# Patient Record
Sex: Female | Born: 1955 | Race: White | Hispanic: No | Marital: Single | Smoking: Light tobacco smoker
Health system: Southern US, Community
[De-identification: ages and names within clinical notes are randomized; demographics above are authoritative.]

## PROBLEM LIST (undated history)

## (undated) DIAGNOSIS — F32A Depression, unspecified: Secondary | ICD-10-CM

## (undated) DIAGNOSIS — M542 Cervicalgia: Secondary | ICD-10-CM

## (undated) DIAGNOSIS — R7301 Impaired fasting glucose: Secondary | ICD-10-CM

## (undated) DIAGNOSIS — R110 Nausea: Secondary | ICD-10-CM

## (undated) DIAGNOSIS — K219 Gastro-esophageal reflux disease without esophagitis: Secondary | ICD-10-CM

## (undated) DIAGNOSIS — J40 Bronchitis, not specified as acute or chronic: Secondary | ICD-10-CM

## (undated) DIAGNOSIS — E785 Hyperlipidemia, unspecified: Secondary | ICD-10-CM

## (undated) DIAGNOSIS — M8580 Other specified disorders of bone density and structure, unspecified site: Secondary | ICD-10-CM

## (undated) DIAGNOSIS — E669 Obesity, unspecified: Secondary | ICD-10-CM

## (undated) DIAGNOSIS — R079 Chest pain, unspecified: Secondary | ICD-10-CM

## (undated) DIAGNOSIS — Z720 Tobacco use: Secondary | ICD-10-CM

## (undated) DIAGNOSIS — I10 Essential (primary) hypertension: Secondary | ICD-10-CM

## (undated) DIAGNOSIS — F419 Anxiety disorder, unspecified: Secondary | ICD-10-CM

## (undated) DIAGNOSIS — J449 Chronic obstructive pulmonary disease, unspecified: Secondary | ICD-10-CM

## (undated) DIAGNOSIS — E039 Hypothyroidism, unspecified: Secondary | ICD-10-CM

## (undated) HISTORY — DX: Nausea: R11.0

## (undated) HISTORY — PX: ABDOMINAL HYSTERECTOMY: SHX81

## (undated) HISTORY — DX: Anxiety disorder, unspecified: F41.9

## (undated) HISTORY — DX: Chest pain, unspecified: R07.9

## (undated) HISTORY — DX: Cervicalgia: M54.2

## (undated) HISTORY — DX: Obesity, unspecified: E66.9

## (undated) HISTORY — DX: Gastro-esophageal reflux disease without esophagitis: K21.9

## (undated) HISTORY — DX: Impaired fasting glucose: R73.01

## (undated) HISTORY — DX: Other specified disorders of bone density and structure, unspecified site: M85.80

## (undated) HISTORY — PX: TUBAL LIGATION: SHX77

## (undated) HISTORY — DX: Tobacco use: Z72.0

## (undated) HISTORY — DX: Depression, unspecified: F32.A

## (undated) HISTORY — PX: OTHER SURGICAL HISTORY: SHX169

## (undated) HISTORY — DX: Hyperlipidemia, unspecified: E78.5

## (undated) HISTORY — DX: Hypothyroidism, unspecified: E03.9

---

## 1976-12-28 HISTORY — PX: BACK SURGERY: SHX140

## 1985-12-28 HISTORY — PX: OTHER SURGICAL HISTORY: SHX169

## 2002-06-28 ENCOUNTER — Emergency Department (HOSPITAL_COMMUNITY): Admission: EM | Admit: 2002-06-28 | Discharge: 2002-06-28 | Payer: Self-pay | Admitting: Emergency Medicine

## 2002-08-29 ENCOUNTER — Emergency Department (HOSPITAL_COMMUNITY): Admission: EM | Admit: 2002-08-29 | Discharge: 2002-08-29 | Payer: Self-pay | Admitting: Emergency Medicine

## 2003-06-20 ENCOUNTER — Emergency Department (HOSPITAL_COMMUNITY): Admission: EM | Admit: 2003-06-20 | Discharge: 2003-06-20 | Payer: Self-pay | Admitting: Emergency Medicine

## 2003-10-23 ENCOUNTER — Emergency Department (HOSPITAL_COMMUNITY): Admission: EM | Admit: 2003-10-23 | Discharge: 2003-10-23 | Payer: Self-pay | Admitting: Emergency Medicine

## 2004-03-02 ENCOUNTER — Emergency Department (HOSPITAL_COMMUNITY): Admission: EM | Admit: 2004-03-02 | Discharge: 2004-03-02 | Payer: Self-pay | Admitting: Emergency Medicine

## 2006-07-24 ENCOUNTER — Emergency Department (HOSPITAL_COMMUNITY): Admission: EM | Admit: 2006-07-24 | Discharge: 2006-07-24 | Payer: Self-pay | Admitting: Emergency Medicine

## 2006-12-07 ENCOUNTER — Emergency Department (HOSPITAL_COMMUNITY): Admission: EM | Admit: 2006-12-07 | Discharge: 2006-12-07 | Payer: Self-pay | Admitting: Emergency Medicine

## 2006-12-09 ENCOUNTER — Emergency Department (HOSPITAL_COMMUNITY): Admission: EM | Admit: 2006-12-09 | Discharge: 2006-12-09 | Payer: Self-pay | Admitting: Emergency Medicine

## 2007-07-28 ENCOUNTER — Emergency Department (HOSPITAL_COMMUNITY): Admission: EM | Admit: 2007-07-28 | Discharge: 2007-07-28 | Payer: Self-pay | Admitting: Emergency Medicine

## 2007-08-03 ENCOUNTER — Emergency Department (HOSPITAL_COMMUNITY): Admission: EM | Admit: 2007-08-03 | Discharge: 2007-08-03 | Payer: Self-pay | Admitting: Emergency Medicine

## 2008-03-30 ENCOUNTER — Emergency Department (HOSPITAL_COMMUNITY): Admission: EM | Admit: 2008-03-30 | Discharge: 2008-03-30 | Payer: Self-pay | Admitting: Emergency Medicine

## 2008-11-30 ENCOUNTER — Emergency Department (HOSPITAL_COMMUNITY): Admission: EM | Admit: 2008-11-30 | Discharge: 2008-11-30 | Payer: Self-pay | Admitting: Emergency Medicine

## 2010-01-25 ENCOUNTER — Emergency Department (HOSPITAL_COMMUNITY): Admission: EM | Admit: 2010-01-25 | Discharge: 2010-01-25 | Payer: Self-pay | Admitting: Emergency Medicine

## 2010-03-01 ENCOUNTER — Emergency Department (HOSPITAL_COMMUNITY): Admission: EM | Admit: 2010-03-01 | Discharge: 2010-03-01 | Payer: Self-pay | Admitting: Emergency Medicine

## 2011-07-02 ENCOUNTER — Emergency Department (HOSPITAL_COMMUNITY): Payer: Self-pay

## 2011-07-02 ENCOUNTER — Emergency Department (HOSPITAL_COMMUNITY)
Admission: EM | Admit: 2011-07-02 | Discharge: 2011-07-02 | Disposition: A | Discharge status: Home / Self Care | Payer: Self-pay | Attending: Emergency Medicine | Admitting: Emergency Medicine

## 2011-07-02 DIAGNOSIS — R05 Cough: Secondary | ICD-10-CM | POA: Insufficient documentation

## 2011-07-02 DIAGNOSIS — J40 Bronchitis, not specified as acute or chronic: Secondary | ICD-10-CM | POA: Insufficient documentation

## 2011-07-02 DIAGNOSIS — F17200 Nicotine dependence, unspecified, uncomplicated: Secondary | ICD-10-CM | POA: Insufficient documentation

## 2011-07-02 DIAGNOSIS — R059 Cough, unspecified: Secondary | ICD-10-CM | POA: Insufficient documentation

## 2011-07-02 DIAGNOSIS — M549 Dorsalgia, unspecified: Secondary | ICD-10-CM | POA: Insufficient documentation

## 2011-07-02 DIAGNOSIS — R0602 Shortness of breath: Secondary | ICD-10-CM | POA: Insufficient documentation

## 2011-09-22 LAB — URINALYSIS, ROUTINE W REFLEX MICROSCOPIC
Hgb urine dipstick: NEGATIVE
Ketones, ur: NEGATIVE
Nitrite: NEGATIVE
Protein, ur: NEGATIVE
Urobilinogen, UA: 0.2

## 2011-09-22 LAB — DIFFERENTIAL
Eosinophils Absolute: 0.2
Eosinophils Relative: 2
Lymphocytes Relative: 45
Lymphs Abs: 4.8 — ABNORMAL HIGH
Monocytes Absolute: 1.1 — ABNORMAL HIGH
Monocytes Relative: 10
Neutro Abs: 4.1

## 2011-09-22 LAB — CBC
MCHC: 35
MCV: 93
Platelets: 201
RDW: 14.6

## 2011-09-22 LAB — RAPID URINE DRUG SCREEN, HOSP PERFORMED
Amphetamines: NOT DETECTED
Barbiturates: NOT DETECTED
Benzodiazepines: NOT DETECTED

## 2011-09-22 LAB — COMPREHENSIVE METABOLIC PANEL
CO2: 22
Chloride: 106
Glucose, Bld: 84
Potassium: 3.8

## 2012-01-31 ENCOUNTER — Emergency Department (HOSPITAL_COMMUNITY)
Admission: EM | Admit: 2012-01-31 | Discharge: 2012-01-31 | Disposition: A | Discharge status: Home / Self Care | Payer: Self-pay | Attending: Emergency Medicine | Admitting: Emergency Medicine

## 2012-01-31 ENCOUNTER — Encounter (HOSPITAL_COMMUNITY): Payer: Self-pay | Admitting: *Deleted

## 2012-01-31 ENCOUNTER — Emergency Department (HOSPITAL_COMMUNITY): Payer: Self-pay

## 2012-01-31 ENCOUNTER — Other Ambulatory Visit: Payer: Self-pay

## 2012-01-31 DIAGNOSIS — R5381 Other malaise: Secondary | ICD-10-CM | POA: Insufficient documentation

## 2012-01-31 DIAGNOSIS — I10 Essential (primary) hypertension: Secondary | ICD-10-CM | POA: Insufficient documentation

## 2012-01-31 DIAGNOSIS — R079 Chest pain, unspecified: Secondary | ICD-10-CM | POA: Insufficient documentation

## 2012-01-31 HISTORY — DX: Bronchitis, not specified as acute or chronic: J40

## 2012-01-31 HISTORY — DX: Essential (primary) hypertension: I10

## 2012-01-31 LAB — BASIC METABOLIC PANEL
Chloride: 104 mEq/L (ref 96–112)
Creatinine, Ser: 0.72 mg/dL (ref 0.50–1.10)
GFR calc non Af Amer: >90 mL/min (ref >90)
Potassium: 4.6 mEq/L (ref 3.5–5.1)
Sodium: 137 mEq/L (ref 135–145)

## 2012-01-31 LAB — URINALYSIS, ROUTINE W REFLEX MICROSCOPIC
Bilirubin Urine: NEGATIVE
Leukocytes, UA: NEGATIVE
Protein, ur: NEGATIVE mg/dL
Urobilinogen, UA: 0.2 mg/dL (ref 0.0–1.0)
pH: 5.5 (ref 5.0–8.0)

## 2012-01-31 MED ORDER — LORAZEPAM 2 MG/ML IJ SOLN
1.0000 mg | Freq: Once | INTRAMUSCULAR | Status: AC
  Administered 2012-01-31: 1 mg via INTRAVENOUS
  Filled 2012-01-31: qty 1

## 2012-01-31 MED ORDER — HYDROCHLOROTHIAZIDE 25 MG PO TABS: 25.0000 mg | ORAL_TABLET | Freq: Every day | ORAL | Status: DC

## 2012-01-31 NOTE — ED Provider Notes (Signed)
History     CSN: [id number]  Arrival date & time 01/31/12  1303   First MD Initiated Contact with Patient 01/31/12 1321      Chief Complaint  Patient presents with  . Chest Pain  . Hypertension  . Nausea    HPI The patient presents with intermittent chest pain/heaviness and nausea for one week.  Her symptoms have been intermittent, occurring spontaneously, typically resolving spontaneously.  Symptoms are worse when she thinks about her symptoms, or checking her blood pressure.  He notes that yesterday she took her blood pressure during the middle of one episode, and found to be elevated.  Rather than come to the emergency department yesterday, she decided to avoid a crowd and comes today for evaluation.  On arrival the patient denies any significant complaints, voices concern over her episodes during the isthmic.  Heaviness was sternal, left chest, nonradiating.   Past Medical History  Diagnosis Date  . Hypertension   . Bronchitis     Past Surgical History  Procedure Date  . Back surgery   . Abdominal hysterectomy   . Tubal ligation   . Lumpectomy left breast   . Reconstructive surgeries on forehead     History reviewed. No pertinent family history.  History  Substance Use Topics  . Smoking status: Current Everyday Smoker -- 1.0 packs/day    Types: Cigarettes  . Smokeless tobacco: Never Used  . Alcohol Use: No    OB History    Grav Para Term Preterm Abortions TAB SAB Ect Mult Living                  Review of Systems  Constitutional:       HPI  HENT:       HPI otherwise negative  Eyes: Negative.   Respiratory:       HPI, otherwise negative  Cardiovascular:       HPI, otherwise nmegative  Gastrointestinal: Negative for vomiting.  Genitourinary:       HPI, otherwise negative  Musculoskeletal:       HPI, otherwise negative  Skin: Negative.   Neurological: Negative for syncope.  Psychiatric/Behavioral: Positive for sleep disturbance.    Allergies    Codeine and Aspirin  Home Medications   Current Outpatient Rx  Name Route Sig Dispense Refill  . ASPIRIN 81 MG PO CHEW Oral Chew 81 mg by mouth daily.    Marland Kitchen DIPHENHYDRAMINE HCL 25 MG PO TABS Oral Take 50 mg by mouth at bedtime as needed. For allergies.    . IBUPROFEN 200 MG PO TABS Oral Take 600 mg by mouth every 6 (six) hours as needed. For headache or back pain.    Marland Kitchen HYDROCHLOROTHIAZIDE 25 MG PO TABS Oral Take 1 tablet (25 mg total) by mouth daily. 30 tablet 0    BP 123/57  Pulse 85  Temp(Src) 98.1 F (36.7 C) (Oral)  Resp 20  Wt 205 lb (92.987 kg)  SpO2 97%  Physical Exam  Nursing note and vitals reviewed. Constitutional: She is oriented to person, place, and time. She appears well-developed and well-nourished. No distress.  HENT:  Head: Normocephalic and atraumatic.  Eyes: Conjunctivae and EOM are normal.  Cardiovascular: Normal rate and regular rhythm.   Pulmonary/Chest: Effort normal and breath sounds normal. No stridor. No respiratory distress.  Abdominal: She exhibits no distension.  Musculoskeletal: She exhibits no edema.  Neurological: She is alert and oriented to person, place, and time. No cranial nerve deficit.  Skin: Skin is  warm and dry.  Psychiatric: She has a normal mood and affect.    ED Course  Procedures (including critical care time)  Labs Reviewed  URINALYSIS, ROUTINE W REFLEX MICROSCOPIC - Abnormal; Notable for the following:    APPearance CLOUDY (*)    Hgb urine dipstick SMALL (*)    All other components within normal limits  URINE MICROSCOPIC-ADD ON - Abnormal; Notable for the following:    Squamous Epithelial / LPF FEW (*)    All other components within normal limits  BASIC METABOLIC PANEL  TROPONIN I   Dg Chest 2 View  01/31/2012  *RADIOLOGY REPORT*  Clinical Data: Chest pain, weakness  CHEST - 2 VIEW  Comparison: 07/02/2011  Findings: Mild biapical pleural thickening noted. Cardiomediastinal silhouette is within normal limits. The lungs  are clear. No pleural effusion.  No pneumothorax.  No acute osseous abnormality.  IMPRESSION: Normal chest.  Stable minimal biapical pleural thickening.  Original Report Authenticated By: Harrel Lemon, M.D.     1. Hypertension     X-ray reviewed by me Pulse oximetry 100% room air normal Cardiac monitor 90 sinus rhythm normal  Date: 01/31/2012  Rate: 91  Rhythm: normal sinus rhythm  QRS Axis: normal  Intervals: normal  ST/T Wave abnormalities: nonspecific ST changes  Conduction Disutrbances:none  Narrative Interpretation:   Old EKG Reviewed: none available  ABNORMAL ECG  MDM  This generally well-appearing female presents with concerns over elevated blood pressure and chest pain.  On exam she is in no distress, though her heart rate elevates when she talks about her blood pressure.  The patient's evaluation is reassuring for the low probability of this being an event of cardiac disease, given her risk factors consisting of solely cigarette smoking.  With 1 week of intermittent chest pain, true cardiac etiology would likely be reflected in a positive troponin.  Given the patient's blood pressure, she was started on a hydrochlorothiazide daily regimen.  Prolonged discussion was conducted with her regarding the necessity for primary care physician followup.  She was also provided resources to assist with her finding additional assistance with her prior medical issues.        Gerhard Munch, MD 01/31/12 [phone]

## 2012-01-31 NOTE — ED Notes (Signed)
[id number]<AV> Expected date:<BR> Expected time:<BR> Means of arrival:<BR> Comments:<BR> Hold for Triage 2.

## 2012-01-31 NOTE — ED Notes (Signed)
Pt from home c/o chest pain/heaviness and nausea off and on for about a week, [facility] and was elevated "which scared me too death" but pt reports not wanting to come to ED on a Saturday night so decided to wait until today. O2 at 4L/ applied, pt noted to be anxious with elevated blood pressure, EKG performed.

## 2012-01-31 NOTE — ED Notes (Signed)
Pt given discharge instructions and verb understanding, amb with steady gait to discharge window 

## 2012-02-28 ENCOUNTER — Encounter (HOSPITAL_COMMUNITY): Payer: Self-pay | Admitting: *Deleted

## 2012-02-28 ENCOUNTER — Emergency Department (INDEPENDENT_AMBULATORY_CARE_PROVIDER_SITE_OTHER)
Admission: EM | Admit: 2012-02-28 | Discharge: 2012-02-28 | Disposition: A | Discharge status: Home / Self Care | Payer: Self-pay | Source: Home / Self Care | Attending: Emergency Medicine | Admitting: Emergency Medicine

## 2012-02-28 DIAGNOSIS — Z760 Encounter for issue of repeat prescription: Secondary | ICD-10-CM

## 2012-02-28 MED ORDER — HYDROCHLOROTHIAZIDE 25 MG PO TABS: 25.0000 mg | ORAL_TABLET | Freq: Every day | ORAL | Status: DC

## 2012-02-28 NOTE — ED Notes (Signed)
States she is about to run out of her HCTZ; has appt w/ new PCP on 3/12 - requesting refill to get her through until then.

## 2012-02-28 NOTE — ED Provider Notes (Signed)
History     CSN: [id number]  Arrival date & time 02/28/12  1420   First MD Initiated Contact with Patient 02/28/12 1450      Chief Complaint  Patient presents with  . Medication Refill    (Consider location/radiation/quality/duration/timing/severity/associated sxs/prior treatment) HPI Comments: Patient presents for refill of her hydrochlorothiazide. Patient was seen in the ER if is not, redness of hypertension, and sent home on hydrochlorothiazide 25 mg once a day. [facility], and needs a refill until she can be seen. Patient states she's been doing well on this medication, and "feels better" overall since starting it. Patient also states she is trying to cut down her smoking. Patient currently without complaints.   The history is provided by the patient. No language interpreter was used.    Past Medical History  Diagnosis Date  . Hypertension   . Bronchitis     Past Surgical History  Procedure Date  . Back surgery   . Abdominal hysterectomy   . Tubal ligation   . Lumpectomy left breast   . Reconstructive surgeries on forehead     History reviewed. No pertinent family history.  History  Substance Use Topics  . Smoking status: Current Everyday Smoker -- 1.0 packs/day    Types: Cigarettes  . Smokeless tobacco: Never Used  . Alcohol Use: No    OB History    Grav Para Term Preterm Abortions TAB SAB Ect Mult Living                  Review of Systems  Constitutional: Negative for fever.  Respiratory: Negative for shortness of breath.   Cardiovascular: Negative for chest pain and leg swelling.  Gastrointestinal: Negative for nausea, vomiting and abdominal pain.  Genitourinary: Negative.   Skin: Negative for rash.  Neurological: Negative for weakness and headaches.    Allergies  Aspirin and Codeine  Home Medications   Current Outpatient Rx  Name Route Sig Dispense Refill  . ASPIRIN 81 MG PO CHEW  Oral Chew 81 mg by mouth daily.    Marland Kitchen HYDROCHLOROTHIAZIDE 25 MG PO TABS Oral Take 1 tablet (25 mg total) by mouth daily. 30 tablet 0    BP 123/73  Pulse 79  Temp(Src) 98.4 F (36.9 C) (Oral)  Resp 19  SpO2 98%  Physical Exam  Nursing note and vitals reviewed. Constitutional: She is oriented to person, place, and time. She appears well-developed and well-nourished. No distress.  HENT:  Head: Normocephalic and atraumatic.  Eyes: Conjunctivae and EOM are normal.  Neck: Normal range of motion.  Cardiovascular: Normal rate, regular rhythm, normal heart sounds and intact distal pulses.   Pulmonary/Chest: Effort normal and breath sounds normal.  Abdominal: She exhibits no distension.  Musculoskeletal: Normal range of motion.  Neurological: She is alert and oriented to person, place, and time.  Skin: Skin is warm and dry.  Psychiatric: She has a normal mood and affect. Her behavior is normal. Judgment and thought content normal.    ED Course  Procedures (including critical care time)  Labs Reviewed - No data to display No results found.   1. Medication refill       MDM  Patient, normotensive, asymptomatic. Has followup appointment already scheduled. Refilling HCTZ. Discussed when to return to the department. Patient agrees with plan     Luiz Blare, MD 02/28/12 [phone]

## 2012-03-08 ENCOUNTER — Encounter: Payer: Self-pay | Admitting: Family Medicine

## 2012-03-08 ENCOUNTER — Ambulatory Visit (INDEPENDENT_AMBULATORY_CARE_PROVIDER_SITE_OTHER): Payer: Self-pay | Admitting: Family Medicine

## 2012-03-08 VITALS — BP 142/78 | HR 80 | Temp 98.2°F | Ht 65.6 in | Wt 205.3 lb

## 2012-03-08 DIAGNOSIS — F411 Generalized anxiety disorder: Secondary | ICD-10-CM

## 2012-03-08 DIAGNOSIS — R079 Chest pain, unspecified: Secondary | ICD-10-CM | POA: Insufficient documentation

## 2012-03-08 DIAGNOSIS — F419 Anxiety disorder, unspecified: Secondary | ICD-10-CM | POA: Insufficient documentation

## 2012-03-08 DIAGNOSIS — I10 Essential (primary) hypertension: Secondary | ICD-10-CM

## 2012-03-08 DIAGNOSIS — Z1231 Encounter for screening mammogram for malignant neoplasm of breast: Secondary | ICD-10-CM

## 2012-03-08 DIAGNOSIS — Z1211 Encounter for screening for malignant neoplasm of colon: Secondary | ICD-10-CM

## 2012-03-08 DIAGNOSIS — Z23 Encounter for immunization: Secondary | ICD-10-CM

## 2012-03-08 DIAGNOSIS — R195 Other fecal abnormalities: Secondary | ICD-10-CM

## 2012-03-08 DIAGNOSIS — Z1239 Encounter for other screening for malignant neoplasm of breast: Secondary | ICD-10-CM

## 2012-03-08 LAB — LIPID PANEL
Cholesterol: 218 mg/dL — ABNORMAL HIGH (ref 0–200)
LDL Cholesterol: 136 mg/dL — ABNORMAL HIGH (ref 0–99)
Total CHOL/HDL Ratio: 3.9 Ratio
Triglycerides: 130 mg/dL (ref <150)
VLDL: 26 mg/dL (ref 0–40)

## 2012-03-08 LAB — COMPREHENSIVE METABOLIC PANEL
AST: 26 U/L (ref 0–37)
Alkaline Phosphatase: 85 U/L (ref 39–117)
Calcium: 9.2 mg/dL (ref 8.4–10.5)
Chloride: 104 mEq/L (ref 96–112)
Glucose, Bld: 113 mg/dL — ABNORMAL HIGH (ref 70–99)
Potassium: 4.3 mEq/L (ref 3.5–5.3)
Sodium: 138 mEq/L (ref 135–145)
Total Bilirubin: 0.4 mg/dL (ref 0.3–1.2)
Total Protein: 6.7 g/dL (ref 6.0–8.3)

## 2012-03-08 LAB — CBC
HCT: 43.8 % (ref 36.0–46.0)
RBC: 4.91 MIL/uL (ref 3.87–5.11)
WBC: 7.5 10*3/uL (ref 4.0–10.5)

## 2012-03-08 MED ORDER — PAROXETINE HCL 20 MG PO TABS: ORAL_TABLET | ORAL | Status: DC

## 2012-03-08 MED ORDER — HYDROCHLOROTHIAZIDE 25 MG PO TABS: 25.0000 mg | ORAL_TABLET | Freq: Every day | ORAL | Status: DC

## 2012-03-08 NOTE — Patient Instructions (Signed)
Medicine for anxiety prevention: Paroxetine 20 mg- take one tablet each morning  Do aerobic exercise daily-can help decrease stress and lower your risk for heart disease.  See info for scheduling mammogram  Follow-up in 2-3 weeks

## 2012-03-08 NOTE — Assessment & Plan Note (Signed)
Reviewed EKG from ER.  History suggestive of panic as etiology.  Given strong family history of CAD will work toward risk stratification and management. Will check fasting labwork today and will follow-up in 2-3 weeks.  Not currently symptomatic.  If continues to have chest pain would like to obtain stress testing (currently uninsured-working toward [employer] financial assistance)

## 2012-03-08 NOTE — Progress Notes (Signed)
  Subjective:    Patient ID: Shelly Frazier, female    DOB: [birth year], 56 y.o.   MRN: [id number]  HPIHere for new patient appointment.  Comes to establish care after noting BP is elevated and has become more increasingly worried about her strong family history of CAD.  HYPERTENSION  BP Readings from Last 3 Encounters:  03/08/12 158/83  02/28/12 123/73  01/31/12 123/57    Hypertension ROS: taking medications as instructed, no medication side effects noted, no chest pain on exertion, no dyspnea on exertion, no swelling of ankles, no intermittent claudication symptoms and no TIA&#39;s.   Chest pain:  Felt heaviness in chest 2 months ago, sometimes feels like "indigestion" and would go away with baking soda.  Is very anxious about heart disease as it runs in her family and when this occurs sometimes heart beats quickly and feels very panicky.  Does not occur on exertion.    subsequently she checked her blood pressure at the drugstore and it was high so went to ER.  Pain has resolved when she started on HCTZ.    Anxiety:  Reports has strong family history of depression.  She herself was treated for anxiety when her mother was ill with 6 months of xanax and lexapro.  Did help.  Would like something for acute treatment of panicked feeling. Review of Systems Patient Information Form: Screening and ROS  AUDIT-C Score: 0 Do you feel safe in relationships? yes PHQ-2:negative  Review of Symptoms  General:  Negative for nexplained weight loss, fever Skin: Negative for new or changing mole, sore that won't heal HEENT: Negative for trouble hearing, trouble seeing, ringing in ears, mouth sores, hoarseness, change in voice, dysphagia. CV:  Negative for dyspnea, edema Resp: Negative for cough, dyspnea, hemoptysis GI: Negative for nausea, vomiting, diarrhea, constipation, abdominal pain, melena, hematochezia. GU: Negative for dysuria, incontinence, urinary hesitance, hematuria, vaginal or penile  discharge, polyuria, sexual difficulty, lumps in testicle or breasts MSK: Negative for muscle cramps or aches, joint pain or swelling Neuro: Negative for headaches, weakness, numbness, dizziness, passing out/fainting Psych: Negative for depression, memory problems     Objective:   Physical Exam  GEN: Alert & Oriented, No acute distress Neck: supple, no LAD or thyromgealy CV:  Regular Rate & Rhythm, no murmur Respiratory:  Normal work of breathing, CTAB Abd:  + BS, soft, no tenderness to palpation Ext: no pre-tibial edema  EKG from ER reviewed:  NSR  PHQ9:1 GAD7: 10, somewhat difficult     Assessment & Plan:  Prevention:  Gave tdap today, patient declines flu vaccine. Gave stool cards to fill out Gave info and placed order for screening mammogram

## 2012-03-08 NOTE — Assessment & Plan Note (Signed)
Moderate to severe anxiety with strong family history of depression (both sisters).  Will start Paxil.  Advised that I did not recommend acute treatment of anxiety with xanax and will work to titrate SSRI to obtain long term control vs "quick fix"  She is agreeable and also plans to slowly increase walking to help as well and she is looking forward to a month long beach vacation with her sister.  Will follow-up in 2-3 weeks.

## 2012-03-08 NOTE — Assessment & Plan Note (Signed)
BP ok on recheck with appropriate sized cuff.  Given previous two blood pressure readings well within norms, will continue to monitor closely.  If elevated on follow-up or if has additional risk factors for CAD on labwork, will consider adding additional agent.

## 2012-03-09 ENCOUNTER — Encounter: Payer: Self-pay | Admitting: Family Medicine

## 2012-03-09 DIAGNOSIS — E785 Hyperlipidemia, unspecified: Secondary | ICD-10-CM | POA: Insufficient documentation

## 2012-03-09 DIAGNOSIS — R7301 Impaired fasting glucose: Secondary | ICD-10-CM | POA: Insufficient documentation

## 2012-03-29 ENCOUNTER — Telehealth: Payer: Self-pay | Admitting: *Deleted

## 2012-03-29 ENCOUNTER — Ambulatory Visit (INDEPENDENT_AMBULATORY_CARE_PROVIDER_SITE_OTHER): Payer: Self-pay | Admitting: Family Medicine

## 2012-03-29 ENCOUNTER — Encounter: Payer: Self-pay | Admitting: Family Medicine

## 2012-03-29 VITALS — BP 122/82 | HR 76 | Temp 98.9°F | Ht 65.0 in | Wt 206.0 lb

## 2012-03-29 DIAGNOSIS — F411 Generalized anxiety disorder: Secondary | ICD-10-CM

## 2012-03-29 DIAGNOSIS — E785 Hyperlipidemia, unspecified: Secondary | ICD-10-CM

## 2012-03-29 DIAGNOSIS — K219 Gastro-esophageal reflux disease without esophagitis: Secondary | ICD-10-CM

## 2012-03-29 DIAGNOSIS — F419 Anxiety disorder, unspecified: Secondary | ICD-10-CM

## 2012-03-29 DIAGNOSIS — R7301 Impaired fasting glucose: Secondary | ICD-10-CM | POA: Insufficient documentation

## 2012-03-29 LAB — POCT GLYCOSYLATED HEMOGLOBIN (HGB A1C): Hemoglobin A1C: 5.7

## 2012-03-29 MED ORDER — OMEPRAZOLE 20 MG PO CPDR: 20.0000 mg | DELAYED_RELEASE_CAPSULE | Freq: Every day | ORAL | Status: DC

## 2012-03-29 NOTE — Patient Instructions (Signed)
Will check your a1c to look for diabetes  Omeprazole as needed for heartburn  Follow-up in 6 months  Enjoy the beach!

## 2012-03-29 NOTE — Assessment & Plan Note (Signed)
Discussed goal of less than LDL<130, and risk modification to include exercise, weight management, continued smoking cessation.  Risk may change if she is diagnosed with DM.  For now, lifestyle management- offered referral for nutrition or smoking counseling- she declines.  Will follow-up in 6 months.

## 2012-03-29 NOTE — Telephone Encounter (Signed)
Pt called and informed of results. Pt also wanted to inform us that her Jaynee Eagles was approved and was told by Ms. Hill that all of this information is already in the system.Loralee [place]

## 2012-03-29 NOTE — Assessment & Plan Note (Signed)
rxed omeprazole prn for GERD.

## 2012-03-29 NOTE — Progress Notes (Signed)
  Subjective:    Patient ID: Shelly Frazier, female    DOB: [birth year], 56 y.o.   MRN: [id number]  HPI Here for 2-3 weeks follow-up for depression and to review labwork  Anxiety:  Was rxed Paxil.  Read on the Internet about side effects.  Most concerned about jitteriness and not feeling well so never started.  She further revealed that in the past she had been "tried on just about everything" by a psychiatrist and all medications made her feel bad.  States she is doing well managing on her own, only has episodic anxiety, and if not able to treat with episodic meds, does not want to take a daily preventive med.  HYPERTENSION  BP Readings from Last 3 Encounters:  03/29/12 122/82  03/08/12 142/78  02/28/12 123/73    Hypertension ROS: taking medications as instructed, no medication side effects noted, no chest pain on exertion, no dyspnea on exertion, no swelling of ankles and no intermittent claudication symptoms.  Home wrist monitor showing elevated blood pressure  Spitting up- since last seen several weeks ago, had several episodes of where she ate something and immediately regurgitated it.  No nausea.  Has burning sensation relieved by baking soda at times.  No pain.      Review of Systemssee HPI     Objective:   Physical Exam GEN: Alert & Oriented, No acute distress, talkative CV:  Regular Rate & Rhythm, no murmur Respiratory:  Normal work of breathing, CTAB Ext: no pre-tibial edema       Assessment & Plan:

## 2012-03-29 NOTE — Telephone Encounter (Signed)
Message copied by Deno Etienne on Tue Mar 29, 2012  7:17 PM ------      Message from: Macy Mis      Created: Tue Mar 29, 2012  9:40 AM       Please let carol know her a1c does not show diabetes

## 2012-03-29 NOTE — Assessment & Plan Note (Signed)
She has decided against preventive therapy for anxiety.  We discussed cons of episodic treatment including habit forming which is not a side effects of other options.  She declines therapy- stating she is "not much of a talker"  Discussed quality of life with symptoms vs risk of medical treatment.  Agreed to continue to monitor and if becomes worse, will return to discuss further.  Will continue to use exercise as relief.

## 2012-03-30 ENCOUNTER — Telehealth: Payer: Self-pay | Admitting: Family Medicine

## 2012-03-30 MED ORDER — HYDROCHLOROTHIAZIDE 25 MG PO TABS: 25.0000 mg | ORAL_TABLET | Freq: Every day | ORAL | Status: DC

## 2012-03-30 MED ORDER — RANITIDINE HCL 150 MG PO CAPS: 150.0000 mg | ORAL_CAPSULE | Freq: Two times a day (BID) | ORAL | Status: DC

## 2012-03-30 NOTE — Telephone Encounter (Signed)
i sent both her hCTZ and a new rx for acid medicine [facility]  She can get a 3 month supply for $12 on their plan if she chooses.

## 2012-03-30 NOTE — Telephone Encounter (Signed)
Ms. Buckles first need [facility]  [address].  But she cannot afford the omeprazole med that you prescribed.  Called to ask about price and even the over the counter version of Prilosec was more than she could pay.  Please give another rx for this med that would be within the $4 range.

## 2012-03-30 NOTE — Telephone Encounter (Signed)
Msg given to patient.  Please close encounter

## 2012-03-31 ENCOUNTER — Telehealth: Payer: Self-pay | Admitting: Family Medicine

## 2012-03-31 ENCOUNTER — Ambulatory Visit
Admission: RE | Admit: 2012-03-31 | Discharge: 2012-03-31 | Disposition: A | Discharge status: Home / Self Care | Payer: Self-pay | Source: Ambulatory Visit | Attending: Family Medicine | Admitting: Family Medicine

## 2012-03-31 DIAGNOSIS — Z1231 Encounter for screening mammogram for malignant neoplasm of breast: Secondary | ICD-10-CM

## 2012-03-31 LAB — HEMOCCULT GUIAC POC 1CARD (OFFICE)
Card #2 Fecal Occult Blod, POC: NEGATIVE
Card #3 Fecal Occult Blood, POC: NEGATIVE
Fecal Occult Blood, POC: POSITIVE

## 2012-03-31 NOTE — Telephone Encounter (Signed)
Will forward message to Dr. Briscoe. 

## 2012-03-31 NOTE — Telephone Encounter (Signed)
[facility] she can afford - wants to have Omeprazole called into [facility] - it is cheaper there.

## 2012-03-31 NOTE — Progress Notes (Signed)
Addended by: [place], Shernell Saldierna on: 03/31/2012 11:34 AM   Modules accepted: Orders

## 2012-04-01 MED ORDER — OMEPRAZOLE 20 MG PO CPDR: 20.0000 mg | DELAYED_RELEASE_CAPSULE | Freq: Every day | ORAL | Status: DC

## 2012-04-01 NOTE — Telephone Encounter (Signed)
Patient notified

## 2012-04-01 NOTE — Telephone Encounter (Signed)
Placed in fax pile. 

## 2012-04-05 DIAGNOSIS — R195 Other fecal abnormalities: Secondary | ICD-10-CM | POA: Insufficient documentation

## 2012-04-05 NOTE — Assessment & Plan Note (Signed)
Positive hemoccult- 1/3 on stools cards.  Discussed with patient- Uninsured-[facility] would be [facility] find an reduced fee facility.  Will ask staff to help initiate referral.  Patient wanted [place] to be aware that she will be out of town for month of May- may have limited cell phone reception at times.

## 2012-04-05 NOTE — Progress Notes (Signed)
Addended by: Macy Mis on: 04/05/2012 05:26 PM   Modules accepted: Orders

## 2012-09-28 ENCOUNTER — Ambulatory Visit (HOSPITAL_COMMUNITY)
Admission: RE | Admit: 2012-09-28 | Discharge: 2012-09-28 | Disposition: A | Discharge status: Home / Self Care | Payer: Self-pay | Source: Ambulatory Visit | Attending: Family Medicine | Admitting: Family Medicine

## 2012-09-28 ENCOUNTER — Encounter: Payer: Self-pay | Admitting: Family Medicine

## 2012-09-28 ENCOUNTER — Ambulatory Visit (INDEPENDENT_AMBULATORY_CARE_PROVIDER_SITE_OTHER): Payer: Self-pay | Admitting: Family Medicine

## 2012-09-28 VITALS — BP 155/72 | HR 76 | Ht 65.0 in | Wt 201.0 lb

## 2012-09-28 DIAGNOSIS — R079 Chest pain, unspecified: Secondary | ICD-10-CM

## 2012-09-28 DIAGNOSIS — K219 Gastro-esophageal reflux disease without esophagitis: Secondary | ICD-10-CM

## 2012-09-28 DIAGNOSIS — J441 Chronic obstructive pulmonary disease with (acute) exacerbation: Secondary | ICD-10-CM | POA: Insufficient documentation

## 2012-09-28 DIAGNOSIS — I10 Essential (primary) hypertension: Secondary | ICD-10-CM

## 2012-09-28 DIAGNOSIS — J449 Chronic obstructive pulmonary disease, unspecified: Secondary | ICD-10-CM | POA: Insufficient documentation

## 2012-09-28 DIAGNOSIS — E785 Hyperlipidemia, unspecified: Secondary | ICD-10-CM

## 2012-09-28 MED ORDER — CARVEDILOL 6.25 MG PO TABS: 6.2500 mg | ORAL_TABLET | Freq: Two times a day (BID) | ORAL | Status: DC

## 2012-09-28 MED ORDER — NITROGLYCERIN 0.4 MG SL SUBL: 0.4000 mg | SUBLINGUAL_TABLET | SUBLINGUAL | Status: DC | PRN

## 2012-09-28 MED ORDER — PRAVASTATIN SODIUM 40 MG PO TABS: 40.0000 mg | ORAL_TABLET | Freq: Every day | ORAL | Status: DC

## 2012-09-28 MED ORDER — OMEPRAZOLE 20 MG PO CPDR: 20.0000 mg | DELAYED_RELEASE_CAPSULE | Freq: Every day | ORAL | Status: AC

## 2012-09-28 NOTE — Patient Instructions (Addendum)
1-800-QUIT-NOW  Will set your up with cardiologist- let me know if you don't hear of an appt time  Take aspirin daily  Start cholesterol medicine- pravastatin  Start blood pressure medicine- carvedilol  If you have worsening chest pain- use nitroglycerin- if not resolved- call 911

## 2012-09-28 NOTE — Assessment & Plan Note (Addendum)
Chest pain, possibly cardiac in nature with strong family history.  EKG today wnl.  Will start carvedilol for elevated blood pressure.  Also will continue taking aspirin, and rxed nitroglycerin prn.  WIll refer to cardiology for stress testing.

## 2012-09-28 NOTE — Assessment & Plan Note (Signed)
Poorly co ntrolled, will rx coreg today

## 2012-09-28 NOTE — Assessment & Plan Note (Signed)
Refilled omeprazole.  discussed lifestyle modification to help as well

## 2012-09-28 NOTE — Assessment & Plan Note (Signed)
Above goal. She is agreeable to starting statin today.  Will follow-up in 3 months

## 2012-09-28 NOTE — Progress Notes (Signed)
  Subjective:    Patient ID: Shelly Frazier, female    DOB: May 21, 1956, 56 y.o.   MRN: [id number]  HPI Here for follow-up and at the end of visit mentions chest pain  GERD;  Had been well controlled for 3 months while she was on omperazole.  Then ran out, reflux ahas resumed.  Cannot identify certain food triggers.  continues to smoke.  Tobacco abuse:  Still smoking, has struggled to quit.  Is open to assistance.  Hypertension:  Elevated today, is taking hCTZ  Chest pain:  Notes has been occuring over the past few months.  Also notes she has had decreased exercise tolerance during this time frame- had been working on increasing daily walking but did not improve as she expected it to.  Chest pain is not always with exertion.  Described as central chest "cramping" that radiated to left arm.  Reports  Unsure if she has nausea with this or "if it is my nerves"  She is very concerned due to her mom's history of triple bypass in her early 27's.  Pain resolves on its own within a few minutes.  See HPi Review of Systems     Objective:   Physical Exam GEN: Alert & Oriented, No acute distress CV:  Regular Rate & Rhythm, no murmur Respiratory:  Normal work of breathing, CTAB Abd:  + BS, soft, no tenderness to palpation Ext: no pre-tibial edema  EKG: NSR rate 70.       Assessment & Plan:

## 2012-09-29 ENCOUNTER — Ambulatory Visit (INDEPENDENT_AMBULATORY_CARE_PROVIDER_SITE_OTHER): Payer: Self-pay | Admitting: Cardiovascular Disease

## 2012-09-29 ENCOUNTER — Encounter: Payer: Self-pay | Admitting: Cardiovascular Disease

## 2012-09-29 VITALS — BP 140/101 | HR 78 | Ht 65.0 in | Wt 201.0 lb

## 2012-09-29 DIAGNOSIS — R0989 Other specified symptoms and signs involving the circulatory and respiratory systems: Secondary | ICD-10-CM

## 2012-09-29 DIAGNOSIS — Z720 Tobacco use: Secondary | ICD-10-CM | POA: Insufficient documentation

## 2012-09-29 DIAGNOSIS — F17200 Nicotine dependence, unspecified, uncomplicated: Secondary | ICD-10-CM

## 2012-09-29 DIAGNOSIS — K219 Gastro-esophageal reflux disease without esophagitis: Secondary | ICD-10-CM

## 2012-09-29 DIAGNOSIS — R0609 Other forms of dyspnea: Secondary | ICD-10-CM

## 2012-09-29 DIAGNOSIS — R0600 Dyspnea, unspecified: Secondary | ICD-10-CM

## 2012-09-29 DIAGNOSIS — E785 Hyperlipidemia, unspecified: Secondary | ICD-10-CM

## 2012-09-29 DIAGNOSIS — I10 Essential (primary) hypertension: Secondary | ICD-10-CM

## 2012-09-29 DIAGNOSIS — R079 Chest pain, unspecified: Secondary | ICD-10-CM

## 2012-09-29 NOTE — Assessment & Plan Note (Signed)
Cholesterol is at goal.  Continue current dose of statin and diet Rx.  No myalgias or side effects.  F/U  LFT's in 6 months. Lab Results  Component Value Date   LDLCALC 136* 03/08/2012

## 2012-09-29 NOTE — Assessment & Plan Note (Signed)
Counseled for less than 10 minutes  She has been quit for last 2 weeks F/U Briscoe if Chantix needed

## 2012-09-29 NOTE — Assessment & Plan Note (Signed)
Well controlled.  Continue current medications and low sodium Dash type diet.    

## 2012-09-29 NOTE — Assessment & Plan Note (Signed)
H2 blocker per primary  Suspect this may be a source of pain along with anxiety

## 2012-09-29 NOTE — Patient Instructions (Addendum)
Your physician recommends that you schedule a follow-up appointment in: AS NEEDED Your physician recommends that you continue on your current medications as directed. Please refer to the Current Medication list given to you today. Your physician has requested that you have an echocardiogram. Echocardiography is a painless test that uses sound waves to create images of your heart. It provides your doctor with information about the size and shape of your heart and how well your heart's chambers and valves are working. This procedure takes approximately one hour. There are no restrictions for this procedure.  DX DYSPNEA Your physician has recommended that you have a pulmonary function test. Pulmonary Function Tests are a group of tests that measure how well air moves in and out of your lungs. PRE POST BRONCHIO DX DYSPBEA Your physician has requested that you have an exercise tolerance test. For further information please visit [email or web address] Please also follow instruction sheet, as given. DX DYSPNEA

## 2012-09-29 NOTE — Assessment & Plan Note (Signed)
Atypical with normal ECG F/U ETT  

## 2012-09-29 NOTE — Progress Notes (Signed)
Patient ID: Shelly Frazier, female   DOB: 1956-04-30, 56 y.o.   MRN: [id number] 55 yo with atypical chest pain and family history of CAD.  She also smokes.  Notes has been occuring over the past few months. Also notes she has had decreased exercise tolerance during this time frame- had been working on increasing daily walking but did not improve as she expected it to. Chest pain is not always with exertion. Described as central chest "cramping" that radiated to left arm. Reports Unsure if she has nausea with this or "if it is my nerves" She is very concerned due to her mom's history of triple bypass in her early 85's. Pain resolves on its own within a few minutes.  Some GI overtones with GERD and indigestion.    ROS: Denies fever, malais, weight loss, blurry vision, decreased visual acuity, cough, sputum, SOB, hemoptysis, pleuritic pain, palpitaitons, heartburn, abdominal pain, melena, lower extremity edema, claudication, or rash.  All other systems reviewed and negative   General: Affect appropriate Healthy:  appears stated age HEENT: normal Neck supple with no adenopathy JVP normal no bruits no thyromegaly Lungs clear with no wheezing and good diaphragmatic motion Heart:  S1/S2 no murmur,rub, gallop or click PMI normal Abdomen: benighn, BS positve, no tenderness, no AAA no bruit.  No HSM or HJR Distal pulses intact with no bruits No edema Neuro non-focal Skin warm and dry No muscular weakness  Medications Current Outpatient Prescriptions  Medication Sig Dispense Refill  . aspirin 81 MG tablet Take 81 mg by mouth daily.      . carvedilol (COREG) 6.25 MG tablet Take 1 tablet (6.25 mg total) by mouth 2 (two) times daily with a meal.  60 tablet  11  . hydrochlorothiazide (HYDRODIURIL) 25 MG tablet Take 1 tablet (25 mg total) by mouth daily.  90 tablet  3  . nitroGLYCERIN (NITROSTAT) 0.4 MG SL tablet Place 1 tablet (0.4 mg total) under the tongue every 5 (five) minutes as needed for chest  pain.  12 tablet  1  . omeprazole (PRILOSEC) 20 MG capsule Take 1 capsule (20 mg total) by mouth daily.  30 capsule  11  . pravastatin (PRAVACHOL) 40 MG tablet Take 1 tablet (40 mg total) by mouth daily.  30 tablet  11    Allergies Aspirin and Codeine  Family History: Family History  Problem Relation Age of Onset  . Cancer Mother 14    peritoneal cancer  . Heart disease Mother     bypass  . Hypertension Mother   . Cancer Father 14    lung ca  . Depression Sister   . Stroke Maternal Grandmother   . Heart disease Maternal Grandmother   . Heart disease Maternal Grandfather   . Heart disease Paternal Grandmother   . Heart disease Paternal Grandfather   . Diabetes Neg Hx   . Alcohol abuse Sister   . Liver disease Sister   . Depression Sister     Social History: History   Social History  . Marital Status: Divorced    Spouse Name: N/A    Number of Children: N/A  . Years of Education: N/A   Occupational History  . Not on file.   Social History Main Topics  . Smoking status: Current Every Day Smoker -- 1.0 packs/day    Types: Cigarettes  . Smokeless tobacco: Never Used  . Alcohol Use: No  . Drug Use: No  . Sexually Active: Not on file   Other Topics  Concern  . Not on file   Social History Narrative   Lives alone.  Divorced.  3 grown sons lives in the area.  Unemployed.  Worked previously in Regulatory affairs officer.  Graduated high school.    Electrocardiogram:  NSR rate 70 normal  09/28/12  Assessment and Plan

## 2012-10-05 ENCOUNTER — Ambulatory Visit (INDEPENDENT_AMBULATORY_CARE_PROVIDER_SITE_OTHER): Payer: Self-pay | Admitting: Internal Medicine

## 2012-10-05 ENCOUNTER — Ambulatory Visit (HOSPITAL_COMMUNITY): Payer: Self-pay | Attending: Cardiovascular Disease | Admitting: Radiology

## 2012-10-05 DIAGNOSIS — R0609 Other forms of dyspnea: Secondary | ICD-10-CM | POA: Insufficient documentation

## 2012-10-05 DIAGNOSIS — R0602 Shortness of breath: Secondary | ICD-10-CM

## 2012-10-05 DIAGNOSIS — R0989 Other specified symptoms and signs involving the circulatory and respiratory systems: Secondary | ICD-10-CM | POA: Insufficient documentation

## 2012-10-05 DIAGNOSIS — F17200 Nicotine dependence, unspecified, uncomplicated: Secondary | ICD-10-CM | POA: Insufficient documentation

## 2012-10-05 DIAGNOSIS — I10 Essential (primary) hypertension: Secondary | ICD-10-CM | POA: Insufficient documentation

## 2012-10-05 DIAGNOSIS — R0600 Dyspnea, unspecified: Secondary | ICD-10-CM

## 2012-10-05 DIAGNOSIS — Z8249 Family history of ischemic heart disease and other diseases of the circulatory system: Secondary | ICD-10-CM | POA: Insufficient documentation

## 2012-10-05 DIAGNOSIS — R072 Precordial pain: Secondary | ICD-10-CM | POA: Insufficient documentation

## 2012-10-05 LAB — PULMONARY FUNCTION TEST

## 2012-10-05 NOTE — Progress Notes (Signed)
PFT done today. 

## 2012-10-05 NOTE — Progress Notes (Signed)
Echocardiogram performed.  

## 2012-10-11 ENCOUNTER — Encounter: Payer: Self-pay | Admitting: Cardiovascular Disease

## 2012-10-12 ENCOUNTER — Telehealth: Payer: Self-pay | Admitting: *Deleted

## 2012-10-12 ENCOUNTER — Encounter: Payer: Self-pay | Admitting: Nurse Practitioner

## 2012-10-12 DIAGNOSIS — J449 Chronic obstructive pulmonary disease, unspecified: Secondary | ICD-10-CM

## 2012-10-12 DIAGNOSIS — R0602 Shortness of breath: Secondary | ICD-10-CM

## 2012-10-12 NOTE — Telephone Encounter (Signed)
GAVE PT PFT TEST PER DR NISHAN PT  NEEDS REFERRAL TO PULMONARY FOR  MOD COPD .Shelly Frazier

## 2012-10-31 ENCOUNTER — Ambulatory Visit (INDEPENDENT_AMBULATORY_CARE_PROVIDER_SITE_OTHER): Payer: Self-pay | Admitting: Nurse Practitioner

## 2012-10-31 ENCOUNTER — Encounter: Payer: Self-pay | Admitting: Nurse Practitioner

## 2012-10-31 VITALS — BP 136/82 | HR 83 | Wt 198.0 lb

## 2012-10-31 DIAGNOSIS — R079 Chest pain, unspecified: Secondary | ICD-10-CM

## 2012-10-31 DIAGNOSIS — R0600 Dyspnea, unspecified: Secondary | ICD-10-CM

## 2012-10-31 DIAGNOSIS — R0609 Other forms of dyspnea: Secondary | ICD-10-CM

## 2012-10-31 NOTE — Progress Notes (Signed)
Exercise Treadmill Test  Pre-Exercise Testing Evaluation Rhythm: normal sinus  Rate: 76   PR:  .16 QRS:  .09  QT:  .41 QTc: .46           Test  Exercise Tolerance Test Ordering MD: Charlton Haws, MD  Interpreting MD: Norma Fredrickson NP  Unique Test No: 1  Treadmill:  1  Indication for ETT: chest pain - rule out ischemia  Contraindication to ETT: No   Stress Modality: exercise - treadmill  Cardiac Imaging Performed: non   Protocol: standard Bruce - maximal  Max BP:  192/91  Max MPHR (bpm): 130 85% MPR (bpm):  N/A  MPHR obtained (bpm): 130   Reached 85% MPHR (min:sec):  N/A Total Exercise Time (min-sec):  1:50  Workload in METS:  4.2 Borg Scale: 17  Reason ETT Terminated:  patient's desire to stop    ST Segment Analysis At Rest: normal ST segments - no evidence of significant ST depression With Exercise: non-specific ST changes  Other Information Arrhythmia:  No Angina during ETT:  absent (0) Quality of ETT:  non-diagnostic  ETT Interpretation:  Non Diagnostic GXT  Comments: Patient presents today for routine GXT. Has had some atypical chest pain. Has multiple CV risk factors which include ongoing tobacco abuse, HTN, HLD on statin therapy and positive family history. Has had COPD recently diagnosed and has a visit with pulmonary on Friday with Dr. Craige Cotta.   She exercised today on the standard Bruce protocol. She was only able to exercise for 1:50. She has quite poor exercise tolerance. She did not meet her target of 138. Mildly hypertensive BP response. Clinically with fatigue and shortness of breath. No chest pain.  No ST depression to suggest ischemia but did have some mild ST flattening. No arrhythmia noted.   Recommendations: In light of the non diagnostic GXT and given her multitude of risk factors, Lexiscan is recommended. Smoking cessation is once again strongly encouraged.  Patient is agreeable to this plan and will call if any problems develop in the interim.

## 2012-11-04 ENCOUNTER — Institutional Professional Consult (permissible substitution): Payer: Self-pay | Admitting: Pulmonary Disease

## 2012-11-08 ENCOUNTER — Institutional Professional Consult (permissible substitution): Payer: Self-pay | Admitting: Pulmonary Disease

## 2012-11-08 ENCOUNTER — Encounter (HOSPITAL_COMMUNITY): Payer: Self-pay

## 2012-11-10 ENCOUNTER — Ambulatory Visit (HOSPITAL_COMMUNITY): Payer: Self-pay | Attending: Cardiovascular Disease | Admitting: Radiology

## 2012-11-10 VITALS — BP 113/68 | Ht 65.0 in | Wt 196.0 lb

## 2012-11-10 DIAGNOSIS — R112 Nausea with vomiting, unspecified: Secondary | ICD-10-CM

## 2012-11-10 DIAGNOSIS — I10 Essential (primary) hypertension: Secondary | ICD-10-CM | POA: Insufficient documentation

## 2012-11-10 DIAGNOSIS — J449 Chronic obstructive pulmonary disease, unspecified: Secondary | ICD-10-CM | POA: Insufficient documentation

## 2012-11-10 DIAGNOSIS — Z8249 Family history of ischemic heart disease and other diseases of the circulatory system: Secondary | ICD-10-CM | POA: Insufficient documentation

## 2012-11-10 DIAGNOSIS — R079 Chest pain, unspecified: Secondary | ICD-10-CM | POA: Insufficient documentation

## 2012-11-10 DIAGNOSIS — R0600 Dyspnea, unspecified: Secondary | ICD-10-CM

## 2012-11-10 DIAGNOSIS — R0602 Shortness of breath: Secondary | ICD-10-CM

## 2012-11-10 DIAGNOSIS — R000 Tachycardia, unspecified: Secondary | ICD-10-CM | POA: Insufficient documentation

## 2012-11-10 DIAGNOSIS — J4489 Other specified chronic obstructive pulmonary disease: Secondary | ICD-10-CM | POA: Insufficient documentation

## 2012-11-10 MED ORDER — AMINOPHYLLINE 25 MG/ML IV SOLN
75.0000 mg | Freq: Once | INTRAVENOUS | Status: AC
  Administered 2012-11-10: 75 mg via INTRAVENOUS

## 2012-11-10 MED ORDER — TECHNETIUM TC 99M SESTAMIBI GENERIC - CARDIOLITE
11.0000 | Freq: Once | INTRAVENOUS | Status: AC | PRN
  Administered 2012-11-10: 11 via INTRAVENOUS

## 2012-11-10 MED ORDER — TECHNETIUM TC 99M SESTAMIBI GENERIC - CARDIOLITE
33.0000 | Freq: Once | INTRAVENOUS | Status: AC | PRN
  Administered 2012-11-10: 33 via INTRAVENOUS

## 2012-11-10 MED ORDER — REGADENOSON 0.4 MG/5ML IV SOLN
0.4000 mg | Freq: Once | INTRAVENOUS | Status: AC
  Administered 2012-11-10: 0.4 mg via INTRAVENOUS

## 2012-11-10 NOTE — Progress Notes (Signed)
[facility] SITE 3 NUCLEAR MED [address] [id number] [place] [address] [phone]  Cardiology Nuclear Med Study  Shelly Frazier is a 56 y.o. female     MRN : [id number]     DOB: January 08, 1956  Procedure Date: 11/10/2012  Nuclear Med Background Indication for Stress Test:  Evaluation for Ischemia and 10/31/2012 Abnormal GXT Non Dx Non Specific changes History:  COPD and 10/05/12 ECHO: EF: 50-55%, 10/31/12 GXT poor exercise tolerance Cardiac Risk Factors: Family History - CAD, History of Smoking, Hypertension and Lipids  Symptoms:  Chest Pain and Rapid HR   Nuclear Pre-Procedure Caffeine/Decaff Intake:  None NPO After: 11:00pm   Lungs:  clear O2 Sat: 94% on room air. IV 0.9% NS with Angio Cath:  22g  IV Site: R Hand  IV Started by:  Milana Na, EMT-P  Chest Size (in):  42 Cup Size: D  Height: 5\' 5"  (1.651 m)  Weight:  196 lb (88.905 kg)  BMI:  Body mass index is 32.62 kg/(m^2). Tech Comments:  No Coreg in 12 last hours. This patient became very symptomatic with Lexiscan, nausea and vomiting. Aminophylline given 75 mg IV with total relief of symptoms.    Nuclear Med Study 1 or 2 day study: 1 day  Stress Test Type:  Eugenie Birks  Reading MD: Kristeen Miss, MD  Order Authorizing Provider:  P.Nishan  Resting Radionuclide: Technetium 70m Sestamibi  Resting Radionuclide Dose: 11.0 mCi   Stress Radionuclide:  Technetium 38m Sestamibi  Stress Radionuclide Dose: 32.9 mCi           Stress Protocol Rest HR: 69 Stress HR: 97  Rest BP: 113/68 Stress BP: 140/78  Exercise Time (min): n/a METS: n/a   Predicted Max HR: 162 bpm % Max HR: 59.88 bpm Rate Pressure Product: 03474   Dose of Adenosine (mg):  n/a Dose of Lexiscan: 0.4 mg  Dose of Atropine (mg): n/a Dose of Dobutamine: n/a mcg/kg/min (at max HR)  Stress Test Technologist: Frederick Peers, EMT-P  Nuclear Technologist:  Harlow Asa, CNMT     Rest Procedure:  Myocardial perfusion imaging was  performed at rest 45 minutes following the intravenous administration of Technetium 85m Sestamibi. Rest ECG: NSR - Normal EKG  Stress Procedure:  The patient received IV Lexiscan 0.4 mg over 15-seconds.  Technetium 35m Sestamibi injected at 30-seconds.  There were no significant changes with Lexiscan.  Quantitative spect images were obtained after a 45 minute delay. Stress ECG: No significant change from baseline ECG  QPS Raw Data Images:  Normal; no motion artifact; normal heart/lung ratio. Stress Images:  Normal homogeneous uptake in all areas of the myocardium. Rest Images:  Normal homogeneous uptake in all areas of the myocardium. Subtraction (SDS):  No evidence of ischemia. Transient Ischemic Dilatation (Normal <1.22):  1.12 Lung/Heart Ratio (Normal <0.45):  0.32  Quantitative Gated Spect Images QGS EDV:  71 ml QGS ESV:  18 ml  Impression Exercise Capacity:  Lexiscan with no exercise. BP Response:  Normal blood pressure response. Clinical Symptoms:  No significant symptoms noted. ECG Impression:  No significant ST segment change suggestive of ischemia. Comparison with Prior Nuclear Study: No images to compare  Overall Impression:  Normal stress nuclear study.  There is no evidence of ischemia.  LV Ejection Fraction: 74%.  LV Wall Motion:  NL LV Function; NL Wall Motion.    Shelly Frazier, Shelly Hageman., MD, Shelly Frazier 11/10/2012, 6:05 PM Office - [phone] Pager [phone]

## 2012-11-18 ENCOUNTER — Ambulatory Visit (INDEPENDENT_AMBULATORY_CARE_PROVIDER_SITE_OTHER)
Admission: RE | Admit: 2012-11-18 | Discharge: 2012-11-18 | Disposition: A | Discharge status: Home / Self Care | Payer: Self-pay | Source: Ambulatory Visit | Attending: Critical Care Medicine | Admitting: Critical Care Medicine

## 2012-11-18 ENCOUNTER — Ambulatory Visit (INDEPENDENT_AMBULATORY_CARE_PROVIDER_SITE_OTHER): Payer: Self-pay | Admitting: Critical Care Medicine

## 2012-11-18 ENCOUNTER — Encounter: Payer: Self-pay | Admitting: Critical Care Medicine

## 2012-11-18 VITALS — BP 122/68 | HR 70 | Temp 98.7°F | Ht 65.0 in | Wt 202.2 lb

## 2012-11-18 DIAGNOSIS — IMO0001 Reserved for inherently not codable concepts without codable children: Secondary | ICD-10-CM

## 2012-11-18 DIAGNOSIS — J449 Chronic obstructive pulmonary disease, unspecified: Secondary | ICD-10-CM

## 2012-11-18 MED ORDER — TIOTROPIUM BROMIDE MONOHYDRATE 18 MCG IN CAPS: 18.0000 ug | ORAL_CAPSULE | Freq: Every day | RESPIRATORY_TRACT | Status: DC

## 2012-11-18 NOTE — Progress Notes (Signed)
Quick Note:  Notify the patient that the Xray is stable and no cancer or other abnormalities No change in medications are recommended. Continue current meds as prescribed at last office visit ______

## 2012-11-18 NOTE — Assessment & Plan Note (Signed)
Chronic obstructive lung disease with associated bronchitis with frequent exacerbations gold stage B. Note chest x-ray shows no active process Patient does not desaturate with exertion on room air Notes spirometry shows mild upper airway obstruction and severe peripheral airflow obstruction with 25% improvement after bronchodilator lung volumes show moderate air trapping and diffusion capacity is mildly reduced Plan A referral to pulmonary rehab will be made Start Spiriva daily Consider attending [facility] Smoking cessation classes Return 3 months

## 2012-11-18 NOTE — Progress Notes (Signed)
Quick Note:  [id number] ______ 

## 2012-11-18 NOTE — Progress Notes (Signed)
Subjective:    Patient ID: Shelly Frazier, female    DOB: Sep 29, 1956, 56 y.o.   MRN: [id number]  HPI Comments: Hx of dyspnea worse with weight gain of 50# 4 years ago.  Pt out of work during this time, no exercise. Pt noted dyspnea across room. No smoking in 15 days (1-1.5 PPD 2yrs) Pt aves bronchitis 3-4 x per year 15 yrs ago, now only 1-2 x per year  Shortness of Breath This is a chronic problem. The current episode started more than 1 year ago. Episode frequency: exertional only: across room, up incline steps. The problem has been gradually worsening. Duration: Will recover in a few minutes. Associated symptoms include chest pain, rhinorrhea, sputum production and wheezing. Pertinent negatives include no abdominal pain, claudication, coryza, ear pain, fever, headaches, hemoptysis, leg pain, leg swelling, neck pain, orthopnea, PND, rash, sore throat, swollen glands, syncope or vomiting. The symptoms are aggravated by any activity, exercise and lying flat. Associated symptoms comments: Heart races with exertion with chest tightness. Risk factors include smoking. She has tried beta agonist inhalers for the symptoms. The treatment provided mild relief. Her past medical history is significant for COPD. There is no history of allergies, aspirin allergies, asthma, bronchiolitis, CAD, DVT, a heart failure, PE, pneumonia or a recent surgery.    CAT Score 11/18/2012  Total CAT Score 27    Past Medical History  Diagnosis Date  . Hypertension   . Bronchitis   . Anxiety   . Elevated fasting glucose   . GERD (gastroesophageal reflux disease)   . Chest pain   . Obesity   . Tobacco abuse   . Hyperlipemia      Family History  Problem Relation Age of Onset  . Cancer Mother 82    peritoneal cancer  . Heart disease Mother     bypass  . Hypertension Mother   . Cancer Father 18    lung ca  . Depression Sister   . Stroke Maternal Grandmother   . Heart disease Maternal Grandmother   . Heart  disease Maternal Grandfather   . Heart disease Paternal Grandmother   . Heart disease Paternal Grandfather   . Diabetes Neg Hx   . Alcohol abuse Sister   . Liver disease Sister   . Depression Sister      History   Social History  . Marital Status: Divorced    Spouse Name: N/A    Number of Children: 3  . Years of Education: N/A   Occupational History  .     Social History Main Topics  . Smoking status: Former Smoker -- 1.0 packs/day for 43 years    Types: Cigarettes    Quit date: 11/03/2012  . Smokeless tobacco: Never Used  . Alcohol Use: No  . Drug Use: No  . Sexually Active: Not Currently   Other Topics Concern  . Not on file   Social History Narrative   Lives alone.  Divorced.  3 grown sons lives in the area.  Unemployed.  Worked previously in Regulatory affairs officer.  Graduated high school.     Allergies  Allergen Reactions  . Naproxen     "horrible stomach ache"  . Aspirin Nausea Only  . Codeine Nausea Only     Outpatient Prescriptions Prior to Visit  Medication Sig Dispense Refill  . aspirin 81 MG tablet Take 81 mg by mouth daily.      . carvedilol (COREG) 6.25 MG tablet Take 1 tablet (6.25 mg total)  by mouth 2 (two) times daily with a meal.  60 tablet  11  . hydrochlorothiazide (HYDRODIURIL) 25 MG tablet Take 1 tablet (25 mg total) by mouth daily.  90 tablet  3  . nitroGLYCERIN (NITROSTAT) 0.4 MG SL tablet Place 1 tablet (0.4 mg total) under the tongue every 5 (five) minutes as needed for chest pain.  12 tablet  1  . omeprazole (PRILOSEC) 20 MG capsule Take 1 capsule (20 mg total) by mouth daily.  30 capsule  11  . pravastatin (PRAVACHOL) 40 MG tablet Take 1 tablet (40 mg total) by mouth daily.  30 tablet  11   Last reviewed on 11/18/2012  3:39 PM by Storm Frisk, MD     Review of Systems  Constitutional: Positive for fatigue. Negative for fever, chills, diaphoresis, activity change, appetite change and unexpected weight change.  HENT: Positive for  congestion, rhinorrhea and sinus pressure. Negative for hearing loss, ear pain, nosebleeds, sore throat, facial swelling, sneezing, mouth sores, trouble swallowing, neck pain, neck stiffness, dental problem, voice change, postnasal drip, tinnitus and ear discharge.   Eyes: Negative for photophobia, discharge, itching and visual disturbance.  Respiratory: Positive for cough, sputum production, shortness of breath and wheezing. Negative for apnea, hemoptysis, choking, chest tightness and stridor.   Cardiovascular: Positive for chest pain and palpitations. Negative for orthopnea, claudication, leg swelling, syncope and PND.  Gastrointestinal: Negative for nausea, vomiting, abdominal pain, constipation, blood in stool and abdominal distention.       Heartburn severe but better on PPI  Genitourinary: Negative for dysuria, urgency, frequency, hematuria, flank pain, decreased urine volume and difficulty urinating.  Musculoskeletal: Positive for back pain. Negative for myalgias, joint swelling, arthralgias and gait problem.  Skin: Negative for color change, pallor and rash.  Neurological: Negative for dizziness, tremors, seizures, syncope, speech difficulty, weakness, light-headedness, numbness and headaches.  Hematological: Negative for adenopathy. Does not bruise/bleed easily.  Psychiatric/Behavioral: Negative for confusion, sleep disturbance and agitation. The patient is nervous/anxious.        Objective:   Physical Exam Filed Vitals:   11/18/12 1523  BP: 122/68  Pulse: 70  Temp: 98.7 F (37.1 C)  TempSrc: Oral  Height: 5\' 5"  (1.651 m)  Weight: 202 lb 3.2 oz (91.717 kg)  SpO2: 96%    Gen: Pleasant, well-nourished, in no distress,  normal affect  ENT: No lesions,  mouth clear,  oropharynx clear, no postnasal drip  Neck: No JVD, no TMG, no carotid bruits  Lungs: No use of accessory muscles, no dullness to percussion, distant breath sounds  Cardiovascular: RRR, heart sounds normal, no  murmur or gallops, no peripheral edema  Abdomen: soft and NT, no HSM,  BS normal  Musculoskeletal: No deformities, no cyanosis or clubbing  Neuro: alert, non focal  Skin: Warm, no lesions or rashes  Dg Chest 2 View  11/18/2012  *RADIOLOGY REPORT*  Clinical Data: Hypertension.  COPD.  CHEST - 2 VIEW  Comparison: Plain films of the chest 01/31/2012 and 07/02/2011.  Findings: The lungs are clear.  Heart size is normal.  No pneumothorax or pleural fluid.  No focal bony abnormality. Mild apical scar is noted.  IMPRESSION: No acute disease.   Original Report Authenticated By: Holley Dexter, M.D.           Assessment & Plan:   COPD with bronchitis Gold B Chronic obstructive lung disease with associated bronchitis with frequent exacerbations gold stage B. Note chest x-ray shows no active process Patient does not desaturate with  exertion on room air Notes spirometry shows mild upper airway obstruction and severe peripheral airflow obstruction with 25% improvement after bronchodilator lung volumes show moderate air trapping and diffusion capacity is mildly reduced Plan A referral to pulmonary rehab will be made Start Spiriva daily Consider attending [facility] cessation classes Return 3 months    Updated Medication List Outpatient Encounter Prescriptions as of 11/18/2012  Medication Sig Dispense Refill  . aspirin 81 MG tablet Take 81 mg by mouth daily.      . carvedilol (COREG) 6.25 MG tablet Take 1 tablet (6.25 mg total) by mouth 2 (two) times daily with a meal.  60 tablet  11  . diphenhydrAMINE (BENADRYL) 25 MG tablet Take 25 mg by mouth every 6 (six) hours as needed.      . hydrochlorothiazide (HYDRODIURIL) 25 MG tablet Take 1 tablet (25 mg total) by mouth daily.  90 tablet  3  . ibuprofen (ADVIL,MOTRIN) 200 MG tablet Take 200 mg by mouth every 6 (six) hours as needed.      . nitroGLYCERIN (NITROSTAT) 0.4 MG SL tablet Place 1 tablet (0.4 mg total) under the tongue every  5 (five) minutes as needed for chest pain.  12 tablet  1  . omeprazole (PRILOSEC) 20 MG capsule Take 1 capsule (20 mg total) by mouth daily.  30 capsule  11  . pravastatin (PRAVACHOL) 40 MG tablet Take 1 tablet (40 mg total) by mouth daily.  30 tablet  11  . tiotropium (SPIRIVA HANDIHALER) 18 MCG inhalation capsule Place 1 capsule (18 mcg total) into inhaler and inhale daily.  30 capsule  11  . tiotropium (SPIRIVA) 18 MCG inhalation capsule Place 1 capsule (18 mcg total) into inhaler and inhale daily.  20 capsule  0

## 2012-11-18 NOTE — Patient Instructions (Addendum)
A referral to pulmonary rehab will be made Start Spiriva daily Consider attending [facility] Smoking cessation classes Chest xray today Return 3 months

## 2012-11-21 ENCOUNTER — Telehealth: Payer: Self-pay | Admitting: Critical Care Medicine

## 2012-11-21 MED ORDER — TIOTROPIUM BROMIDE MONOHYDRATE 18 MCG IN CAPS: 18.0000 ug | ORAL_CAPSULE | Freq: Every day | RESPIRATORY_TRACT | Status: DC

## 2012-11-21 NOTE — Telephone Encounter (Signed)
Notes Recorded by Storm Frisk, MD on 11/18/2012 at 4:49 PM Notify the patient that the Xray is stable and no cancer or other abnormalities No change in medications are recommended. Continue current meds as prescribed at last office visit  Pt advised of results. Carron Curie, CMA

## 2013-01-04 ENCOUNTER — Telehealth (HOSPITAL_COMMUNITY): Payer: Self-pay | Admitting: *Deleted

## 2013-01-04 NOTE — Telephone Encounter (Signed)
Telephone call today regarding referral to Pulmonary Rehab.  She will need financial assistance, forms will be mailed today.  Cathie Olden RN

## 2013-01-30 ENCOUNTER — Telehealth: Payer: Self-pay | Admitting: Critical Care Medicine

## 2013-01-30 NOTE — Telephone Encounter (Signed)
Attempted to call patient 3x to schedule a follow up appointment. No return call back. Sent letter 01/30/13 °

## 2013-03-28 ENCOUNTER — Encounter: Payer: Self-pay | Admitting: Family Medicine

## 2013-03-28 ENCOUNTER — Ambulatory Visit (INDEPENDENT_AMBULATORY_CARE_PROVIDER_SITE_OTHER): Payer: Self-pay | Admitting: Family Medicine

## 2013-03-28 VITALS — BP 138/78 | HR 84 | Temp 98.7°F | Ht 65.0 in | Wt 214.3 lb

## 2013-03-28 DIAGNOSIS — J449 Chronic obstructive pulmonary disease, unspecified: Secondary | ICD-10-CM

## 2013-03-28 DIAGNOSIS — F411 Generalized anxiety disorder: Secondary | ICD-10-CM

## 2013-03-28 DIAGNOSIS — IMO0001 Reserved for inherently not codable concepts without codable children: Secondary | ICD-10-CM

## 2013-03-28 DIAGNOSIS — F17200 Nicotine dependence, unspecified, uncomplicated: Secondary | ICD-10-CM

## 2013-03-28 DIAGNOSIS — E785 Hyperlipidemia, unspecified: Secondary | ICD-10-CM

## 2013-03-28 DIAGNOSIS — I10 Essential (primary) hypertension: Secondary | ICD-10-CM

## 2013-03-28 DIAGNOSIS — F419 Anxiety disorder, unspecified: Secondary | ICD-10-CM

## 2013-03-28 MED ORDER — HYDROCHLOROTHIAZIDE 25 MG PO TABS: 25.0000 mg | ORAL_TABLET | Freq: Every day | ORAL | Status: DC

## 2013-03-28 NOTE — Assessment & Plan Note (Signed)
Deteriorate, with symtpoms of panic attacks.  Reports lexapro, stimulants, and xanax has helped in the past.  Is reluctant to start medication therapy with antidepressant as she states many things have not worked for her in the past and she is concerned with the cost of adding another medication.  She is not interested in therapy or referral to [facility].  I declined to start stimulants or xanax.    Patient will think more on this and will follow-up in 3-4 weeks.  I declined writing medical justification to bypass pet policy in her apartment at this time.  I am concerned about financial burden as well as enabling avoidance of treatment.  She mentioned usuing the savings from quitting smoking as a motivation for stopping smoking.  Will follow-up in 2-3 weeks

## 2013-03-28 NOTE — Assessment & Plan Note (Signed)
Encouraged her to follow-up with pulmonology on their offer to help with medication assistance for spiriva.  Gave her info for [facility] MAP program as well for another option

## 2013-03-28 NOTE — Patient Instructions (Addendum)
Ask the Health Department if they have any Medication Assistance for Spiriva or Lexapro  I strongly recommend you consider treating your anxiety to improve your quality of life.  If you notice the red spots on the roof of your mouth don't go away, let me know  Wed discussed your goal of cutting back smoking and usuing the money you save to afford a pet- We have specialized smoking appointments with a skilled smoking pharmacist that can help you.  Just ask for an appointment  Follow-up in 2-3 weeks

## 2013-03-28 NOTE — Assessment & Plan Note (Signed)
Discussed role of tobacco in her fears of heart disease, COPD, and oral cancer.  She mentioned usuing the savings from quitting smoking as a motivation for stopping smoking.  Gave info for smoking cessation referraal. Will follow-up in 2-3 weeks

## 2013-03-28 NOTE — Progress Notes (Signed)
  Subjective:    Patient ID: Shelly Frazier, female    DOB: Sep 10, 1956, 57 y.o.   MRN: [id number]  HPI Here for routine follow-up of HTN. Has several other issues she would like to discuss.  HYPERTENSION  Has seen cardiology since out last visit, who referred her to pulmonology for exertional dyspnea.  BP Readings from Last 3 Encounters:  03/28/13 138/78  11/18/12 122/68  11/10/12 113/68    Hypertension ROS: taking medications as instructed, no medication side effects noted, no chest pain on exertion and no swelling of ankles.   HYPERLIPIDEMIA  Diet: Not following low cholesterol diet Exercise: No regular exercise  Patient reports weight gain,  Wt Readings from Last 3 Encounters:  03/28/13 214 lb 4.8 oz (97.206 kg)  11/18/12 202 lb 3.2 oz (91.717 kg)  11/10/12 196 lb (88.905 kg)   ROS:  Denies RUQ pain, myalgias, or symptoms or coronary ischemia Lab Results  Component Value Date   LDLCALC 136* 03/08/2012   Lab Results  Component Value Date   CHOL 218* 03/08/2012   Lab Results  Component Value Date   HDL 56 03/08/2012   Lab Results  Component Value Date   TRIG 130 03/08/2012   Lab Results  Component Value Date   ALT 25 03/08/2012   AST 26 03/08/2012   ALKPHOS 85 03/08/2012   BILITOT 0.4 03/08/2012   Anxiety;  Reports feeling very worried about multiple issues.  Wakes up with her heart racing and worries it is her heart.  Requests medical letter to keep a cat in her apartment- states otherwise cannot afford it due to costs to register a pet with housing complex.   Exertional dyspnea: Worse with worsening weight gain.  Was seen by pulmonology for this who recommended spiriva and pulmonary rehab.  She did not call back to their requests to follow-up.  They also offered her medication assistance for spiriva with proof of low income which she has not had the chance to obtain yet.  Palate lesion;  States 2 years ago was told she needed to have a lesion on her palate biopsied.   Did not get biopsy due to cost.  She described this as a red circle.  Has been tearful worrying about it being malignant.  She is a smoker.  Has not looked at it.  Wears dentures 24 hours per day.       Review of Systems See above    Objective:   Physical Exam  GEN: Alert & Oriented, No acute distress Palate:  Dentures removed.  No gum, soft tissue. Or tongue lesions.  Some scattered redness on hard palate but no discrete lesion.  No pain on palpation. CV:  Regular Rate & Rhythm, no murmur Respiratory:  Normal work of breathing, CTAB Abd:  + BS, soft, no tenderness to palpation Ext: no pre-tibial edema Psych:  Anxious, crying.         Assessment & Plan:

## 2013-03-28 NOTE — Assessment & Plan Note (Signed)
At goal.  

## 2013-03-29 ENCOUNTER — Other Ambulatory Visit: Payer: Self-pay

## 2013-03-29 DIAGNOSIS — E785 Hyperlipidemia, unspecified: Secondary | ICD-10-CM

## 2013-03-29 LAB — LIPID PANEL
Cholesterol: 192 mg/dL (ref 0–200)
Triglycerides: 174 mg/dL — ABNORMAL HIGH (ref <150)
VLDL: 35 mg/dL (ref 0–40)

## 2013-03-29 LAB — COMPREHENSIVE METABOLIC PANEL
ALT: 19 U/L (ref 0–35)
CO2: 27 mEq/L (ref 19–32)
Calcium: 9.6 mg/dL (ref 8.4–10.5)
Chloride: 104 mEq/L (ref 96–112)
Glucose, Bld: 89 mg/dL (ref 70–99)
Sodium: 140 mEq/L (ref 135–145)
Total Bilirubin: 0.5 mg/dL (ref 0.3–1.2)
Total Protein: 6.7 g/dL (ref 6.0–8.3)

## 2013-03-29 NOTE — Progress Notes (Signed)
CMP AND FLP DONE TODAY Shelly Frazier 

## 2013-03-30 ENCOUNTER — Encounter: Payer: Self-pay | Admitting: Family Medicine

## 2013-04-11 ENCOUNTER — Ambulatory Visit: Payer: Self-pay | Admitting: Family Medicine

## 2013-04-17 ENCOUNTER — Ambulatory Visit: Payer: Self-pay | Admitting: Family Medicine

## 2013-04-18 ENCOUNTER — Encounter: Payer: Self-pay | Admitting: Family Medicine

## 2013-04-18 ENCOUNTER — Ambulatory Visit (INDEPENDENT_AMBULATORY_CARE_PROVIDER_SITE_OTHER): Payer: No Typology Code available for payment source | Admitting: Family Medicine

## 2013-04-18 VITALS — BP 137/81 | HR 93 | Ht 65.0 in | Wt 209.0 lb

## 2013-04-18 DIAGNOSIS — F411 Generalized anxiety disorder: Secondary | ICD-10-CM

## 2013-04-18 DIAGNOSIS — F419 Anxiety disorder, unspecified: Secondary | ICD-10-CM

## 2013-04-18 MED ORDER — VENLAFAXINE HCL ER 37.5 MG PO CP24: 37.5000 mg | ORAL_CAPSULE | Freq: Every day | ORAL | Status: DC

## 2013-04-18 NOTE — Assessment & Plan Note (Signed)
Will start effexor 37,5, titrate up to 75 mg in 1 week.  Will follow-up in 3-4 weeks for further monitoring and dose titration.

## 2013-04-18 NOTE — Progress Notes (Signed)
  Subjective:    Patient ID: Shelly Frazier, female    DOB: Jun 25, 1956, 57 y.o.   MRN: [id number]  HPI Here for follow-up of anxiety and palate concerns-   Anxiety:  Here to discuss- states is doing ok- Has thought about it and would like to start medications.  Has done some research and the lexapro she used to take is not covered by [facility]  Palate:   Has not noticed any change or pain.  Continues to wear her dentures 24 hours per day  Review of Systemssee HPI     Objective:   Physical Exam GEN: Alert & Oriented, No acute distress Mouth;  Palate without lesion CV:  Regular Rate & Rhythm, no murmur Psych: euthymic, sad affect        Assessment & Plan:  Palate concern:  Reassured her I did not see the red, circular dime sized lesion she said was described to her by a dentist several years ago as needing biopsy.  Advised her to aconitine monitoring for symptoms but no lesion to be biopsied visible today.

## 2013-04-18 NOTE — Patient Instructions (Addendum)
Will start Effexor at a very low dose.  After taking for one week, increase to 2 tablets a day.  Follow-up in 4 weeks

## 2013-05-19 ENCOUNTER — Ambulatory Visit: Payer: Self-pay | Admitting: Family Medicine

## 2013-05-23 ENCOUNTER — Encounter: Payer: Self-pay | Admitting: Family Medicine

## 2013-05-23 ENCOUNTER — Ambulatory Visit (INDEPENDENT_AMBULATORY_CARE_PROVIDER_SITE_OTHER): Payer: Self-pay | Admitting: Family Medicine

## 2013-05-23 VITALS — BP 135/78 | HR 81 | Temp 98.4°F | Ht 65.0 in | Wt 208.0 lb

## 2013-05-23 DIAGNOSIS — F419 Anxiety disorder, unspecified: Secondary | ICD-10-CM

## 2013-05-23 DIAGNOSIS — K5900 Constipation, unspecified: Secondary | ICD-10-CM | POA: Insufficient documentation

## 2013-05-23 DIAGNOSIS — F411 Generalized anxiety disorder: Secondary | ICD-10-CM

## 2013-05-23 DIAGNOSIS — IMO0002 Reserved for concepts with insufficient information to code with codable children: Secondary | ICD-10-CM

## 2013-05-23 DIAGNOSIS — F99 Mental disorder, not otherwise specified: Secondary | ICD-10-CM

## 2013-05-23 MED ORDER — DOCUSATE SODIUM 100 MG PO CAPS: 100.0000 mg | ORAL_CAPSULE | Freq: Two times a day (BID) | ORAL | Status: DC

## 2013-05-23 NOTE — Progress Notes (Signed)
Subjective:     Patient ID: Shelly Frazier, female   DOB: 07-09-1956, 57 y.o.   MRN: [id number]  HPI Anxiety:takes Effexor 37.5 mg 2 tab at night since she started med she feel better,but occasionally still feels like her heart is racing. She does have sleep problem for which she takes benadryl prn at night,she denies feeling of agitation and confusion,no feeling of hurting herself or someone else. Constipated: 13month ago since she started Effexor,she normally moves her bowel daily but now once a wks,she denies blood in her stool,no bloating,yesterday and 2 day she moved her bowel. No pain no N/V.Colonoscopy last year was normal. Pet letter:Requesting letter for her apartment administrative for her to get a pet. She need a pet for emotional support and companionship,she had pet in the past which helped her feel stable she feels getting another animal or a cat will make her feel better. She need a letter for her to get pet in her apartment.She stated she discussed this with Dr Earnest Bailey who planned on writing her a letter.  Current Outpatient Prescriptions on File Prior to Visit  Medication Sig Dispense Refill  . aspirin 81 MG tablet Take 81 mg by mouth daily.      . carvedilol (COREG) 6.25 MG tablet Take 1 tablet (6.25 mg total) by mouth 2 (two) times daily with a meal.  60 tablet  11  . diphenhydrAMINE (BENADRYL) 25 MG tablet Take 25 mg by mouth every 6 (six) hours as needed.      . hydrochlorothiazide (HYDRODIURIL) 25 MG tablet Take 1 tablet (25 mg total) by mouth daily.  90 tablet  3  . ibuprofen (ADVIL,MOTRIN) 200 MG tablet Take 200 mg by mouth every 6 (six) hours as needed.      Marland Kitchen omeprazole (PRILOSEC) 20 MG capsule Take 1 capsule (20 mg total) by mouth daily.  30 capsule  11  . pravastatin (PRAVACHOL) 40 MG tablet Take 1 tablet (40 mg total) by mouth daily.  30 tablet  11  . tiotropium (SPIRIVA HANDIHALER) 18 MCG inhalation capsule Place 1 capsule (18 mcg total) into inhaler and inhale daily.  30  capsule  11  . venlafaxine XR (EFFEXOR XR) 37.5 MG 24 hr capsule Take 1 capsule (37.5 mg total) by mouth daily. For 1 week, then increase to two tablets daily  60 capsule  1  . nitroGLYCERIN (NITROSTAT) 0.4 MG SL tablet Place 1 tablet (0.4 mg total) under the tongue every 5 (five) minutes as needed for chest pain.  12 tablet  1  . tiotropium (SPIRIVA) 18 MCG inhalation capsule Place 1 capsule (18 mcg total) into inhaler and inhale daily.  20 capsule  0   No current facility-administered medications on file prior to visit.   Past Medical History  Diagnosis Date  . Hypertension   . Bronchitis   . Anxiety   . Elevated fasting glucose   . GERD (gastroesophageal reflux disease)   . Chest pain   . Obesity   . Tobacco abuse   . Hyperlipemia       Review of Systems  Respiratory: Negative.   Cardiovascular: Negative.   Gastrointestinal: Positive for constipation. Negative for nausea, vomiting, abdominal pain, diarrhea, blood in stool and anal bleeding.  Genitourinary: Negative.   Psychiatric/Behavioral: Positive for sleep disturbance. Negative for suicidal ideas, behavioral problems, confusion, self-injury and agitation. The patient is nervous/anxious.   All other systems reviewed and are negative.   Filed Vitals:   05/23/13 1124  BP: 135/78  Pulse: 81  Temp: 98.4 F (36.9 C)  TempSrc: Oral  Height: 5\' 5"  (1.651 m)  Weight: 208 lb (94.348 kg)       Objective:   Physical Exam  Nursing note and vitals reviewed. Constitutional: She appears well-developed. No distress.  Cardiovascular: Normal rate, regular rhythm and normal heart sounds.   No murmur heard. Pulmonary/Chest: Breath sounds normal. No respiratory distress. She has no wheezes.  Abdominal: Soft. Bowel sounds are normal. She exhibits no distension and no mass. There is no tenderness. There is no rebound and no guarding.  Musculoskeletal: Normal range of motion. She exhibits no edema.  Psychiatric: She has a normal  mood and affect. Her behavior is normal. Judgment and thought content normal. She expresses no homicidal and no suicidal ideation.  Emotionally stable       Assessment:     Anxiety: Improved on medication. Constipation: Medication induced due to Effexor Emotional support:  for pet support     Plan:     1. Continue Effexor 75 mg qd for the next 4 wks if no complete improvement will consider increasing her dose.      2. Colace 100mg  BID prescribed.  3. I reviewed Dr Leonie Green note about giving medical justification to bypass pet policy in her apartment,she had declined giving her this letter as it might affect her compliance with medical treatment,I discussed this with patient,she agreed to continue her medication for now,if no improvement I will consider writing her this letter at next visit.    Patient agreed with plan,we will revisit this during her next visit.

## 2013-05-23 NOTE — Assessment & Plan Note (Signed)
Anxiety: Improved on medication. Continue Effexor 75 mg qd for the next 4 wks if no complete improvement will consider increasing her dose.

## 2013-05-23 NOTE — Assessment & Plan Note (Signed)
  Constipation: Medication induced due to Effexor  Colace 100mg  BID prescribed.

## 2013-05-23 NOTE — Assessment & Plan Note (Signed)
  Emotional support:  for pet support, currently she is emotionally stable.     I reviewed Dr Leonie Green note about giving medical justification to bypass pet policy in her apartment,she had declined giving her this letter as it might affect her compliance with medical treatment,I discussed this with patient,she agreed to continue her medication for now,if no improvement I will consider writing her this letter at next visit.    Patient agreed with plan,we will revisit this during her next visit.

## 2013-05-23 NOTE — Patient Instructions (Signed)
  Thank you for following up for your anxiety,I am gald to k now you have improved with your symptoms,let us continue Effexor 37.5 2 tab daily for the next four week,if no complete improvement I will increase your medication. Also Effexor do cause constipation hence I will start you on colace.   Anxiety and Panic Attacks Anxiety is your body's way of reacting to real danger or something you think is a danger. It may be fear or worry over a situation like losing your job. Sometimes the cause is not known. A panic attack is made up of physical signs like sweating, shaking, or chest pain. Anxiety and panic attacks may start suddenly. They may be strong. They may come at any time of day, even while sleeping. They may come at any time of life. Panic attacks are scary, but they do not harm you physically.  HOME CARE  Avoid any known causes of your anxiety.  Try to relax. Yoga may help. Tell yourself everything will be okay.  Exercise often.  Get expert advice and help (therapy) to stop anxiety or attacks from happening.  Avoid caffeine, alcohol, and drugs.  Only take medicine as told by your doctor. GET HELP RIGHT AWAY IF:  Your attacks seem different than normal attacks.  Your problems are getting worse or concern you. MAKE SURE YOU:  Understand these instructions.  Will watch your condition.  Will get help right away if you are not doing well or get worse. Document Released: 01/16/2011 Document Revised: 03/07/2012 Document Reviewed: 01/16/2011 [facility] Patient Information 2014 [place], [place].

## 2013-05-29 ENCOUNTER — Other Ambulatory Visit: Payer: Self-pay | Admitting: Family Medicine

## 2013-05-29 MED ORDER — TIOTROPIUM BROMIDE MONOHYDRATE 18 MCG IN CAPS: 18.0000 ug | ORAL_CAPSULE | Freq: Every day | RESPIRATORY_TRACT | Status: DC

## 2013-06-17 IMAGING — CR DG CHEST 2V
2 series · 2 of 2 positions shown · non-contrast
Comparison: 07/02/2011

CLINICAL DATA: Chest pain, weakness

CHEST - 2 VIEW

[w chest pa]
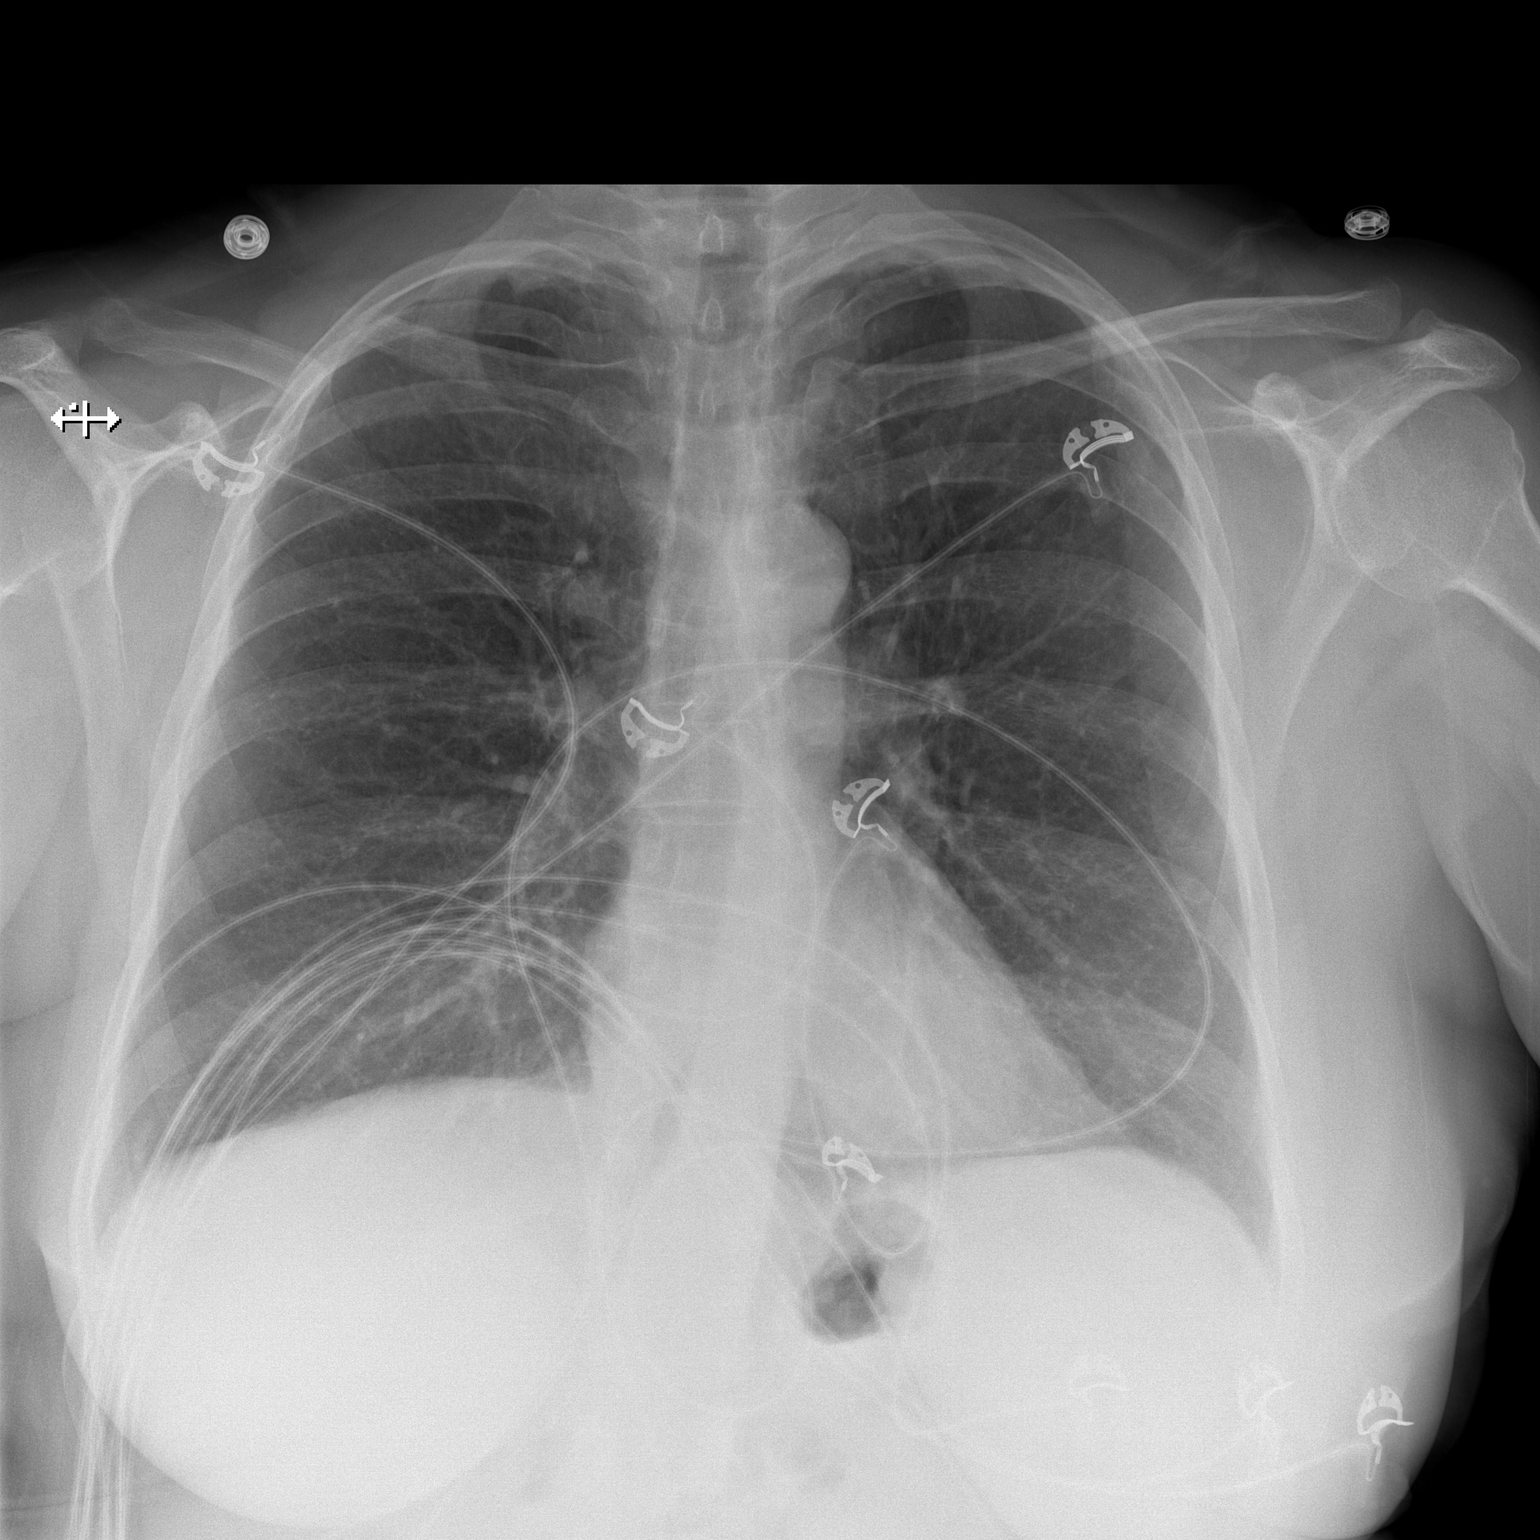

[w chest lat]
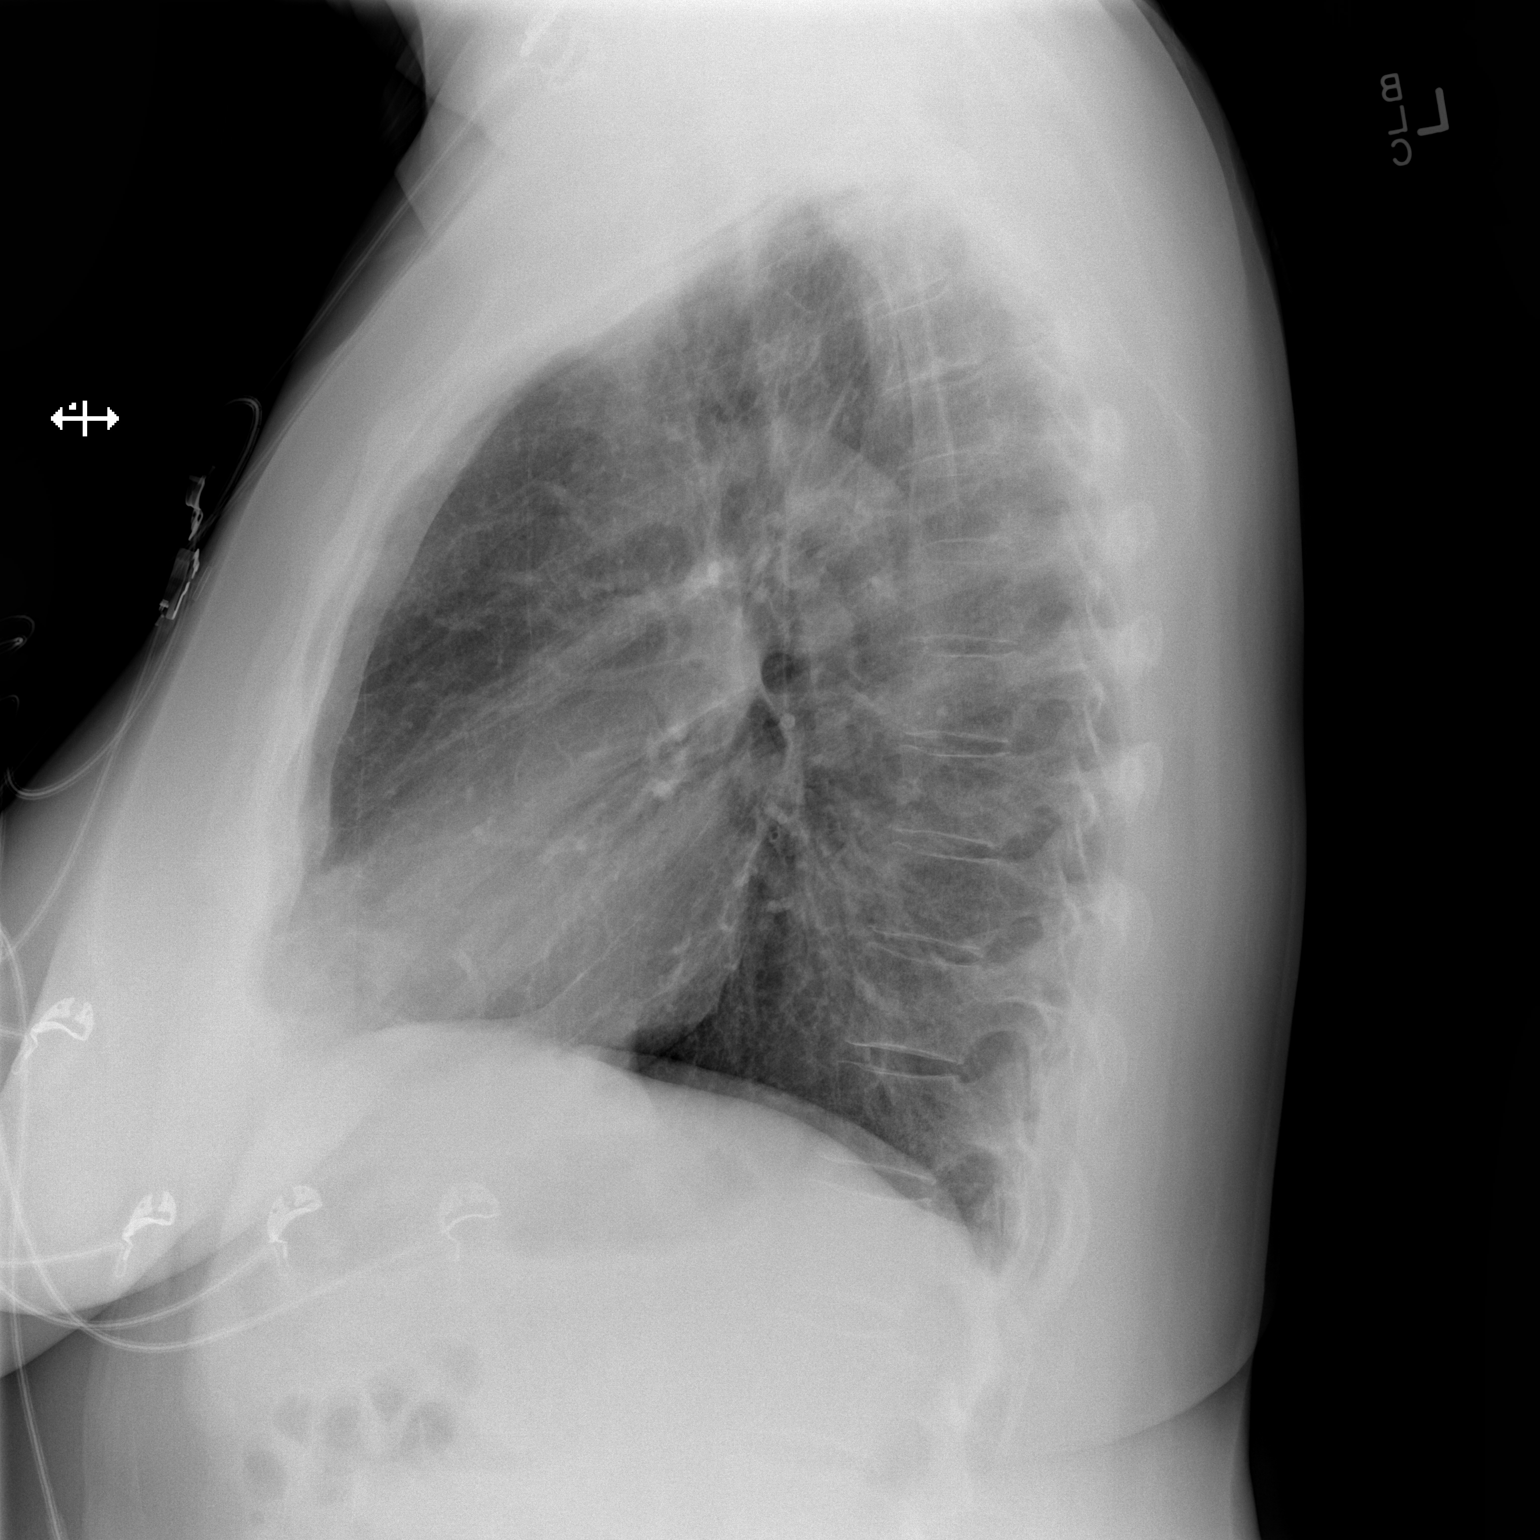

[2 of 2 positions shown; findings below may reference images not displayed]

FINDINGS: Mild biapical pleural thickening noted. Cardiomediastinal
silhouette is within normal limits. The lungs are clear. No pleural
effusion.  No pneumothorax.  No acute osseous abnormality.
IMPRESSION: Normal chest.  Stable minimal biapical pleural thickening.

## 2013-06-22 ENCOUNTER — Ambulatory Visit (INDEPENDENT_AMBULATORY_CARE_PROVIDER_SITE_OTHER): Payer: No Typology Code available for payment source | Admitting: Family Medicine

## 2013-06-22 ENCOUNTER — Encounter: Payer: Self-pay | Admitting: Family Medicine

## 2013-06-22 VITALS — BP 125/81 | HR 76 | Temp 98.0°F | Wt 205.0 lb

## 2013-06-22 DIAGNOSIS — F419 Anxiety disorder, unspecified: Secondary | ICD-10-CM

## 2013-06-22 DIAGNOSIS — F411 Generalized anxiety disorder: Secondary | ICD-10-CM

## 2013-06-22 DIAGNOSIS — F17210 Nicotine dependence, cigarettes, uncomplicated: Secondary | ICD-10-CM | POA: Insufficient documentation

## 2013-06-22 DIAGNOSIS — F17200 Nicotine dependence, unspecified, uncomplicated: Secondary | ICD-10-CM

## 2013-06-22 DIAGNOSIS — Z1239 Encounter for other screening for malignant neoplasm of breast: Secondary | ICD-10-CM

## 2013-06-22 DIAGNOSIS — E785 Hyperlipidemia, unspecified: Secondary | ICD-10-CM

## 2013-06-22 DIAGNOSIS — I10 Essential (primary) hypertension: Secondary | ICD-10-CM

## 2013-06-22 DIAGNOSIS — K219 Gastro-esophageal reflux disease without esophagitis: Secondary | ICD-10-CM

## 2013-06-22 MED ORDER — ESOMEPRAZOLE MAGNESIUM 20 MG PO CPDR: 20.0000 mg | DELAYED_RELEASE_CAPSULE | Freq: Every day | ORAL | Status: DC

## 2013-06-22 NOTE — Assessment & Plan Note (Signed)
I started her on Nexium 20 mg qd.

## 2013-06-22 NOTE — Patient Instructions (Addendum)
Mammogram A mammogram is an X-ray test to find changes in a woman's breast. You should get a mammogram if:  You are over 59 years of age.   You have risk factors.   Your doctor recommends that you have one.  BEFORE THE TEST  Do not schedule the test the week before your period, especially if your breasts are sore during this time.  On the day of your mammogram:  Wash your breasts and armpits well. After washing, do not put on any deodorant or talcum powder on until after your test.   Eat and drink as you usually do.   Take your medicines as usual.   If you are diabetic and take insulin, make sure you:   Eat before coming for your test.   Take your insulin as usual.   If you cannot keep your appointment, call before the appointment to cancel. Schedule another appointment.  TEST  You will need to undress from the waist up. You will put on a hospital gown.   Your breast will be put on the mammogram machine, and it will press firmly on your breast with a piece of plastic called a compression paddle. This will make your breast flatter so that the machine can X-ray all parts of your breast.   Both breasts will be X-rayed. Each breast will be X-rayed from above and from the side. An X-ray might need to be taken again if the picture is not good enough.   The mammogram will last about 15 to 30 minutes.  AFTER THE TEST Finding out the results of your test Ask when your test results will be ready. Make sure you get your test results. Document Released: 03/12/2009 Document Revised: 12/03/2011 Document Reviewed: 03/12/2009 [employer] Patient Information 2012 [employer]. 

## 2013-06-22 NOTE — Assessment & Plan Note (Signed)
I reviewed her last Lipid profile,Triglyceride elevated. Continue Pravachol I recommended starting fish oil as well. Weight loss and diet management will help as well,patient will work on this.

## 2013-06-22 NOTE — Assessment & Plan Note (Signed)
Patient doing well on Effexor,I do not think we need to go up on  Her dose at this time. I recommended psychologic counseling for her social anxiety but she declined. Continue Effexor.

## 2013-06-22 NOTE — Assessment & Plan Note (Signed)
Counseling done today. She will get Nicotine patch OTC. I will continue to discuss smoking cessation at subsequent visit.

## 2013-06-22 NOTE — Assessment & Plan Note (Signed)
Mammogram ordered,instruction given on how to schedule appointment.

## 2013-06-22 NOTE — Progress Notes (Signed)
Subjective:     Patient ID: Shelly Frazier, female   DOB: 11-13-1956, 57 y.o.   MRN: [id number]  HPI Anxiety:Patient feels much better,she only feels anxious when out among people or when she is in the store,whenever she is at home she feels calm and relaxed,denies problem with sleep,no feeling of hurting self,she is fine otherwise. GERD:Patient doing well on Prilosec but was told by the pharmacy that her insurance is not covering med and will like to change to Nexium,denies any GI symptoms today. HTN/HLD:Compliant with medications,here for follow up. Smoking:Patient has been smoking for many years,trying to cut back on her smoking,she is now down to 5 sticks per day,she will like to try Nicotine patch. HM: Has not had mammogram done,she had colonoscopy 1 yr ago which was normal.  Current Outpatient Prescriptions on File Prior to Visit  Medication Sig Dispense Refill  . aspirin 81 MG tablet Take 81 mg by mouth daily.      . carvedilol (COREG) 6.25 MG tablet Take 1 tablet (6.25 mg total) by mouth 2 (two) times daily with a meal.  60 tablet  11  . diphenhydrAMINE (BENADRYL) 25 MG tablet Take 25 mg by mouth every 6 (six) hours as needed.      . docusate sodium (COLACE) 100 MG capsule Take 1 capsule (100 mg total) by mouth 2 (two) times daily.  60 capsule  1  . hydrochlorothiazide (HYDRODIURIL) 25 MG tablet Take 1 tablet (25 mg total) by mouth daily.  90 tablet  3  . ibuprofen (ADVIL,MOTRIN) 200 MG tablet Take 200 mg by mouth every 6 (six) hours as needed.      . nitroGLYCERIN (NITROSTAT) 0.4 MG SL tablet Place 1 tablet (0.4 mg total) under the tongue every 5 (five) minutes as needed for chest pain.  12 tablet  1  . omeprazole (PRILOSEC) 20 MG capsule Take 1 capsule (20 mg total) by mouth daily.  30 capsule  11  . pravastatin (PRAVACHOL) 40 MG tablet Take 1 tablet (40 mg total) by mouth daily.  30 tablet  11  . tiotropium (SPIRIVA) 18 MCG inhalation capsule Place 1 capsule (18 mcg total) into inhaler  and inhale daily.  90 capsule  1  . venlafaxine XR (EFFEXOR XR) 37.5 MG 24 hr capsule Take 1 capsule (37.5 mg total) by mouth daily. For 1 week, then increase to two tablets daily  60 capsule  1   No current facility-administered medications on file prior to visit.   Past Medical History  Diagnosis Date  . Hypertension   . Bronchitis   . Anxiety   . Elevated fasting glucose   . GERD (gastroesophageal reflux disease)   . Chest pain   . Obesity   . Tobacco abuse   . Hyperlipemia      Review of Systems  Respiratory: Negative.   Cardiovascular: Negative.   Genitourinary: Negative.   Psychiatric/Behavioral: Negative for suicidal ideas, hallucinations, behavioral problems, sleep disturbance, self-injury, decreased concentration and agitation. The patient is nervous/anxious.   All other systems reviewed and are negative.       Objective:   Physical Exam  Nursing note and vitals reviewed. Constitutional: She is oriented to person, place, and time. She appears well-developed. No distress.  Cardiovascular: Normal rate, regular rhythm and normal heart sounds.   No murmur heard. Pulmonary/Chest: Effort normal and breath sounds normal. No respiratory distress. She has no wheezes.  Abdominal: Soft. Bowel sounds are normal. She exhibits no distension and no mass. There  is no tenderness.  Neurological: She is alert and oriented to person, place, and time.  Psychiatric: She has a normal mood and affect. Her behavior is normal. Judgment and thought content normal.       Assessment/Plan:  Anxiety disorder GERD HTN HLD Smoking Breast cancer screening

## 2013-06-22 NOTE — Assessment & Plan Note (Signed)
BP is well controlled on medication,continue current regimen.

## 2013-06-27 ENCOUNTER — Other Ambulatory Visit: Payer: Self-pay | Admitting: *Deleted

## 2013-06-27 MED ORDER — VENLAFAXINE HCL ER 75 MG PO CP24: 75.0000 mg | ORAL_CAPSULE | Freq: Every day | ORAL | Status: DC

## 2013-06-28 ENCOUNTER — Other Ambulatory Visit: Payer: Self-pay | Admitting: Family Medicine

## 2013-06-28 MED ORDER — ROSUVASTATIN CALCIUM 5 MG PO TABS: 5.0000 mg | ORAL_TABLET | Freq: Every day | ORAL | Status: DC

## 2013-06-28 MED ORDER — ESOMEPRAZOLE MAGNESIUM 20 MG PO CPDR: 20.0000 mg | DELAYED_RELEASE_CAPSULE | Freq: Every day | ORAL | Status: DC

## 2013-08-08 ENCOUNTER — Ambulatory Visit
Admission: RE | Admit: 2013-08-08 | Discharge: 2013-08-08 | Disposition: A | Discharge status: Home / Self Care | Payer: No Typology Code available for payment source | Source: Ambulatory Visit | Attending: Family Medicine | Admitting: Family Medicine

## 2013-08-08 DIAGNOSIS — Z1239 Encounter for other screening for malignant neoplasm of breast: Secondary | ICD-10-CM

## 2013-09-06 ENCOUNTER — Other Ambulatory Visit: Payer: Self-pay | Admitting: Family Medicine

## 2013-09-06 MED ORDER — VENLAFAXINE HCL ER 75 MG PO CP24: 75.0000 mg | ORAL_CAPSULE | Freq: Every day | ORAL | Status: DC

## 2013-09-06 NOTE — Telephone Encounter (Signed)
Refill request for Effexor completed.

## 2013-09-13 ENCOUNTER — Telehealth: Payer: Self-pay | Admitting: Family Medicine

## 2013-09-13 NOTE — Telephone Encounter (Signed)
Called and left a message with pharmacy refilling medication again.  Jazmin Hartsell,CMA

## 2013-09-13 NOTE — Telephone Encounter (Signed)
Pt called because [facility] map program said they didn't receive our fax for approval. She has been out of medication for two days now. She would like [place] to call again. It shows that it was called in on 9/10. JW

## 2013-09-26 ENCOUNTER — Other Ambulatory Visit: Payer: Self-pay | Admitting: Family Medicine

## 2013-09-26 MED ORDER — CARVEDILOL 6.25 MG PO TABS: 6.2500 mg | ORAL_TABLET | Freq: Two times a day (BID) | ORAL | Status: DC

## 2013-10-10 ENCOUNTER — Ambulatory Visit: Payer: No Typology Code available for payment source | Admitting: Family Medicine

## 2013-10-26 ENCOUNTER — Encounter: Payer: Self-pay | Admitting: Family Medicine

## 2013-10-26 ENCOUNTER — Ambulatory Visit (INDEPENDENT_AMBULATORY_CARE_PROVIDER_SITE_OTHER): Payer: No Typology Code available for payment source | Admitting: Family Medicine

## 2013-10-26 VITALS — BP 141/83 | HR 75 | Temp 98.6°F | Ht 65.0 in | Wt 208.0 lb

## 2013-10-26 DIAGNOSIS — F341 Dysthymic disorder: Secondary | ICD-10-CM

## 2013-10-26 DIAGNOSIS — F418 Other specified anxiety disorders: Secondary | ICD-10-CM

## 2013-10-26 DIAGNOSIS — Z639 Problem related to primary support group, unspecified: Secondary | ICD-10-CM

## 2013-10-26 DIAGNOSIS — J449 Chronic obstructive pulmonary disease, unspecified: Secondary | ICD-10-CM

## 2013-10-26 DIAGNOSIS — Z23 Encounter for immunization: Secondary | ICD-10-CM

## 2013-10-26 DIAGNOSIS — I10 Essential (primary) hypertension: Secondary | ICD-10-CM

## 2013-10-26 DIAGNOSIS — Z0000 Encounter for general adult medical examination without abnormal findings: Secondary | ICD-10-CM

## 2013-10-26 DIAGNOSIS — F439 Reaction to severe stress, unspecified: Secondary | ICD-10-CM

## 2013-10-26 DIAGNOSIS — IMO0001 Reserved for inherently not codable concepts without codable children: Secondary | ICD-10-CM

## 2013-10-26 DIAGNOSIS — M25511 Pain in right shoulder: Secondary | ICD-10-CM

## 2013-10-26 DIAGNOSIS — M25519 Pain in unspecified shoulder: Secondary | ICD-10-CM

## 2013-10-26 NOTE — Progress Notes (Signed)
Subjective:     Patient ID: Shelly Frazier, female   DOB: Nov 15, 1956, 57 y.o.   MRN: [id number]  HPI Depression:Patient denies any feeling of depression even though she is stressed an worried she might loss her home.She became teary when talking about this.She has been compliant with her Effexor and thought this is really helping her mood,she would like to continue current dose of Effexor. Stress:Patient has been under a lot of stress lately,she is worried she might loss her home as stated,she pays $55 per month with the help of her brother in law who had informed her he can no longer help her with the rents. She does not have a job and does not feel she can function at a job due to her anxiety and depression,she want to try an obtain disability. She is still requesting letter for her to keep pet in her apartment for stress relieve. Shoulder pain: C/O right shoulder pain worsening for the last 1.5 wk. She had hx of broken right clavicle in 2007,since then pain on and off around her right shoulder. Pain is worse with movement,about 8-10/10 in severity improve with rest. Ibuprofen help.Pain is better today,she denies any recent trauma. Abd Pain:C/O lower abdominal pain occasionally,currently not having pain, it feels like a drawing pain and cramping in nature lasting few min and then it would resolve by itself,her last was 4 days ago. She denies any diarrhea,feels constipated a times but typically moves her bowel 3 times a week. Denies any urinary symptoms.Denies blood in her stool. COPD: Patient is here for follow up,she continues to feel SOB occasionally,she has not had her Spiriva for over 1 wk now since her pharmacy has not supplied it yet,but she was informed she should get it in 2-3 days.She denies wheezing or chest tightness. XLK:[id number] with Coreg 6.25mg  BID,will like this to be changed to Coreg CR since she can get it for less at the [facility]. HM: Need flu shot and  pneumovax.  Current Outpatient Prescriptions on File Prior to Visit  Medication Sig Dispense Refill  . aspirin 81 MG tablet Take 81 mg by mouth daily.      . diphenhydrAMINE (BENADRYL) 25 MG tablet Take 25 mg by mouth every 6 (six) hours as needed.      . docusate sodium (COLACE) 100 MG capsule Take 1 capsule (100 mg total) by mouth 2 (two) times daily.  60 capsule  1  . esomeprazole (NEXIUM) 20 MG capsule Take 1 capsule (20 mg total) by mouth daily before breakfast.  90 capsule  1  . hydrochlorothiazide (HYDRODIURIL) 25 MG tablet Take 1 tablet (25 mg total) by mouth daily.  90 tablet  3  . ibuprofen (ADVIL,MOTRIN) 200 MG tablet Take 200 mg by mouth every 6 (six) hours as needed.      . nitroGLYCERIN (NITROSTAT) 0.4 MG SL tablet Place 1 tablet (0.4 mg total) under the tongue every 5 (five) minutes as needed for chest pain.  12 tablet  1  . pravastatin (PRAVACHOL) 40 MG tablet Take 1 tablet (40 mg total) by mouth daily.  30 tablet  11  . rosuvastatin (CRESTOR) 5 MG tablet Take 1 tablet (5 mg total) by mouth daily.  90 tablet  1  . tiotropium (SPIRIVA) 18 MCG inhalation capsule Place 1 capsule (18 mcg total) into inhaler and inhale daily.  90 capsule  1  . venlafaxine XR (EFFEXOR-XR) 75 MG 24 hr capsule Take 1 capsule (75 mg total) by mouth daily.  90 capsule  2   No current facility-administered medications on file prior to visit.   Past Medical History  Diagnosis Date  . Hypertension   . Bronchitis   . Anxiety   . Elevated fasting glucose   . GERD (gastroesophageal reflux disease)   . Chest pain   . Obesity   . Tobacco abuse   . Hyperlipemia       Review of Systems  Respiratory: Positive for shortness of breath.   Cardiovascular: Negative.   Gastrointestinal: Positive for abdominal pain.  Musculoskeletal: Positive for arthralgias.  Neurological: Negative for dizziness and headaches.  Psychiatric/Behavioral: Negative for suicidal ideas, hallucinations, sleep disturbance and  self-injury.       Stress.  All other systems reviewed and are negative.    Filed Vitals:   10/26/13 1418  BP: 141/83  Pulse: 75  Temp: 98.6 F (37 C)  TempSrc: Oral  Height: 5\' 5"  (1.651 m)  Weight: 208 lb (94.348 kg)  SpO2: 98%       Objective:   Physical Exam  Nursing note and vitals reviewed. Constitutional: She is oriented to person, place, and time. She appears well-developed. No distress.  Cardiovascular: Normal rate, regular rhythm and normal heart sounds.   No murmur heard. Pulmonary/Chest: Effort normal and breath sounds normal. No respiratory distress. She has no decreased breath sounds. She has no wheezes. She has no rhonchi. She has no rales.  Abdominal: Soft. Normal appearance and bowel sounds are normal. She exhibits no distension and no mass. There is no hepatosplenomegaly. There is no tenderness. There is no rebound.  Musculoskeletal: She exhibits no edema.       Right shoulder: She exhibits decreased range of motion and tenderness. She exhibits no bony tenderness, no swelling, no effusion and no crepitus.       Left shoulder: Normal.  Neurological: She is alert and oriented to person, place, and time.  Psychiatric: Her speech is normal. Judgment and thought content normal. Her mood appears not anxious. She is not agitated, not aggressive, not hyperactive, not slowed, not withdrawn and not actively hallucinating. Cognition and memory are normal.  Teary most of the time worried about becoming homeless.       Assessment:     Depression Stress Right shoulder Lower abdominal pain COPD HTN Health maintenance: Influenza and Pneumovax vaccination.     Plan:     Check problem list.

## 2013-10-26 NOTE — Patient Instructions (Signed)
Shoulder Pain  The shoulder is the joint that connects your arm to your body. Muscles and band-like tissues that connect bones to muscles (tendons) hold the joint together. Shoulder pain is felt if an injury or medical problem affects one or more parts of the shoulder.  HOME CARE    Put ice on the sore area.   Put ice in a plastic bag.   Place a towel between your skin and the bag.   Leave the ice on for 15-20 minutes, 3-4 times a day for the first 2 days.   Stop using cold packs if they do not help with the pain.   If you were given something to keep your shoulder from moving (sling, shoulder immobilizer), wear it as told. Only take it off to shower or bathe.   Move your arm as little as possible, but keep your hand moving to prevent puffiness (swelling).   Squeeze a soft ball or foam pad as much as possible to help prevent swelling.   Take medicine as told by your doctor.  GET HELP RIGHT AWAY IF:    Your arm, hand, or fingers are numb or tingling.   Your arm, hand, or fingers are puffy (swollen), painful, or turn white or blue.   You have more pain.   You have progressing new pain in your arm, hand, or fingers.   Your hand or fingers get cold.   Your medicine does not help lessen your pain.  MAKE SURE YOU:    Understand these instructions.   Will watch your condition.   Will get help right away if you are not doing well or get worse.  Document Released: 06/01/2008 Document Revised: 09/07/2012 Document Reviewed: 06/27/2012  [employer] Patient Information 2014 [employer].

## 2013-10-27 DIAGNOSIS — F439 Reaction to severe stress, unspecified: Secondary | ICD-10-CM | POA: Insufficient documentation

## 2013-10-27 DIAGNOSIS — Z0000 Encounter for general adult medical examination without abnormal findings: Secondary | ICD-10-CM | POA: Insufficient documentation

## 2013-10-27 DIAGNOSIS — F418 Other specified anxiety disorders: Secondary | ICD-10-CM | POA: Insufficient documentation

## 2013-10-27 DIAGNOSIS — M25511 Pain in right shoulder: Secondary | ICD-10-CM | POA: Insufficient documentation

## 2013-10-27 MED ORDER — ALBUTEROL SULFATE HFA 108 (90 BASE) MCG/ACT IN AERS: 2.0000 | INHALATION_SPRAY | Freq: Four times a day (QID) | RESPIRATORY_TRACT | Status: DC | PRN

## 2013-10-27 MED ORDER — OLMESARTAN MEDOXOMIL 20 MG PO TABS: 20.0000 mg | ORAL_TABLET | Freq: Every day | ORAL | Status: DC

## 2013-10-27 MED ORDER — ALBUTEROL SULFATE (2.5 MG/3ML) 0.083% IN NEBU: 2.5000 mg | INHALATION_SOLUTION | Freq: Four times a day (QID) | RESPIRATORY_TRACT | Status: DC | PRN

## 2013-10-27 NOTE — Assessment & Plan Note (Signed)
Doing well on HCTZ25 mg qd and Coreg 6.25mg  BID. I recommended d/c Coreg altogether since she has COPD. Patient agreed with plan. I started her on Benicar 20 mg qd in addition to the HCTZ. Continue home BP monitoring.

## 2013-10-27 NOTE — Assessment & Plan Note (Signed)
Flu shot and pneumovax given.

## 2013-10-27 NOTE — Assessment & Plan Note (Signed)
May be related to prior traumatic event from 2007 vs arthritis. This has been going on for 1.5 wk improved with tylenol or ibuprofen. Continue Ibuprofen prn and home shoulder exercise. F/U in 2-4 wks if no improvement, at that time I will obtain xray and refer to PT. Patient agreed with conservative management for now.

## 2013-10-27 NOTE — Assessment & Plan Note (Addendum)
Stable with no acute symptom. O2 sat on RA is 98% today. Resp exam benign. Plan to restart Spiriva as soon as she gets it. I refilled Albuterol prn SOB. Consider d/c Coreg in the setting of COPD. She continues to smoke, cessation counseling done as well.

## 2013-10-27 NOTE — Assessment & Plan Note (Signed)
Stress reduction strategy discussed. I agree with her applying for disability to help with her home situation. Patient would call if needing anything to support her application.

## 2013-10-27 NOTE — Assessment & Plan Note (Signed)
Has been doing well on Effexor. Not suicidal or homicidal. Has some stressor event but this had not made her depression worse,but she has become more anxious. Relaxation technique counseling done. Continue current dose of Effexor for now.

## 2013-11-07 ENCOUNTER — Telehealth: Payer: Self-pay | Admitting: Family Medicine

## 2013-11-07 NOTE — Telephone Encounter (Signed)
Pt called for two reasons. One she would like a prescription of ibuprofen called to the map program because she takes but can not afford to pay out pocket and she could get it for free through [facility]. Second is Dr. Lum Babe  Was going to write a letter to her apartment house saying she needed a pet. jw

## 2013-11-07 NOTE — Telephone Encounter (Signed)
I will complete letter for her to pick up tomorrow, I thought I had completed this before though, I will check on it.

## 2013-11-08 ENCOUNTER — Encounter: Payer: Self-pay | Admitting: Family Medicine

## 2013-11-08 ENCOUNTER — Telehealth: Payer: Self-pay | Admitting: Family Medicine

## 2013-11-08 MED ORDER — IBUPROFEN 800 MG PO TABS: 400.0000 mg | ORAL_TABLET | Freq: Four times a day (QID) | ORAL | Status: DC | PRN

## 2013-11-08 NOTE — Telephone Encounter (Signed)
Patient calling for Ibuprofen refill, I called in her medication to the pharmacy.

## 2013-11-08 NOTE — Telephone Encounter (Signed)
Pt informed.  Pt is asking about the ibuprofen.  Will forward back to MD. Milas Gain, Maryjo Rochester

## 2013-11-08 NOTE — Telephone Encounter (Signed)
See previous phone note. Fleeger, Shelly Frazier

## 2013-11-08 NOTE — Telephone Encounter (Signed)
Pt is aware.  Shelly Frazier,CMA  

## 2013-11-08 NOTE — Telephone Encounter (Signed)
[facility].

## 2013-11-08 NOTE — Telephone Encounter (Signed)
Please inform patient her letter is ready for pick up. 

## 2013-11-09 ENCOUNTER — Encounter (HOSPITAL_COMMUNITY): Payer: Self-pay | Admitting: Emergency Medicine

## 2013-11-09 ENCOUNTER — Emergency Department (HOSPITAL_COMMUNITY): Payer: No Typology Code available for payment source

## 2013-11-09 ENCOUNTER — Emergency Department (HOSPITAL_COMMUNITY)
Admission: EM | Admit: 2013-11-09 | Discharge: 2013-11-09 | Disposition: A | Discharge status: Home / Self Care | Payer: No Typology Code available for payment source | Attending: Emergency Medicine | Admitting: Emergency Medicine

## 2013-11-09 DIAGNOSIS — K219 Gastro-esophageal reflux disease without esophagitis: Secondary | ICD-10-CM | POA: Insufficient documentation

## 2013-11-09 DIAGNOSIS — F17200 Nicotine dependence, unspecified, uncomplicated: Secondary | ICD-10-CM | POA: Insufficient documentation

## 2013-11-09 DIAGNOSIS — E785 Hyperlipidemia, unspecified: Secondary | ICD-10-CM | POA: Insufficient documentation

## 2013-11-09 DIAGNOSIS — Z79899 Other long term (current) drug therapy: Secondary | ICD-10-CM | POA: Insufficient documentation

## 2013-11-09 DIAGNOSIS — I10 Essential (primary) hypertension: Secondary | ICD-10-CM | POA: Insufficient documentation

## 2013-11-09 DIAGNOSIS — F411 Generalized anxiety disorder: Secondary | ICD-10-CM | POA: Insufficient documentation

## 2013-11-09 DIAGNOSIS — R079 Chest pain, unspecified: Secondary | ICD-10-CM | POA: Insufficient documentation

## 2013-11-09 DIAGNOSIS — Z7982 Long term (current) use of aspirin: Secondary | ICD-10-CM | POA: Insufficient documentation

## 2013-11-09 DIAGNOSIS — M5412 Radiculopathy, cervical region: Secondary | ICD-10-CM

## 2013-11-09 DIAGNOSIS — E669 Obesity, unspecified: Secondary | ICD-10-CM | POA: Insufficient documentation

## 2013-11-09 LAB — CBC WITH DIFFERENTIAL/PLATELET
Basophils Relative: 0 % (ref 0–1)
Eosinophils Absolute: 0.1 10*3/uL (ref 0.0–0.7)
Eosinophils Relative: 1 % (ref 0–5)
HCT: 42.3 % (ref 36.0–46.0)
Hemoglobin: 14.7 g/dL (ref 12.0–15.0)
MCH: 30.1 pg (ref 26.0–34.0)
MCHC: 34.8 g/dL (ref 30.0–36.0)
MCV: 86.5 fL (ref 78.0–100.0)
Monocytes Absolute: 1.5 10*3/uL — ABNORMAL HIGH (ref 0.1–1.0)
Monocytes Relative: 16 % — ABNORMAL HIGH (ref 3–12)

## 2013-11-09 LAB — BASIC METABOLIC PANEL
BUN: 13 mg/dL (ref 6–23)
Calcium: 10.3 mg/dL (ref 8.4–10.5)
Creatinine, Ser: 0.74 mg/dL (ref 0.50–1.10)
GFR calc non Af Amer: >90 mL/min (ref >90)
Glucose, Bld: 128 mg/dL — ABNORMAL HIGH (ref 70–99)
Potassium: 3.4 mEq/L — ABNORMAL LOW (ref 3.5–5.1)

## 2013-11-09 MED ORDER — OXYCODONE-ACETAMINOPHEN 5-325 MG PO TABS: 1.0000 | ORAL_TABLET | Freq: Once | ORAL | Status: DC

## 2013-11-09 MED ORDER — OXYCODONE-ACETAMINOPHEN 5-325 MG PO TABS
2.0000 | ORAL_TABLET | Freq: Once | ORAL | Status: AC
  Administered 2013-11-09: 2 via ORAL
  Filled 2013-11-09: qty 2

## 2013-11-09 MED ORDER — CYCLOBENZAPRINE HCL 10 MG PO TABS: 10.0000 mg | ORAL_TABLET | Freq: Two times a day (BID) | ORAL | Status: DC | PRN

## 2013-11-09 NOTE — Discharge Instructions (Signed)
Cervical Radiculopathy Cervical radiculopathy happens when a nerve in the neck is pinched or bruised by a slipped (herniated) disk or by arthritic changes in the bones of the cervical spine. This can occur due to an injury or as part of the normal aging process. Pressure on the cervical nerves can cause pain or numbness that runs from your neck all the way down into your arm and fingers. CAUSES  There are many possible causes, including:  Injury.  Muscle tightness in the neck from overuse.  Swollen, painful joints (arthritis).  Breakdown or degeneration in the bones and joints of the spine (spondylosis) due to aging.  Bone spurs that may develop near the cervical nerves. SYMPTOMS  Symptoms include pain, weakness, or numbness in the affected arm and hand. Pain can be severe or irritating. Symptoms may be worse when extending or turning the neck. DIAGNOSIS  Your caregiver will ask about your symptoms and do a physical exam. He or she may test your strength and reflexes. X-rays, CT scans, and MRI scans may be needed in cases of injury or if the symptoms do not go away after a period of time. Electromyography (EMG) or nerve conduction testing may be done to study how your nerves and muscles are working. TREATMENT  Your caregiver may recommend certain exercises to help relieve your symptoms. Cervical radiculopathy can, and often does, get better with time and treatment. If your problems continue, treatment options may include:  Wearing a soft collar for short periods of time.  Physical therapy to strengthen the neck muscles.  Medicines, such as nonsteroidal anti-inflammatory drugs (NSAIDs), oral corticosteroids, or spinal injections.  Surgery. Different types of surgery may be done depending on the cause of your problems. HOME CARE INSTRUCTIONS   Put ice on the affected area.  Put ice in a plastic bag.  Place a towel between your skin and the bag.  Leave the ice on for 15-20 minutes,  03-04 times a day or as directed by your caregiver.  If ice does not help, you can try using heat. Take a warm shower or bath, or use a hot water bottle as directed by your caregiver.  You may try a gentle neck and shoulder massage.  Use a flat pillow when you sleep.  Only take over-the-counter or prescription medicines for pain, discomfort, or fever as directed by your caregiver.  If physical therapy was prescribed, follow your caregiver's directions.  If a soft collar was prescribed, use it as directed. SEEK IMMEDIATE MEDICAL CARE IF:   Your pain gets much worse and cannot be controlled with medicines.  You have weakness or numbness in your hand, arm, face, or leg.  You have a high fever or a stiff, rigid neck.  You lose bowel or bladder control (incontinence).  You have trouble with walking, balance, or speaking. MAKE SURE YOU:   Understand these instructions.  Will watch your condition.  Will get help right away if you are not doing well or get worse. Document Released: 09/08/2001 Document Revised: 03/07/2012 Document Reviewed: 07/28/2011 [facility] Patient Information 2014 [place], [place].  Radicular Pain Radicular pain in either the arm or leg is usually from a bulging or herniated disk in the spine. A piece of the herniated disk may press against the nerves as the nerves exit the spine. This causes pain which is felt at the tips of the nerves down the arm or leg. Other causes of radicular pain may include:  Fractures.  Heart disease.  Cancer.  An abnormal and usually degenerative state of the nervous system or nerves (neuropathy). Diagnosis may require CT or MRI scanning to determine the primary cause.  Nerves that start at the neck (nerve roots) may cause radicular pain in the outer shoulder and arm. It can spread down to the thumb and fingers. The symptoms vary depending on which nerve root has been affected. In most cases radicular pain improves with conservative  treatment. Neck problems may require physical therapy, a neck collar, or cervical traction. Treatment may take many weeks, and surgery may be considered if the symptoms do not improve.  Conservative treatment is also recommended for sciatica. Sciatica causes pain to radiate from the lower back or buttock area down the leg into the foot. Often there is a history of back problems. Most patients with sciatica are better after 2 to 4 weeks of rest and other supportive care. Short term bed rest can reduce the disk pressure considerably. Sitting, however, is not a good position since this increases the pressure on the disk. You should avoid bending, lifting, and all other activities which make the problem worse. Traction can be used in severe cases. Surgery is usually reserved for patients who do not improve within the first months of treatment. Only take over-the-counter or prescription medicines for pain, discomfort, or fever as directed by your caregiver. Narcotics and muscle relaxants may help by relieving more severe pain and spasm and by providing mild sedation. Cold or massage can give significant relief. Spinal manipulation is not recommended. It can increase the degree of disc protrusion. Epidural steroid injections are often effective treatment for radicular pain. These injections deliver medicine to the spinal nerve in the space between the protective covering of the spinal cord and back bones (vertebrae). Your caregiver can give you more information about steroid injections. These injections are most effective when given within two weeks of the onset of pain.  You should see your caregiver for follow up care as recommended. A program for neck and back injury rehabilitation with stretching and strengthening exercises is an important part of management.  SEEK IMMEDIATE MEDICAL CARE IF:  You develop increased pain, weakness, or numbness in your arm or leg.  You develop difficulty with bladder or bowel  control.  You develop abdominal pain. Document Released: 01/21/2005 Document Revised: 03/07/2012 Document Reviewed: 04/08/2009 [facility] Patient Information 2014 [place], [place].

## 2013-11-09 NOTE — ED Provider Notes (Signed)
CSN: [id number]     Arrival date & time 11/09/13  1215 History   First MD Initiated Contact with Patient 11/09/13 1231     Chief Complaint  Patient presents with  . Chest Pain  . left shoulder pain    (Consider location/radiation/quality/duration/timing/severity/associated sxs/prior Treatment) Patient is a 57 y.o. female presenting with shoulder pain. The history is provided by the patient. No language interpreter was used.  Shoulder Pain This is a new problem. The current episode started yesterday. Associated symptoms include chest pain and neck pain. Pertinent negatives include no abdominal pain, fever, nausea, numbness or weakness. Associated symptoms comments: She complains of sharp pain, worse with movement, in left neck, left shoulder, lateral left upper extremity extending into left upper chest. No SOB, cough or fever. She has a history of vertebral disc disease with surgical treatment of lumbar spine remotely. No weakness, numbness or tingling of extremity..    Past Medical History  Diagnosis Date  . Hypertension   . Bronchitis   . Anxiety   . Elevated fasting glucose   . GERD (gastroesophageal reflux disease)   . Chest pain   . Obesity   . Tobacco abuse   . Hyperlipemia    Past Surgical History  Procedure Laterality Date  . Back surgery  1978  . Tubal ligation    . Lumpectomy left breast      benign  . Reconstructive surgeries on forehead    . Abdominal hysterectomy      endometrial, heavy bleeding  . Sinus surgery  1987   Family History  Problem Relation Age of Onset  . Cancer Mother 29    peritoneal cancer  . Heart disease Mother     bypass  . Hypertension Mother   . Cancer Father 69    lung ca  . Depression Sister   . Stroke Maternal Grandmother   . Heart disease Maternal Grandmother   . Heart disease Maternal Grandfather   . Heart disease Paternal Grandmother   . Heart disease Paternal Grandfather   . Diabetes Neg Hx   . Alcohol abuse Sister   .  Liver disease Sister   . Depression Sister    History  Substance Use Topics  . Smoking status: Current Every Day Smoker -- 0.50 packs/day for 43 years    Types: Cigarettes    Last Attempt to Quit: 11/03/2012  . Smokeless tobacco: Never Used  . Alcohol Use: No   OB History   Grav Para Term Preterm Abortions TAB SAB Ect Mult Living                 Review of Systems  Constitutional: Negative for fever.  Respiratory: Negative for shortness of breath.   Cardiovascular: Positive for chest pain.  Gastrointestinal: Negative for nausea and abdominal pain.  Musculoskeletal: Positive for neck pain.       See HPI.  Neurological: Negative for weakness and numbness.    Allergies  Naproxen; Aspirin; and Codeine  Home Medications   Current Outpatient Rx  Name  Route  Sig  Dispense  Refill  . albuterol (PROVENTIL HFA;VENTOLIN HFA) 108 (90 BASE) MCG/ACT inhaler   Inhalation   Inhale 2 puffs into the lungs every 6 (six) hours as needed for wheezing.   1 Inhaler   3   . aspirin 81 MG tablet   Oral   Take 81 mg by mouth daily.         . carvedilol (COREG) 6.25 MG tablet  Oral   Take 6.25 mg by mouth 2 (two) times daily with a meal.         . esomeprazole (NEXIUM) 20 MG capsule   Oral   Take 1 capsule (20 mg total) by mouth daily before breakfast.   90 capsule   1   . hydrochlorothiazide (HYDRODIURIL) 25 MG tablet   Oral   Take 1 tablet (25 mg total) by mouth daily.   90 tablet   3   . rosuvastatin (CRESTOR) 5 MG tablet   Oral   Take 1 tablet (5 mg total) by mouth daily.   90 tablet   1   . tiotropium (SPIRIVA) 18 MCG inhalation capsule   Inhalation   Place 1 capsule (18 mcg total) into inhaler and inhale daily.   90 capsule   1   . venlafaxine XR (EFFEXOR-XR) 75 MG 24 hr capsule   Oral   Take 1 capsule (75 mg total) by mouth daily.   90 capsule   2   . nitroGLYCERIN (NITROSTAT) 0.4 MG SL tablet   Sublingual   Place 1 tablet (0.4 mg total) under the  tongue every 5 (five) minutes as needed for chest pain.   12 tablet   1    BP 143/97  Pulse 83  Temp(Src) 97.8 F (36.6 C) (Oral)  Resp 24  SpO2 100% Physical Exam  Constitutional: She is oriented to person, place, and time. She appears well-developed and well-nourished.  HENT:  Head: Normocephalic.  Neck: Normal range of motion. Neck supple.  Cardiovascular: Normal rate, regular rhythm and intact distal pulses.   No murmur heard. Pulmonary/Chest: Effort normal and breath sounds normal.  Abdominal: Soft. Bowel sounds are normal. There is no tenderness. There is no rebound and no guarding.  Musculoskeletal: Normal range of motion.  No midline cervical tenderness, however, there is left paracervical tenderness to palpation. No swelling. FROM left UE, full strength without defict. Tender Left upper chest, left shoulder and left upper arm. Pain with active and passive ROM. No discoloration, redness or swelling.  Neurological: She is alert and oriented to person, place, and time.  Skin: Skin is warm and dry. No rash noted.  Psychiatric: She has a normal mood and affect.    ED Course  Procedures (including critical care time) Labs Review Labs Reviewed  CBC WITH DIFFERENTIAL - Abnormal; Notable for the following:    Monocytes Relative 16 (*)    Monocytes Absolute 1.5 (*)    All other components within normal limits  TROPONIN I  BASIC METABOLIC PANEL   Results for orders placed during the hospital encounter of 11/09/13  CBC WITH DIFFERENTIAL      Result Value Range   WBC 9.4  4.0 - 10.5 K/uL   RBC 4.89  3.87 - 5.11 MIL/uL   Hemoglobin 14.7  12.0 - 15.0 g/dL   HCT 45.4  09.8 - 11.9 %   MCV 86.5  78.0 - 100.0 fL   MCH 30.1  26.0 - 34.0 pg   MCHC 34.8  30.0 - 36.0 g/dL   RDW 14.7  82.9 - 56.2 %   Platelets 208  150 - 400 K/uL   Neutrophils Relative % 53  43 - 77 %   Neutro Abs 4.9  1.7 - 7.7 K/uL   Lymphocytes Relative 31  12 - 46 %   Lymphs Abs 2.9  0.7 - 4.0 K/uL    Monocytes Relative 16 (*) 3 - 12 %   Monocytes  Absolute 1.5 (*) 0.1 - 1.0 K/uL   Eosinophils Relative 1  0 - 5 %   Eosinophils Absolute 0.1  0.0 - 0.7 K/uL   Basophils Relative 0  0 - 1 %   Basophils Absolute 0.0  0.0 - 0.1 K/uL  TROPONIN I      Result Value Range   Troponin I <0.30  <0.30 ng/mL  BASIC METABOLIC PANEL      Result Value Range   Sodium 134 (*) 135 - 145 mEq/L   Potassium 3.4 (*) 3.5 - 5.1 mEq/L   Chloride 96  96 - 112 mEq/L   CO2 25  19 - 32 mEq/L   Glucose, Bld 128 (*) 70 - 99 mg/dL   BUN 13  6 - 23 mg/dL   Creatinine, Ser 1.61  0.50 - 1.10 mg/dL   Calcium 09.6  8.4 - 04.5 mg/dL   GFR calc non Af Amer >90  >90 mL/min   GFR calc Af Amer >90  >90 mL/min    Imaging Review Dg Chest 2 View  11/09/2013   CLINICAL DATA:  Chest pain.  EXAM: CHEST  2 VIEW  COMPARISON:  PA and lateral chest 11/18/2012.  FINDINGS: Lungs are clear. Heart size is normal. No pneumothorax or pleural fluid. Mild coarsening of the pulmonary interstitium is noted. Remote right clavicle fracture seen.  IMPRESSION: No acute finding.  Stable compared to prior exam.   Electronically Signed   By: Drusilla Kanner M.D.   On: 11/09/2013 13:49    EKG Interpretation     Ventricular Rate:  85 PR Interval:  167 QRS Duration: 101 QT Interval:  388 QTC Calculation: 461 R Axis:   53 Text Interpretation:  Sinus rhythm Low voltage, precordial leads            MDM  No diagnosis found. 1. Cervical radiculopathy  No evidence of ACS with atypical symptoms. Pain follows pattern of cervical radiculopathy, C4/C5 distribution. Pain is improved. Stable for discharge.     Arnoldo Hooker, PA-C 11/09/13 [phone]

## 2013-11-09 NOTE — Progress Notes (Signed)
   CARE MANAGEMENT ED NOTE 11/09/2013  Patient:  Shelly Frazier, Shelly Frazier   Account Number:  [id number]  Date Initiated:  11/09/2013  Documentation initiated by:  Edd Arbour  Subjective/Objective Assessment:   57 yr old female self pay [employer] pt with "an orange card" per pt pcp dr Lum Babe c/o left shoulder and breast pain no admissions and 1 ED visit in last 6 months Cm consulted by ED registration member whom pt spoke with     Subjective/Objective Assessment Detail:   Pt d/c with flexeril and narcotic and referred to Dr Gerlene Fee for dx Cervical Radiculopathy per ED PA. Pt reports her other routine medications are obtained through [employer] [facility] program States a girlfriend told her to ask at [facility] if assistance can be given for pain medications Pt stated she was "ok" when cm discussed $4 cost at [employer] for each d/c rx provided     Action/Plan:   CM reviewed EPIC notes after consulted by ED registration for med assist for pt. Cm spoke with ED PA. Cm spoke with pt & discussed [facility] med assistance does not cover narcotics & flexeril cost $4 at discount pharmacy   Action/Plan Detail:   Provided pt with written information for needymmes.org, discount pharmacies and financial assistance resources in [facility] Provided pt with contact information for [facility] to inquire about [facility] assistance policy for pain medications   Anticipated DC Date:  11/09/2013     Status Recommendation to Physician:   Result of Recommendation:    Other ED Services  Consult Working Plan    DC Planning Services  Other  Outpatient Services - Pt will follow up  [facility] / [facility] (established/new)    Choice offered to / List presented to:            Status of service:  Completed, signed off  ED Comments:   ED Comments Detail:  provided pt with a 5 page sheet also containing information about  self pay pcps, importance of pcp for f/u care, other [employer] such as financial assistance,  DSS and  health department  Jovita Kussmaul, family medicine at [address], [facility] family practice, general medical clinics, [facility] urgent care plus others, [facility] out patient pharmacies and housing Pt voiced understanding and appreciation of resources provided

## 2013-11-09 NOTE — ED Notes (Signed)
Pt sts left chest pain that started last night, radiates to left shoulder and neck. Pt sts hx of hypertension and "extensive hx of heart disease in family"

## 2013-11-09 NOTE — ED Notes (Signed)
Notified nurse about bp

## 2013-11-09 NOTE — Progress Notes (Signed)
[facility] CL spoke with patient about [employer]. Patient confirmed her PCP was [facility], Dr. Lum Babe. Patient declined my offer at this time to set her up with a follow-up apt, stating that she already has one next Thursday.

## 2013-11-10 ENCOUNTER — Telehealth: Payer: Self-pay

## 2013-11-10 NOTE — Telephone Encounter (Signed)
No xray of the neck on Epic to confirm diagnosis of cervical radiculopathy showing cervical spinal stenosis, please ask if she had one done, I will like to see her for  Reassessment before referral.

## 2013-11-10 NOTE — Telephone Encounter (Signed)
Thanks

## 2013-11-10 NOTE — Telephone Encounter (Signed)
Patient seen in ED and dx with Cervical Radiculopathy. She will need referral to surgeon. Patient will keep appt with Dr. Lum Babe on 11/20

## 2013-11-10 NOTE — Telephone Encounter (Signed)
Pt states that when she went into the ER with extreme pain.  An ekg and chest xray to rule out any cardiac issues.  Pt states that after giving dr her sx's and physical assesment this dx was given.  Pt states that she will discuss more with you when she comes in.  [facility]

## 2013-11-13 NOTE — ED Provider Notes (Signed)
Medical screening examination/treatment/procedure(s) were performed by non-physician practitioner and as supervising physician I was immediately available for consultation/collaboration.  EKG Interpretation     Ventricular Rate:  85 PR Interval:  167 QRS Duration: 101 QT Interval:  388 QTC Calculation: 461 R Axis:   53 Text Interpretation:  Sinus rhythm Low voltage, precordial leads             Harrold Donath R. Rubin Payor, MD 11/13/13 [phone]

## 2013-11-16 ENCOUNTER — Ambulatory Visit (INDEPENDENT_AMBULATORY_CARE_PROVIDER_SITE_OTHER): Payer: No Typology Code available for payment source | Admitting: Family Medicine

## 2013-11-16 ENCOUNTER — Ambulatory Visit (HOSPITAL_COMMUNITY)
Admission: RE | Admit: 2013-11-16 | Discharge: 2013-11-16 | Disposition: A | Discharge status: Home / Self Care | Payer: No Typology Code available for payment source | Source: Ambulatory Visit | Attending: Family Medicine | Admitting: Family Medicine

## 2013-11-16 ENCOUNTER — Encounter: Payer: Self-pay | Admitting: Family Medicine

## 2013-11-16 VITALS — BP 122/80 | HR 87 | Temp 97.0°F | Resp 16 | Ht 65.0 in | Wt 210.6 lb

## 2013-11-16 DIAGNOSIS — M25519 Pain in unspecified shoulder: Secondary | ICD-10-CM | POA: Insufficient documentation

## 2013-11-16 DIAGNOSIS — M25511 Pain in right shoulder: Secondary | ICD-10-CM | POA: Insufficient documentation

## 2013-11-16 DIAGNOSIS — J069 Acute upper respiratory infection, unspecified: Secondary | ICD-10-CM | POA: Insufficient documentation

## 2013-11-16 NOTE — Patient Instructions (Signed)

## 2013-11-16 NOTE — Assessment & Plan Note (Signed)
Likely viral infection. Pulmonary exam benign. Patient reassured this should resolve in few days. OTC cough syrup as needed recommended.

## 2013-11-16 NOTE — Assessment & Plan Note (Signed)
Left shoulder pain resolved. More of right shoulder pain which has been on going for more than 4 wks. Xray ordered to assess shoulder. Tylenol prn pain. COntinue home shoulder exercise.

## 2013-11-16 NOTE — Progress Notes (Signed)
Subjective:     Patient ID: Shelly Frazier, female   DOB: 05-01-1956, 57 y.o.   MRN: [id number]  HPI Shoulder pain:Patient was recently seen at the ED for severe left shoulder all the way to the neck about 1 wk ago pain was about about 10/10  but pain is better now, here for follow up,she was informed she has cervical radiculopathy at the ED and needed to see neurosurgeon.She denies any left shoulder or neck pain today. She however continues to have right shoulder pain about 6/10 in severity. She will like to get this reassessed. Cough: Coughing X 2 days,no sputum production,no sick contact,no wheezing,no SOB.  Current Outpatient Prescriptions on File Prior to Visit  Medication Sig Dispense Refill  . albuterol (PROVENTIL HFA;VENTOLIN HFA) 108 (90 BASE) MCG/ACT inhaler Inhale 2 puffs into the lungs every 6 (six) hours as needed for wheezing.  1 Inhaler  3  . aspirin 81 MG tablet Take 81 mg by mouth daily.      . carvedilol (COREG) 6.25 MG tablet Take 6.25 mg by mouth 2 (two) times daily with a meal.      . cyclobenzaprine (FLEXERIL) 10 MG tablet Take 1 tablet (10 mg total) by mouth 2 (two) times daily as needed for muscle spasms.  20 tablet  0  . esomeprazole (NEXIUM) 20 MG capsule Take 1 capsule (20 mg total) by mouth daily before breakfast.  90 capsule  1  . hydrochlorothiazide (HYDRODIURIL) 25 MG tablet Take 1 tablet (25 mg total) by mouth daily.  90 tablet  3  . nitroGLYCERIN (NITROSTAT) 0.4 MG SL tablet Place 1 tablet (0.4 mg total) under the tongue every 5 (five) minutes as needed for chest pain.  12 tablet  1  . rosuvastatin (CRESTOR) 5 MG tablet Take 1 tablet (5 mg total) by mouth daily.  90 tablet  1  . tiotropium (SPIRIVA) 18 MCG inhalation capsule Place 1 capsule (18 mcg total) into inhaler and inhale daily.  90 capsule  1  . venlafaxine XR (EFFEXOR-XR) 75 MG 24 hr capsule Take 1 capsule (75 mg total) by mouth daily.  90 capsule  2   No current facility-administered medications on file  prior to visit.   Past Medical History  Diagnosis Date  . Hypertension   . Bronchitis   . Anxiety   . Elevated fasting glucose   . GERD (gastroesophageal reflux disease)   . Chest pain   . Obesity   . Tobacco abuse   . Hyperlipemia     Review of Systems  Respiratory: Positive for cough. Negative for shortness of breath and wheezing.   Cardiovascular: Negative.   Gastrointestinal: Negative.   All other systems reviewed and are negative.   Filed Vitals:   11/16/13 1334  BP: 122/80  Pulse: 87  Temp: 97 F (36.1 C)  TempSrc: Oral  Resp: 16  Height: 5\' 5"  (1.651 m)  Weight: 210 lb 9.6 oz (95.528 kg)  SpO2: 96%       Objective:   Physical Exam  Nursing note and vitals reviewed. Constitutional: She appears well-developed. No distress.  Neck: Normal range of motion. Neck supple. Normal range of motion present.  Cardiovascular: Normal rate, regular rhythm, normal heart sounds and intact distal pulses.   No murmur heard. Pulmonary/Chest: Effort normal and breath sounds normal. No respiratory distress. She has no wheezes.  Abdominal: Soft. Bowel sounds are normal. There is no tenderness.  Musculoskeletal: She exhibits no edema.  Right shoulder: She exhibits decreased range of motion and tenderness. She exhibits no swelling and no crepitus.       Left shoulder: Normal.       Cervical back: Normal.       Assessment:     Shoulder pain: B/L URI      Plan:     Check problem list

## 2013-11-22 ENCOUNTER — Ambulatory Visit: Payer: No Typology Code available for payment source

## 2013-11-28 ENCOUNTER — Telehealth: Payer: Self-pay | Admitting: Family Medicine

## 2013-11-28 NOTE — Telephone Encounter (Signed)
Patient request a letter from Dr. Lum Babe to her apartment complex that she is unable to work and is pending disability. Please call patient once completed and ready for pick up.

## 2013-11-29 ENCOUNTER — Encounter: Payer: Self-pay | Admitting: Family Medicine

## 2013-11-29 NOTE — Telephone Encounter (Signed)
Pt is aware.  Jazmin Hartsell,CMA  

## 2013-11-29 NOTE — Telephone Encounter (Signed)
Letter placed in front office for pick up,please left patient know.

## 2014-01-11 ENCOUNTER — Encounter: Payer: Self-pay | Admitting: Family Medicine

## 2014-01-11 ENCOUNTER — Ambulatory Visit (INDEPENDENT_AMBULATORY_CARE_PROVIDER_SITE_OTHER): Payer: No Typology Code available for payment source | Admitting: Family Medicine

## 2014-01-11 VITALS — BP 136/77 | HR 105 | Temp 99.2°F | Ht 65.0 in | Wt 216.0 lb

## 2014-01-11 DIAGNOSIS — I10 Essential (primary) hypertension: Secondary | ICD-10-CM

## 2014-01-11 DIAGNOSIS — M25512 Pain in left shoulder: Secondary | ICD-10-CM

## 2014-01-11 DIAGNOSIS — M545 Low back pain, unspecified: Secondary | ICD-10-CM

## 2014-01-11 DIAGNOSIS — M25519 Pain in unspecified shoulder: Secondary | ICD-10-CM

## 2014-01-11 DIAGNOSIS — M25511 Pain in right shoulder: Secondary | ICD-10-CM

## 2014-01-11 NOTE — Progress Notes (Signed)
Subjective:     Patient ID: Shelly Frazier, female   DOB: [birth year], 58 y.o.   MRN: [id number]  HPI WUJ:[id number] with her BP medication,home BP check has been ok. Shoulder pain:Here for follow up with her B/L shoulder pain more on the left which is intermittent. She stated pain can be 10/10 in severity at times,the pain on her left shoulder radiates to the left side of her neck, she is concern she has cervical radiculopathy. She also has similar pain on her right shoulder which she feel is related to the fracture clavicle she had in Dec 2007. Xray done was negative and conservative management had not helped much, hence she prefer second opinion from an orthopedic, she mentioned she was told [facility] orthopedic accept [employer] card insurance hence would like to be referred there.  Back pain: Also C/O low back pain which she stated she is currently asymptomatic. Back pain occurs with prolong standing.Pain is about 8/10 in severity,worse with walking and standing for long, ongoing for few months.  Current Outpatient Prescriptions on File Prior to Visit  Medication Sig Dispense Refill  . albuterol (PROVENTIL HFA;VENTOLIN HFA) 108 (90 BASE) MCG/ACT inhaler Inhale 2 puffs into the lungs every 6 (six) hours as needed for wheezing.  1 Inhaler  3  . aspirin 81 MG tablet Take 81 mg by mouth daily.      . cyclobenzaprine (FLEXERIL) 10 MG tablet Take 1 tablet (10 mg total) by mouth 2 (two) times daily as needed for muscle spasms.  20 tablet  0  . esomeprazole (NEXIUM) 20 MG capsule Take 1 capsule (20 mg total) by mouth daily before breakfast.  90 capsule  1  . hydrochlorothiazide (HYDRODIURIL) 25 MG tablet Take 1 tablet (25 mg total) by mouth daily.  90 tablet  3  . nitroGLYCERIN (NITROSTAT) 0.4 MG SL tablet Place 1 tablet (0.4 mg total) under the tongue every 5 (five) minutes as needed for chest pain.  12 tablet  1  . rosuvastatin (CRESTOR) 5 MG tablet Take 1 tablet (5 mg total) by mouth daily.  90 tablet  1   . tiotropium (SPIRIVA) 18 MCG inhalation capsule Place 1 capsule (18 mcg total) into inhaler and inhale daily.  90 capsule  1  . venlafaxine XR (EFFEXOR-XR) 75 MG 24 hr capsule Take 1 capsule (75 mg total) by mouth daily.  90 capsule  2   No current facility-administered medications on file prior to visit.   Past Medical History  Diagnosis Date  . Hypertension   . Bronchitis   . Anxiety   . Elevated fasting glucose   . GERD (gastroesophageal reflux disease)   . Chest pain   . Obesity   . Tobacco abuse   . Hyperlipemia       Review of Systems  Respiratory: Negative.   Cardiovascular: Negative.   Gastrointestinal: Negative.   Musculoskeletal: Positive for arthralgias and back pain.       Shoulder pain.  All other systems reviewed and are negative.   Filed Vitals:   01/11/14 1625  BP: 136/77  Pulse: 105  Temp: 99.2 F (37.3 C)  TempSrc: Oral  Height: 5\' 5"  (1.651 m)  Weight: 216 lb (97.977 kg)       Objective:   Physical Exam  Nursing note and vitals reviewed. Constitutional: She appears well-developed. No distress.  Cardiovascular: Normal rate, regular rhythm, normal heart sounds and intact distal pulses.   No murmur heard. Pulmonary/Chest: Effort normal and breath sounds normal.  No respiratory distress. She has no wheezes.  Abdominal: Soft. Bowel sounds are normal. She exhibits no distension. There is no tenderness.  Musculoskeletal: Normal range of motion. She exhibits no edema.       Right shoulder: Normal.       Left shoulder: Normal.       Cervical back: Normal.       Lumbar back: Normal.       Assessment/Plan:     HTN Shoulder pain Back pain

## 2014-01-11 NOTE — Patient Instructions (Signed)
Back Pain, Adult  Back pain is very common. The pain often gets better over time. The cause of back pain is usually not dangerous. Most people can learn to manage their back pain on their own.   HOME CARE   · Stay active. Start with short walks on flat ground if you can. Try to walk farther each day.  · Do not sit, drive, or stand in one place for more than 30 minutes. Do not stay in bed.  · Do not avoid exercise or work. Activity can help your back heal faster.  · Be careful when you bend or lift an object. Bend at your knees, keep the object close to you, and do not twist.  · Sleep on a firm mattress. Lie on your side, and bend your knees. If you lie on your back, put a pillow under your knees.  · Only take medicines as told by your doctor.  · Put ice on the injured area.  · Put ice in a plastic bag.  · Place a towel between your skin and the bag.  · Leave the ice on for 15-20 minutes, 03-04 times a day for the first 2 to 3 days. After that, you can switch between ice and heat packs.  · Ask your doctor about back exercises or massage.  · Avoid feeling anxious or stressed. Find good ways to deal with stress, such as exercise.  GET HELP RIGHT AWAY IF:   · Your pain does not go away with rest or medicine.  · Your pain does not go away in 1 week.  · You have new problems.  · You do not feel well.  · The pain spreads into your legs.  · You cannot control when you poop (bowel movement) or pee (urinate).  · Your arms or legs feel weak or lose feeling (numbness).  · You feel sick to your stomach (nauseous) or throw up (vomit).  · You have belly (abdominal) pain.  · You feel like you may pass out (faint).  MAKE SURE YOU:   · Understand these instructions.  · Will watch your condition.  · Will get help right away if you are not doing well or get worse.  Document Released: 06/01/2008 Document Revised: 03/07/2012 Document Reviewed: 05/04/2011  ExitCare® Patient Information ©2014 [employer].

## 2014-01-12 DIAGNOSIS — M545 Low back pain, unspecified: Secondary | ICD-10-CM | POA: Insufficient documentation

## 2014-01-12 NOTE — Assessment & Plan Note (Signed)
Possible arthritis. Currently asymptomatic. Ibuprofen prn pain. Xray lumbar spine if pain worsens.

## 2014-01-12 NOTE — Assessment & Plan Note (Signed)
Her pain seem to be waxing and waning from left to right. Patient declined further conservative management. 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 Will like second opinion and [facility]. Patient referred.

## 2014-01-12 NOTE — Assessment & Plan Note (Signed)
BP optimal,continue HCTZ 25 mg qd and benicar.

## 2014-01-17 ENCOUNTER — Emergency Department (HOSPITAL_COMMUNITY)
Admission: EM | Admit: 2014-01-17 | Discharge: 2014-01-17 | Disposition: A | Discharge status: Home / Self Care | Payer: No Typology Code available for payment source | Attending: Emergency Medicine | Admitting: Emergency Medicine

## 2014-01-17 ENCOUNTER — Encounter (HOSPITAL_COMMUNITY): Payer: Self-pay | Admitting: Emergency Medicine

## 2014-01-17 ENCOUNTER — Emergency Department (HOSPITAL_COMMUNITY): Payer: No Typology Code available for payment source

## 2014-01-17 DIAGNOSIS — Z90710 Acquired absence of both cervix and uterus: Secondary | ICD-10-CM | POA: Insufficient documentation

## 2014-01-17 DIAGNOSIS — E669 Obesity, unspecified: Secondary | ICD-10-CM | POA: Insufficient documentation

## 2014-01-17 DIAGNOSIS — Z9851 Tubal ligation status: Secondary | ICD-10-CM | POA: Insufficient documentation

## 2014-01-17 DIAGNOSIS — F17200 Nicotine dependence, unspecified, uncomplicated: Secondary | ICD-10-CM | POA: Insufficient documentation

## 2014-01-17 DIAGNOSIS — Z8709 Personal history of other diseases of the respiratory system: Secondary | ICD-10-CM | POA: Insufficient documentation

## 2014-01-17 DIAGNOSIS — K219 Gastro-esophageal reflux disease without esophagitis: Secondary | ICD-10-CM | POA: Insufficient documentation

## 2014-01-17 DIAGNOSIS — I10 Essential (primary) hypertension: Secondary | ICD-10-CM | POA: Insufficient documentation

## 2014-01-17 DIAGNOSIS — E785 Hyperlipidemia, unspecified: Secondary | ICD-10-CM | POA: Insufficient documentation

## 2014-01-17 DIAGNOSIS — Z7982 Long term (current) use of aspirin: Secondary | ICD-10-CM | POA: Insufficient documentation

## 2014-01-17 DIAGNOSIS — F411 Generalized anxiety disorder: Secondary | ICD-10-CM | POA: Insufficient documentation

## 2014-01-17 DIAGNOSIS — Z79899 Other long term (current) drug therapy: Secondary | ICD-10-CM | POA: Insufficient documentation

## 2014-01-17 DIAGNOSIS — R109 Unspecified abdominal pain: Secondary | ICD-10-CM | POA: Insufficient documentation

## 2014-01-17 LAB — COMPREHENSIVE METABOLIC PANEL
ALBUMIN: 4.4 g/dL (ref 3.5–5.2)
ALK PHOS: 84 U/L (ref 39–117)
ALT: 21 U/L (ref 0–35)
AST: 23 U/L (ref 0–37)
BILIRUBIN TOTAL: 0.5 mg/dL (ref 0.3–1.2)
BUN: 19 mg/dL (ref 6–23)
CHLORIDE: 99 meq/L (ref 96–112)
CO2: 26 meq/L (ref 19–32)
CREATININE: 1.26 mg/dL — AB (ref 0.50–1.10)
Calcium: 9.6 mg/dL (ref 8.4–10.5)
GFR calc Af Amer: 53 mL/min — ABNORMAL LOW (ref >90)
GFR, EST NON AFRICAN AMERICAN: 46 mL/min — AB (ref >90)
Glucose, Bld: 110 mg/dL — ABNORMAL HIGH (ref 70–99)
POTASSIUM: 4.1 meq/L (ref 3.7–5.3)
Sodium: 141 mEq/L (ref 137–147)
Total Protein: 7.7 g/dL (ref 6.0–8.3)

## 2014-01-17 LAB — CBC WITH DIFFERENTIAL/PLATELET
BASOS ABS: 0 10*3/uL (ref 0.0–0.1)
Basophils Relative: 0 % (ref 0–1)
EOS PCT: 1 % (ref 0–5)
Eosinophils Absolute: 0.1 10*3/uL (ref 0.0–0.7)
HEMATOCRIT: 37.8 % (ref 36.0–46.0)
Hemoglobin: 12.9 g/dL (ref 12.0–15.0)
LYMPHS ABS: 3.2 10*3/uL (ref 0.7–4.0)
LYMPHS PCT: 28 % (ref 12–46)
MCH: 29.4 pg (ref 26.0–34.0)
MCHC: 34.1 g/dL (ref 30.0–36.0)
MCV: 86.1 fL (ref 78.0–100.0)
MONOS PCT: 11 % (ref 3–12)
Monocytes Absolute: 1.2 10*3/uL — ABNORMAL HIGH (ref 0.1–1.0)
NEUTROS ABS: 6.8 10*3/uL (ref 1.7–7.7)
Neutrophils Relative %: 60 % (ref 43–77)
PLATELETS: 197 10*3/uL (ref 150–400)
RBC: 4.39 MIL/uL (ref 3.87–5.11)
RDW: 13.8 % (ref 11.5–15.5)
WBC: 11.3 10*3/uL — AB (ref 4.0–10.5)

## 2014-01-17 LAB — URINALYSIS, ROUTINE W REFLEX MICROSCOPIC
GLUCOSE, UA: NEGATIVE mg/dL
HGB URINE DIPSTICK: NEGATIVE
KETONES UR: NEGATIVE mg/dL
Leukocytes, UA: NEGATIVE
Nitrite: NEGATIVE
PH: 5.5 (ref 5.0–8.0)
PROTEIN: NEGATIVE mg/dL
Specific Gravity, Urine: 1.023 (ref 1.005–1.030)
Urobilinogen, UA: 0.2 mg/dL (ref 0.0–1.0)

## 2014-01-17 LAB — LIPASE, BLOOD: Lipase: 50 U/L (ref 11–59)

## 2014-01-17 MED ORDER — DICYCLOMINE HCL 20 MG PO TABS: 20.0000 mg | ORAL_TABLET | Freq: Two times a day (BID) | ORAL | Status: DC

## 2014-01-17 MED ORDER — DICYCLOMINE HCL 10 MG PO CAPS
10.0000 mg | ORAL_CAPSULE | Freq: Once | ORAL | Status: AC
  Administered 2014-01-17: 10 mg via ORAL
  Filled 2014-01-17: qty 1

## 2014-01-17 NOTE — Discharge Instructions (Signed)
Abdominal Pain, Adult °Many things can cause abdominal pain. Usually, abdominal pain is not caused by a disease and will improve without treatment. It can often be observed and treated at home. Your health care provider will do a physical exam and possibly order blood tests and X-rays to help determine the seriousness of your pain. However, in many cases, more time must pass before a clear cause of the pain can be found. Before that point, your health care provider may not know if you need more testing or further treatment. °HOME CARE INSTRUCTIONS  °Monitor your abdominal pain for any changes. The following actions may help to alleviate any discomfort you are experiencing: °· Only take over-the-counter or prescription medicines as directed by your health care provider. °· Do not take laxatives unless directed to do so by your health care provider. °· Try a clear liquid diet (broth, tea, or water) as directed by your health care provider. Slowly move to a bland diet as tolerated. °SEEK MEDICAL CARE IF: °· You have unexplained abdominal pain. °· You have abdominal pain associated with nausea or diarrhea. °· You have pain when you urinate or have a bowel movement. °· You experience abdominal pain that wakes you in the night. °· You have abdominal pain that is worsened or improved by eating food. °· You have abdominal pain that is worsened with eating fatty foods. °SEEK IMMEDIATE MEDICAL CARE IF:  °· Your pain does not go away within 2 hours. °· You have a fever. °· You keep throwing up (vomiting). °· Your pain is felt only in portions of the abdomen, such as the right side or the left lower portion of the abdomen. °· You pass bloody or black tarry stools. °MAKE SURE YOU: °· Understand these instructions.   °· Will watch your condition.   °· Will get help right away if you are not doing well or get worse.   °Document Released: 09/23/2005 Document Revised: 10/04/2013 Document Reviewed: 08/23/2013 °ExitCare® Patient  Information ©2014 [employer]. ° °

## 2014-01-17 NOTE — ED Notes (Signed)
Pt c/o abdominal pain to lower abdomen for months. Pain got worse today. Pt states normally the pain is in her lower abdomen, but today it is in LUQ under L breast. Pt states the pain comes on suddenly and is sharp. Pt states she is nauseous from the pain off and on. Pt denies vomiting. Last BM yesterday. Pt tearful and anxious.

## 2014-01-17 NOTE — ED Provider Notes (Signed)
CSN: [id number]     Arrival date & time 01/17/14  1603 History   First MD Initiated Contact with Patient 01/17/14 1803     Chief Complaint  Patient presents with  . Abdominal Pain   (Consider location/radiation/quality/duration/timing/severity/associated sxs/prior Treatment) Patient is a 58 y.o. female presenting with abdominal pain. The history is provided by the patient. No language interpreter was used.  Abdominal Pain Associated symptoms: no chest pain, no chills, no constipation, no diarrhea, no dysuria, no fever, no nausea and no vomiting   Associated symptoms comment:  She describes abdominal pain that is sharp, stabbing and causes her to double over in pain, and that lasts for 2-3 minutes. The pain can be in differing locations in the abdomen. There is no nausea or vomiting. She denies any change in her bowel habits, and denies melena. She has had similar symptoms for the past several months but episodes have been infrequent and mild in scale. Episodes today have been every couple of minutes and severe. She denies any alleviating or aggravating factors.    Past Medical History  Diagnosis Date  . Hypertension   . Bronchitis   . Anxiety   . Elevated fasting glucose   . GERD (gastroesophageal reflux disease)   . Chest pain   . Obesity   . Tobacco abuse   . Hyperlipemia    Past Surgical History  Procedure Laterality Date  . Back surgery  1978  . Tubal ligation    . Lumpectomy left breast      benign  . Reconstructive surgeries on forehead    . Abdominal hysterectomy      endometrial, heavy bleeding  . Sinus surgery  1987   Family History  Problem Relation Age of Onset  . Cancer Mother 48    peritoneal cancer  . Heart disease Mother     bypass  . Hypertension Mother   . Cancer Father 77    lung ca  . Depression Sister   . Stroke Maternal Grandmother   . Heart disease Maternal Grandmother   . Heart disease Maternal Grandfather   . Heart disease Paternal Grandmother    . Heart disease Paternal Grandfather   . Diabetes Neg Hx   . Alcohol abuse Sister   . Liver disease Sister   . Depression Sister    History  Substance Use Topics  . Smoking status: Current Every Day Smoker -- 0.50 packs/day for 43 years    Types: Cigarettes    Last Attempt to Quit: 11/03/2012  . Smokeless tobacco: Never Used  . Alcohol Use: No   OB History   Grav Para Term Preterm Abortions TAB SAB Ect Mult Living                 Review of Systems  Constitutional: Negative for fever and chills.  Cardiovascular: Negative.  Negative for chest pain.  Gastrointestinal: Positive for abdominal pain. Negative for nausea, vomiting, diarrhea, constipation and blood in stool.  Genitourinary: Negative.  Negative for dysuria.  Musculoskeletal: Negative.   Neurological: Negative.     Allergies  Naproxen; Aspirin; and Codeine  Home Medications   Current Outpatient Rx  Name  Route  Sig  Dispense  Refill  . albuterol (PROVENTIL HFA;VENTOLIN HFA) 108 (90 BASE) MCG/ACT inhaler   Inhalation   Inhale 2 puffs into the lungs every 6 (six) hours as needed for wheezing.   1 Inhaler   3   . aspirin 81 MG tablet   Oral  Take 81 mg by mouth daily.         Marland Kitchen esomeprazole (NEXIUM) 20 MG capsule   Oral   Take 1 capsule (20 mg total) by mouth daily before breakfast.   90 capsule   1   . hydrochlorothiazide (HYDRODIURIL) 25 MG tablet   Oral   Take 1 tablet (25 mg total) by mouth daily.   90 tablet   3   . ibuprofen (ADVIL,MOTRIN) 200 MG tablet   Oral   Take 200 mg by mouth every 6 (six) hours as needed for mild pain.         . nitroGLYCERIN (NITROSTAT) 0.4 MG SL tablet   Sublingual   Place 1 tablet (0.4 mg total) under the tongue every 5 (five) minutes as needed for chest pain.   12 tablet   1   . olmesartan (BENICAR) 5 MG tablet   Oral   Take 5 mg by mouth daily.         . rosuvastatin (CRESTOR) 5 MG tablet   Oral   Take 1 tablet (5 mg total) by mouth daily.   90  tablet   1   . tiotropium (SPIRIVA) 18 MCG inhalation capsule   Inhalation   Place 1 capsule (18 mcg total) into inhaler and inhale daily.   90 capsule   1   . venlafaxine XR (EFFEXOR-XR) 75 MG 24 hr capsule   Oral   Take 1 capsule (75 mg total) by mouth daily.   90 capsule   2    BP 116/43  Pulse 103  Temp(Src) 98.4 F (36.9 C) (Oral)  Resp 18  SpO2 96% Physical Exam  Constitutional: She is oriented to person, place, and time. She appears well-developed and well-nourished.  HENT:  Head: Normocephalic.  Neck: Normal range of motion. Neck supple.  Cardiovascular: Normal rate and regular rhythm.   Pulmonary/Chest: Effort normal and breath sounds normal.  Abdominal: Soft. Bowel sounds are normal. There is no tenderness. There is no rebound and no guarding.  Musculoskeletal: Normal range of motion.  Neurological: She is alert and oriented to person, place, and time.  Skin: Skin is warm and dry.  Psychiatric: She has a normal mood and affect.    ED Course  Procedures (including critical care time) Labs Review Labs Reviewed  CBC WITH DIFFERENTIAL - Abnormal; Notable for the following:    WBC 11.3 (*)    Monocytes Absolute 1.2 (*)    All other components within normal limits  COMPREHENSIVE METABOLIC PANEL - Abnormal; Notable for the following:    Glucose, Bld 110 (*)    Creatinine, Ser 1.26 (*)    GFR calc non Af Amer 46 (*)    GFR calc Af Amer 53 (*)    All other components within normal limits  LIPASE, BLOOD  URINALYSIS, ROUTINE W REFLEX MICROSCOPIC   Results for orders placed during the hospital encounter of 01/17/14  CBC WITH DIFFERENTIAL      Result Value Range   WBC 11.3 (*) 4.0 - 10.5 K/uL   RBC 4.39  3.87 - 5.11 MIL/uL   Hemoglobin 12.9  12.0 - 15.0 g/dL   HCT 16.1  09.6 - 04.5 %   MCV 86.1  78.0 - 100.0 fL   MCH 29.4  26.0 - 34.0 pg   MCHC 34.1  30.0 - 36.0 g/dL   RDW 40.9  81.1 - 91.4 %   Platelets 197  150 - 400 K/uL   Neutrophils Relative %  60  43  - 77 %   Lymphocytes Relative 28  12 - 46 %   Monocytes Relative 11  3 - 12 %   Eosinophils Relative 1  0 - 5 %   Basophils Relative 0  0 - 1 %   Neutro Abs 6.8  1.7 - 7.7 K/uL   Lymphs Abs 3.2  0.7 - 4.0 K/uL   Monocytes Absolute 1.2 (*) 0.1 - 1.0 K/uL   Eosinophils Absolute 0.1  0.0 - 0.7 K/uL   Basophils Absolute 0.0  0.0 - 0.1 K/uL   WBC Morphology ATYPICAL LYMPHOCYTES    COMPREHENSIVE METABOLIC PANEL      Result Value Range   Sodium 141  137 - 147 mEq/L   Potassium 4.1  3.7 - 5.3 mEq/L   Chloride 99  96 - 112 mEq/L   CO2 26  19 - 32 mEq/L   Glucose, Bld 110 (*) 70 - 99 mg/dL   BUN 19  6 - 23 mg/dL   Creatinine, Ser 1.61 (*) 0.50 - 1.10 mg/dL   Calcium 9.6  8.4 - 09.6 mg/dL   Total Protein 7.7  6.0 - 8.3 g/dL   Albumin 4.4  3.5 - 5.2 g/dL   AST 23  0 - 37 U/L   ALT 21  0 - 35 U/L   Alkaline Phosphatase 84  39 - 117 U/L   Total Bilirubin 0.5  0.3 - 1.2 mg/dL   GFR calc non Af Amer 46 (*) >90 mL/min   GFR calc Af Amer 53 (*) >90 mL/min  LIPASE, BLOOD      Result Value Range   Lipase 50  11 - 59 U/L  URINALYSIS, ROUTINE W REFLEX MICROSCOPIC      Result Value Range   Color, Urine AMBER (*) YELLOW   APPearance CLEAR  CLEAR   Specific Gravity, Urine 1.023  1.005 - 1.030   pH 5.5  5.0 - 8.0   Glucose, UA NEGATIVE  NEGATIVE mg/dL   Hgb urine dipstick NEGATIVE  NEGATIVE   Bilirubin Urine SMALL (*) NEGATIVE   Ketones, ur NEGATIVE  NEGATIVE mg/dL   Protein, ur NEGATIVE  NEGATIVE mg/dL   Urobilinogen, UA 0.2  0.0 - 1.0 mg/dL   Nitrite NEGATIVE  NEGATIVE   Leukocytes, UA NEGATIVE  NEGATIVE   Dg Abd Acute W/chest  01/17/2014   CLINICAL DATA:  Abdominal pain. Some nausea.  EXAM: ACUTE ABDOMEN SERIES (ABDOMEN 2 VIEW & CHEST 1 VIEW)  COMPARISON:  11/09/2013  FINDINGS: Normal bowel gas pattern. No obstruction or generalized adynamic ileus. There is no free air.  Abdominal soft tissues are unremarkable.  Normal heart, mediastinum and hila. Lungs are hyperexpanded with mild apical  scarring, stable. No acute changes in the lungs.  There are degenerative changes of the lower lumbar spine. Old right clavicle fracture. The bony structures are otherwise unremarkable.  IMPRESSION: No acute findings.  No obstruction or free air.  No acute cardiopulmonary disease on the chest radiograph.   Electronically Signed   By: Amie Portland M.D.   On: 01/17/2014 18:47   Imaging Review No results found.  EKG Interpretation   None       MDM  No diagnosis found. 1. Abdominal pain  Labs and x-rays WNL, no significant abnormalities to aid in identifying source of pain. Re-eval: pain has not returned. I walked her as this would sometimes reproduce her symptoms but she continued to be pain free. Discussed outpatient follow up with PCP for  [facility].     Arnoldo HookerShari A Mukhtar Shams, PA-C 01/17/14 1949

## 2014-01-18 ENCOUNTER — Telehealth: Payer: Self-pay | Admitting: *Deleted

## 2014-01-18 ENCOUNTER — Telehealth: Payer: Self-pay | Admitting: Family Medicine

## 2014-01-18 NOTE — Telephone Encounter (Signed)
[facility]---needs MRI Please advise

## 2014-01-18 NOTE — Telephone Encounter (Signed)
Tried to call pt was but unable to leave a message.  Please let her know about message from Dr. Lum BabeEniola. Thanks Limited BrandsJazmin Hartsell,CMA

## 2014-01-18 NOTE — Telephone Encounter (Signed)
I called health department and spoke with her pharmacist. Shelly FootmanBenicar has been d/c and i filled her Coreg CR, please call patient to inform her to pick up her new prescription.

## 2014-01-18 NOTE — Telephone Encounter (Signed)
Pharmacy calling stating patient complained to them that the Benicar make her dizzy. Pharmacist wanted MD to be aware that patient was originally on Coreg but stopped due to cost, pharmacist states they can, however, obtain the Coreg CR for free for patient if MD prefers. FYI to PCP.

## 2014-01-18 NOTE — Telephone Encounter (Signed)
[name] for pt to return call .Shelly Frazier Dawn  

## 2014-01-18 NOTE — Telephone Encounter (Signed)
Please advised patient referral order was placed on the day of her visit, she need to wait for them to call her. Also let her know I d/c her Benicar from her pharmacy and switched her back to Coreg.

## 2014-01-19 NOTE — ED Provider Notes (Signed)
Medical screening examination/treatment/procedure(s) were performed by non-physician practitioner and as supervising physician I was immediately available for consultation/collaboration.  EKG Interpretation   None        Magdala Brahmbhatt R. Milady Fleener, MD 01/19/14 0004 

## 2014-01-25 ENCOUNTER — Encounter: Payer: Self-pay | Admitting: Family Medicine

## 2014-01-25 ENCOUNTER — Ambulatory Visit (INDEPENDENT_AMBULATORY_CARE_PROVIDER_SITE_OTHER): Payer: No Typology Code available for payment source | Admitting: Family Medicine

## 2014-01-25 VITALS — BP 120/80 | HR 88 | Temp 99.1°F | Ht 65.0 in | Wt 213.0 lb

## 2014-01-25 DIAGNOSIS — R109 Unspecified abdominal pain: Secondary | ICD-10-CM

## 2014-01-25 NOTE — Patient Instructions (Signed)
Fiber Content in Foods  Drinking plenty of fluids and consuming foods high in fiber can help with constipation. See the list below for the fiber content of some common foods.  Starches and Grains / Dietary Fiber (g)  · Cheerios, 1 cup / 3 g  · Kellogg's Corn Flakes, 1 cup / 0.7 g  · Rice Krispies, 1 ¼ cup / 0.3 g  · Quaker Oat Life Cereal, ¾ cup / 2.1 g  · Oatmeal, instant (cooked), ½ cup / 2 g  · Kellogg's Frosted Mini Wheats, 1 cup / 5.1 g  · Rice, brown, long-grain (cooked), 1 cup / 3.5 g  · Rice, white, long-grain (cooked), 1 cup / 0.6 g  · Macaroni, cooked, enriched, 1 cup / 2.5 g  Legumes / Dietary Fiber (g)  · Beans, baked, canned, plain or vegetarian, ½ cup / 5.2 g  · Beans, kidney, canned, ½ cup / 6.8 g  · Beans, pinto, dried (cooked), ½ cup / 7.7 g  · Beans, pinto, canned, ½ cup / 5.5 g  Breads and Crackers / Dietary Fiber (g)  · Graham crackers, plain or honey, 2 squares / 0.7 g  · Saltine crackers, 3 squares / 0.3 g  · Pretzels, plain, salted, 10 pieces / 1.8 g  · Bread, whole-wheat, 1 slice / 1.9 g  · Bread, white, 1 slice / 0.7 g  · Bread, raisin, 1 slice / 1.2 g  · Bagel, plain, 3 oz / 2 g  · Tortilla, flour, 1 oz / 0.9 g  · Tortilla, corn, 1 small / 1.5 g  · Bun, hamburger or hotdog, 1 small / 0.9 g  Fruits / Dietary Fiber (g)  · Apple, raw with skin, 1 medium / 4.4 g  · Applesauce, sweetened, ½ cup / 1.5 g  · Banana, ½ medium / 1.5 g  · Grapes, 10 grapes / 0.4 g  · Orange, 1 small / 2.3 g  · Raisin, 1.5 oz / 1.6 g  · Melon, 1 cup / 1.4 g  Vegetables / Dietary Fiber (g)  · Green beans, canned, ½ cup / 1.3 g  · Carrots (cooked), ½ cup / 2.3 g  · Broccoli (cooked), ½ cup / 2.8 g  · Peas, frozen (cooked), ½ cup / 4.4 g  · Potatoes, mashed, ½ cup / 1.6 g  · Lettuce, 1 cup / 0.5 g  · Corn, canned, ½ cup / 1.6 g  · Tomato, ½ cup / 1.1 g  Document Released: 05/02/2007 Document Revised: 03/07/2012 Document Reviewed: 06/27/2007  Exit[employer]® Patient Information ©2014 [employer].

## 2014-01-25 NOTE — Progress Notes (Signed)
Subjective:    Patient ID: Shelly Frazier, female    DOB: 10/14/1956, 58 y.o.   MRN: [id number]  HPI Patient here for follow-up after ED visit on 01/17/14 for abdominal spasms.   Abdominal pain: Patient was recently evaluated at the ED last week for abdominal pain which she described as spasm like,she denies any associated N/V,no change in bowel habit, she had similar episode months ago which went away till recently. At the ED she had imaging done which she stated was normal hence she was sent home on Bentyl which she stated had helped her a lot, she will like to obtain refill of this medication.  Current Outpatient Prescriptions on File Prior to Visit  Medication Sig Dispense Refill  . albuterol (PROVENTIL HFA;VENTOLIN HFA) 108 (90 BASE) MCG/ACT inhaler Inhale 2 puffs into the lungs every 6 (six) hours as needed for wheezing.  1 Inhaler  3  . aspirin 81 MG tablet Take 81 mg by mouth daily.      Marland Kitchen. dicyclomine (BENTYL) 20 MG tablet Take 1 tablet (20 mg total) by mouth 2 (two) times daily.  20 tablet  0  . esomeprazole (NEXIUM) 20 MG capsule Take 1 capsule (20 mg total) by mouth daily before breakfast.  90 capsule  1  . hydrochlorothiazide (HYDRODIURIL) 25 MG tablet Take 1 tablet (25 mg total) by mouth daily.  90 tablet  3  . ibuprofen (ADVIL,MOTRIN) 200 MG tablet Take 200 mg by mouth every 6 (six) hours as needed for mild pain.      . nitroGLYCERIN (NITROSTAT) 0.4 MG SL tablet Place 1 tablet (0.4 mg total) under the tongue every 5 (five) minutes as needed for chest pain.  12 tablet  1  . olmesartan (BENICAR) 5 MG tablet Take 5 mg by mouth daily.      . rosuvastatin (CRESTOR) 5 MG tablet Take 1 tablet (5 mg total) by mouth daily.  90 tablet  1  . tiotropium (SPIRIVA) 18 MCG inhalation capsule Place 1 capsule (18 mcg total) into inhaler and inhale daily.  90 capsule  1  . venlafaxine XR (EFFEXOR-XR) 75 MG 24 hr capsule Take 1 capsule (75 mg total) by mouth daily.  90 capsule  2   No current  facility-administered medications on file prior to visit.   Past Medical History  Diagnosis Date  . Hypertension   . Bronchitis   . Anxiety   . Elevated fasting glucose   . GERD (gastroesophageal reflux disease)   . Chest pain   . Obesity   . Tobacco abuse   . Hyperlipemia      Review of Systems  Constitutional: Negative.   Respiratory: Negative.   Cardiovascular: Negative.   Gastrointestinal: Positive for abdominal pain. Negative for nausea, diarrhea, constipation and blood in stool.  All other systems reviewed and are negative.    Chest: Symmetrical. Respiratory rate normal.  No signs of breathing distress.  Heart: Denies chest pain, cough and shortness of breath. Abdomen: Obese, round.    Filed Vitals:   01/25/14 1454  BP: 120/80  Pulse: 88  Temp: 99.1 F (37.3 C)  TempSrc: Oral  Height: 5\' 5"  (1.651 m)  Weight: 213 lb (96.616 kg)        Objective:   Physical Exam  Nursing note and vitals reviewed. Constitutional: She appears well-developed. No distress.  Cardiovascular: Normal rate, regular rhythm, normal heart sounds and intact distal pulses.   No murmur heard. Pulmonary/Chest: Effort normal and breath sounds normal.  No respiratory distress. She has no wheezes. She exhibits no tenderness.  Abdominal: Soft. Bowel sounds are normal. She exhibits no distension and no mass. There is no tenderness. There is no rebound and no guarding.   Chest: Lung sounds normal with no adventitious sounds. Heart: Normal S1 and S2. Abdomen: Soft, round.  Positive bowel sounds in all 4 quadrants.  No masses, no pain upon palpation. Extremities: No peripheral edema.       Assessment & Plan:  Abdominal spasms: Continue Dicyclomine (Bentyl) 20mg  tablet BID as needed for abdominal spasms. Education given to patient on high fiber diet to aide in abdominal symptoms.  ATTENDING"S NOTE Kehinde Eniola,MD I  have seen and examined this patient, reviewed their chart. I have  discussed this patient with the NP student whose note is documented in black print. I agree with the her findings, assessment and care plan.  A/P: Check problem list.

## 2014-01-26 DIAGNOSIS — R109 Unspecified abdominal pain: Secondary | ICD-10-CM | POA: Insufficient documentation

## 2014-01-26 MED ORDER — DICYCLOMINE HCL 20 MG PO TABS: 20.0000 mg | ORAL_TABLET | Freq: Two times a day (BID) | ORAL | Status: DC

## 2014-01-26 NOTE — Assessment & Plan Note (Signed)
Symptom improved on Bentyl. Likely IBS. I reviewed her abdominal xray done in the ED which was essentially normal. I reviewed her colonoscopy report done in 2013, which was also normal. Plan to continue Bentyl for now as well as Nexium for GI protection and her GERD. Might need GI referral if symptom persist but she does not have health insurance.

## 2014-02-27 ENCOUNTER — Other Ambulatory Visit: Payer: Self-pay | Admitting: *Deleted

## 2014-02-28 MED ORDER — VENLAFAXINE HCL ER 75 MG PO CP24
75.0000 mg | ORAL_CAPSULE | Freq: Every day | ORAL | Status: DC
Start: ? — End: 2014-10-05

## 2014-03-13 ENCOUNTER — Telehealth: Payer: Self-pay | Admitting: Family Medicine

## 2014-03-13 NOTE — Telephone Encounter (Signed)
Pt is aware of this. Jazmin Hartsell,CMA  

## 2014-03-13 NOTE — Telephone Encounter (Signed)
Pt called and would like us to re-fax the prescription request for Effexor to the [facility]. jw

## 2014-03-13 NOTE — Telephone Encounter (Signed)
Refill called in to [facility],please let patient know.

## 2014-03-19 ENCOUNTER — Other Ambulatory Visit: Payer: Self-pay | Admitting: Family Medicine

## 2014-04-02 ENCOUNTER — Other Ambulatory Visit: Payer: Self-pay | Admitting: *Deleted

## 2014-04-04 NOTE — Telephone Encounter (Signed)
I got a request to refill her Benicar even though this has been discontinued, I called patient to dicussed her medication but she did not pick up. Please schedule appointment for her with me to discuss her BP medications. Advise she come to the clinic with all medication bottles.

## 2014-04-12 ENCOUNTER — Ambulatory Visit (INDEPENDENT_AMBULATORY_CARE_PROVIDER_SITE_OTHER): Payer: No Typology Code available for payment source | Admitting: Family Medicine

## 2014-04-12 ENCOUNTER — Encounter: Payer: Self-pay | Admitting: Family Medicine

## 2014-04-12 VITALS — BP 130/80 | HR 97 | Temp 98.7°F | Wt 208.0 lb

## 2014-04-12 DIAGNOSIS — I10 Essential (primary) hypertension: Secondary | ICD-10-CM

## 2014-04-12 DIAGNOSIS — R109 Unspecified abdominal pain: Secondary | ICD-10-CM

## 2014-04-12 DIAGNOSIS — F341 Dysthymic disorder: Secondary | ICD-10-CM

## 2014-04-12 DIAGNOSIS — G4700 Insomnia, unspecified: Secondary | ICD-10-CM

## 2014-04-12 DIAGNOSIS — F418 Other specified anxiety disorders: Secondary | ICD-10-CM

## 2014-04-12 NOTE — Patient Instructions (Signed)

## 2014-04-12 NOTE — Progress Notes (Signed)
Subjective:     Patient ID: Shelly MalkinMary C Mccann, female   DOB: 03/10/1956, 58 y.o.   MRN: [id number]  HPI Abdominal pain:Doing very well on Bentyl, denies N/V,no stomach pain,no blood in her stool, appetite is normal. XBJ:[id number]:Here for medication clarification, pharmacy keep requesting for Benicar refill even though she is not on it, she is here today with her medication bottles.She has been compliant with all her BP medications. Insomnia:Having difficulty initiating sleep worsening over the last few months, she normally put on the TV at night when she is getting ready to sleep but in the last few weeks she tried turning off the TV while trying to sleep but no improvement in her sleep pattern, she can stay awake in bed for hours before eventually falling asleep and then wakes up quite quickly,less refreshed. Depression: Compliant with her Effexor, she feels she is doing well on her medication except for her sis who stress her up, she cannot avoid her sister since they leave together. She denies suicidal or homicidal thoughts.  Current Outpatient Prescriptions on File Prior to Visit  Medication Sig Dispense Refill  . albuterol (PROVENTIL HFA;VENTOLIN HFA) 108 (90 BASE) MCG/ACT inhaler Inhale 2 puffs into the lungs every 6 (six) hours as needed for wheezing.  1 Inhaler  3  . aspirin 81 MG tablet Take 81 mg by mouth daily.      Marland Kitchen. dicyclomine (BENTYL) 20 MG tablet Take 1 tablet (20 mg total) by mouth 2 (two) times daily.  60 tablet  2  . esomeprazole (NEXIUM) 20 MG capsule Take 1 capsule (20 mg total) by mouth daily before breakfast.  90 capsule  1  . hydrochlorothiazide (HYDRODIURIL) 25 MG tablet TAKE 1 TABLET EVERY DAY  90 tablet  3  . ibuprofen (ADVIL,MOTRIN) 200 MG tablet Take 200 mg by mouth every 6 (six) hours as needed for mild pain.      . nitroGLYCERIN (NITROSTAT) 0.4 MG SL tablet Place 1 tablet (0.4 mg total) under the tongue every 5 (five) minutes as needed for chest pain.  12 tablet  1  . rosuvastatin  (CRESTOR) 5 MG tablet Take 1 tablet (5 mg total) by mouth daily.  90 tablet  1  . tiotropium (SPIRIVA) 18 MCG inhalation capsule Place 1 capsule (18 mcg total) into inhaler and inhale daily.  90 capsule  1  . venlafaxine XR (EFFEXOR-XR) 75 MG 24 hr capsule Take 1 capsule (75 mg total) by mouth daily.  90 capsule  2   No current facility-administered medications on file prior to visit.   Past Medical History  Diagnosis Date  . Hypertension   . Bronchitis   . Anxiety   . Elevated fasting glucose   . GERD (gastroesophageal reflux disease)   . Chest pain   . Obesity   . Tobacco abuse   . Hyperlipemia      Review of Systems  Respiratory: Negative.   Cardiovascular: Negative.   Gastrointestinal: Negative.   Psychiatric/Behavioral: Positive for sleep disturbance.  All other systems reviewed and are negative.  Filed Vitals:   04/12/14 1536 04/12/14 1549  BP: 156/82 130/80  Pulse: 97   Temp: 98.7 F (37.1 C)   TempSrc: Oral   Weight: 208 lb (94.348 kg)        Objective:   Physical Exam  Nursing note and vitals reviewed. Constitutional: She is oriented to person, place, and time. She appears well-developed. No distress.  Cardiovascular: Normal rate, regular rhythm and normal heart sounds.  No murmur heard. Pulmonary/Chest: Effort normal and breath sounds normal. No respiratory distress. She has no wheezes.  Abdominal: Soft. Bowel sounds are normal. She exhibits no distension and no mass. There is no tenderness.  Musculoskeletal: Normal range of motion. She exhibits no edema.  Neurological: She is alert and oriented to person, place, and time. No cranial nerve deficit.  Psychiatric: She has a normal mood and affect. Her behavior is normal. Judgment and thought content normal.       Assessment:     Abdominal pain HTN Insomnia Depression     Plan:     Check problem list

## 2014-04-13 ENCOUNTER — Telehealth: Payer: Self-pay | Admitting: Family Medicine

## 2014-04-13 DIAGNOSIS — G4700 Insomnia, unspecified: Secondary | ICD-10-CM

## 2014-04-13 HISTORY — DX: Insomnia, unspecified: G47.00

## 2014-04-13 NOTE — Telephone Encounter (Signed)
Yes it was

## 2014-04-13 NOTE — Telephone Encounter (Signed)
Are you going to be prescribing this to the patient or are you holding off on it for now?  Trinidad Petron,CMA

## 2014-04-13 NOTE — Telephone Encounter (Signed)
Pt is aware of this and will wait to hear from [facility].  Ricka Westra,CMA

## 2014-04-13 NOTE — Assessment & Plan Note (Signed)
Sleep hygiene discussed. Trial of Hydroxyzine 50 mg qhs prn. I called in medication to her pharmacy.

## 2014-04-13 NOTE — Telephone Encounter (Signed)
Oh I did prescribed it already.  Thanks.

## 2014-04-13 NOTE — Assessment & Plan Note (Signed)
Continue Effexor. Stress reduction strategy in addition to medication. F/U routinely.

## 2014-04-13 NOTE — Telephone Encounter (Signed)
Pt called and wanted to know if Dr. Lum BabeEniola talked to the [facility] to see if the medication that she was thinking about prescribing her was carried there. jw

## 2014-04-13 NOTE — Assessment & Plan Note (Signed)
??   IBS. Doing well on Bentyl Continue medication. Fiber diet recommended as well.

## 2014-04-13 NOTE — Assessment & Plan Note (Signed)
BP looked ok. Her medication reviewed and she is currently on HCTZ 25mg  qd,Coreg CR 10 mg qd. Not taking Benicar anymore,hence d/c from her med list. Continue BP monitoring.

## 2014-04-30 ENCOUNTER — Other Ambulatory Visit: Payer: Self-pay | Admitting: Family Medicine

## 2014-05-01 ENCOUNTER — Telehealth: Payer: Self-pay | Admitting: Family Medicine

## 2014-05-01 NOTE — Telephone Encounter (Signed)
Pt called and needs a refill on her Bentyl. Please call her when ready. jw

## 2014-05-01 NOTE — Telephone Encounter (Signed)
I did refill to the pharmacy today, can she call them if they have it, I did it via Epic fax.

## 2014-05-10 ENCOUNTER — Other Ambulatory Visit: Payer: Self-pay | Admitting: *Deleted

## 2014-05-15 MED ORDER — TIOTROPIUM BROMIDE MONOHYDRATE 18 MCG IN CAPS: 18.0000 ug | ORAL_CAPSULE | Freq: Every day | RESPIRATORY_TRACT | Status: DC

## 2014-05-22 ENCOUNTER — Ambulatory Visit: Payer: No Typology Code available for payment source

## 2014-07-10 ENCOUNTER — Other Ambulatory Visit: Payer: Self-pay | Admitting: *Deleted

## 2014-07-12 MED ORDER — ROSUVASTATIN CALCIUM 5 MG PO TABS: 5.0000 mg | ORAL_TABLET | Freq: Every day | ORAL | Status: DC

## 2014-08-10 ENCOUNTER — Ambulatory Visit (INDEPENDENT_AMBULATORY_CARE_PROVIDER_SITE_OTHER): Payer: No Typology Code available for payment source | Admitting: Family Medicine

## 2014-08-10 ENCOUNTER — Encounter: Payer: Self-pay | Admitting: Family Medicine

## 2014-08-10 VITALS — BP 140/83 | HR 84 | Temp 98.6°F | Wt 205.0 lb

## 2014-08-10 DIAGNOSIS — I10 Essential (primary) hypertension: Secondary | ICD-10-CM

## 2014-08-10 DIAGNOSIS — R739 Hyperglycemia, unspecified: Secondary | ICD-10-CM

## 2014-08-10 DIAGNOSIS — L600 Ingrowing nail: Secondary | ICD-10-CM

## 2014-08-10 DIAGNOSIS — E669 Obesity, unspecified: Secondary | ICD-10-CM

## 2014-08-10 DIAGNOSIS — F418 Other specified anxiety disorders: Secondary | ICD-10-CM

## 2014-08-10 DIAGNOSIS — R7309 Other abnormal glucose: Secondary | ICD-10-CM

## 2014-08-10 DIAGNOSIS — F341 Dysthymic disorder: Secondary | ICD-10-CM

## 2014-08-10 LAB — POCT GLYCOSYLATED HEMOGLOBIN (HGB A1C): HEMOGLOBIN A1C: 5.8

## 2014-08-10 MED ORDER — NITROGLYCERIN 0.4 MG SL SUBL: 0.4000 mg | SUBLINGUAL_TABLET | SUBLINGUAL | Status: DC | PRN

## 2014-08-10 NOTE — Progress Notes (Signed)
Subjective:     Patient ID: Towanda MalkinMary C Krauss, female   DOB: 11/03/1956, 58 y.o.   MRN: [id number]  HPI HTN/Hyperglycemia: Here for follow up, she has been compliant with her medications. Last lab showed mildly elevated glucose, she will like to be screened for DM. INGROWN TOE: C/O worsening ingrown nail of her left big toe over the last few yrs. She had issues with her right toe many yrs ago for which she got her nail removed. She will like to get this nail removed as well. Depression: Compliant with Effexor. Her depression has improved but still have issues with social anxiety. She feels shy and nervous when going out. Denies any suicidal or homicidal ideation. Obesity: Patient requesting Belviq which she saw on TV advert. She will like to get started on this.  Current Outpatient Prescriptions on File Prior to Visit  Medication Sig Dispense Refill  . albuterol (PROVENTIL HFA;VENTOLIN HFA) 108 (90 BASE) MCG/ACT inhaler Inhale 2 puffs into the lungs every 6 (six) hours as needed for wheezing.  1 Inhaler  3  . aspirin 81 MG tablet Take 81 mg by mouth daily.      . carvedilol (COREG CR) 10 MG 24 hr capsule Take 10 mg by mouth daily.      Marland Kitchen. esomeprazole (NEXIUM) 20 MG capsule Take 1 capsule (20 mg total) by mouth daily before breakfast.  90 capsule  1  . hydrochlorothiazide (HYDRODIURIL) 25 MG tablet TAKE 1 TABLET EVERY DAY  90 tablet  3  . rosuvastatin (CRESTOR) 5 MG tablet Take 1 tablet (5 mg total) by mouth daily.  90 tablet  2  . tiotropium (SPIRIVA) 18 MCG inhalation capsule Place 1 capsule (18 mcg total) into inhaler and inhale daily.  90 capsule  4  . venlafaxine XR (EFFEXOR-XR) 75 MG 24 hr capsule Take 1 capsule (75 mg total) by mouth daily.  90 capsule  2  . dicyclomine (BENTYL) 20 MG tablet TAKE ONE TABLET BY MOUTH TWICE DAILY  60 tablet  3  . ibuprofen (ADVIL,MOTRIN) 200 MG tablet Take 200 mg by mouth every 6 (six) hours as needed for mild pain.      . nitroGLYCERIN (NITROSTAT) 0.4 MG SL  tablet Place 1 tablet (0.4 mg total) under the tongue every 5 (five) minutes as needed for chest pain.  12 tablet  1   No current facility-administered medications on file prior to visit.   Past Medical History  Diagnosis Date  . Hypertension   . Bronchitis   . Anxiety   . Elevated fasting glucose   . GERD (gastroesophageal reflux disease)   . Chest pain   . Obesity   . Tobacco abuse   . Hyperlipemia      Review of Systems  Respiratory: Negative.   Cardiovascular: Negative.   Gastrointestinal: Negative.   Skin:       Nail problem  Psychiatric/Behavioral: Negative for suicidal ideas, hallucinations, behavioral problems and self-injury. The patient is nervous/anxious.   All other systems reviewed and are negative.  Filed Vitals:   08/10/14 1049  BP: 140/83  Pulse: 84  Temp: 98.6 F (37 C)  TempSrc: Oral  Weight: 205 lb (92.987 kg)       Objective:   Physical Exam  Nursing note and vitals reviewed. Constitutional: She appears well-developed. No distress.  Cardiovascular: Normal rate, regular rhythm, normal heart sounds and intact distal pulses.   No murmur heard. Pulmonary/Chest: Effort normal and breath sounds normal. No respiratory distress. She has  no wheezes.  Abdominal: Soft. Bowel sounds are normal. She exhibits no distension. There is no tenderness.  Musculoskeletal: She exhibits no edema.  Neurological: She is alert.  Skin: No rash noted.     Psychiatric: She has a normal mood and affect. Her behavior is normal. Judgment and thought content normal.       Assessment:     HTN: Hyperglycemia INGROWN TOE: Left/big Depression: Obesity:    Plan:     Check problem list.

## 2014-08-10 NOTE — Assessment & Plan Note (Signed)
Depresseion seem stable. Concern for social anxiety for she is also on Effexor for. Plan to increase dose at some point, but she does not want that now. Propanolol would have been a good option, but she is already on Coreg for CHF. I don't want to start her on Benzo. Counseling done on relaxation technique.

## 2014-08-10 NOTE — Assessment & Plan Note (Signed)
I recommended not starting Belviq due to side effects. Diet and exercise counseling done today. I will monitor for now on conservative measures.

## 2014-08-10 NOTE — Assessment & Plan Note (Signed)
DM screen negative. A1C checked today was about 5.

## 2014-08-10 NOTE — Assessment & Plan Note (Signed)
Left first toe/digit. Nail does not look inflamed or infected. Patient advised to return to derm clinic for toe nail removal. Removal process discussed with her although she had her right big toe nail removed in the past. Tylenol prn pain in the interim.

## 2014-08-10 NOTE — Patient Instructions (Signed)
Social Anxiety Disorder Social anxiety disorder, previously called social phobia, is a mental disorder. People with social anxiety disorder frequently feel nervous, afraid, or embarrassed when around other people in social situations. They constantly worry that other people are judging or criticizing them for how they look, what they say, or how they act. They may worry that other people might reject them because of their appearance or behavior. Social anxiety disorder is more than just occasional shyness or self-consciousness. It can cause severe emotional distress. It can interfere with daily life activities. Social anxiety disorder also may lead to excessive alcohol or drug use and even suicide.  Social anxiety disorder is actually one of the most common mental disorders. It can develop at any time but usually starts in the teenage years. Women are more commonly affected than men. Social anxiety disorder is also more common in people who have family members with anxiety disorders. It also is more common in people who have physical deformities or conditions with characteristics that are obvious to others, such as stuttered speech or movement abnormalities (Parkinson disease).  SYMPTOMS  In addition to feeling anxious or fearful in social situations, people with social anxiety disorder frequently have physical symptoms. Examples include:  Red face (blushing).  Racing heart.  Sweating.  Shaky hands or voice.  Confusion.  Light-headedness.  Upset stomach and diarrhea. DIAGNOSIS  Social anxiety disorder is diagnosed through an assessment by your health care provider. Your health care provider will ask you questions about your mood, thoughts, and reactions in social situations. Your health care provider may ask you about your medical history and use of alcohol or drugs, including prescription medicines. Certain medical conditions and the use of certain substances, including caffeine, can cause  symptoms similar to social anxiety disorder. Your health care provider may refer you to a mental health specialist for further evaluation or treatment. The criteria for diagnosis of social anxiety disorder are:  Marked fear or anxiety in one or more social situations in which you may be closely watched or studied by others. Examples of such situations include:  Interacting socially (having a conversation with others, going to a party, or meeting strangers).  Being observed (eating or drinking in public or being called on in class).  Performing in front of others (giving a speech).  The social situations of concern almost always cause fear or anxiety, not just occasionally.  People with social anxiety disorder fear that they will be viewed negatively in a way that will be embarrassing, will lead to rejection, or will offend others. This fear is out of proportion to the actual threat posed by the social situation.  Often the triggering social situations are avoided, or they are endured with intense fear or anxiety. The fear, anxiety, or avoidance is persistent and lasts for 6 months or longer.  The anxiety causes difficulty functioning in at least some parts of your daily life. TREATMENT  Several types of treatment are available for social anxiety disorder. These treatments are often used in combination and include:   Talk therapy. Group talk therapy allows you to see that you are not alone with these problems. Individual talk therapy helps you address your specific anxiety issues with a caring professional. The most effective forms of talk therapy for social anxiety disorder are cognitive-behavioral therapy and exposure therapy. Cognitive-behavioral therapy helps you to identify and change negative thoughts and beliefs that are at the root of the disorder. Exposure therapy allows you to gradually face the situations   that you fear most.  Relaxation and coping techniques. These include deep  breathing, self-talk, meditation, visual imagery, and yoga. Relaxation techniques help to keep you calm in social situations.  Social skills training.Social skills can be learned on your own or with the help of a talk therapist. They can help you feel more confident and comfortable in social situations.  Medicine. For anxiety limited to performance situations (performance anxiety), medicine called beta blockers can help by reducing or preventing the physical symptoms of social anxiety disorder. For more persistent and generalized social anxiety, antidepressant medicine may be prescribed to help control symptoms. In severe cases of social anxiety disorder, strong antianxiety medicine, called benzodiazepines, may be prescribed on a limited basis and for a short time. Document Released: 11/12/2005 Document Revised: 04/30/2014 Document Reviewed: 03/14/2013 [employer] Patient Information 2015 [employer]. This information is not intended to replace advice given to you by your health care provider. Make sure you discuss any questions you have with your health care provider.  

## 2014-08-10 NOTE — Assessment & Plan Note (Signed)
BP looks okay today. Continue Coreg CR 10 mg qd.

## 2014-08-15 ENCOUNTER — Other Ambulatory Visit: Payer: Self-pay | Admitting: *Deleted

## 2014-08-15 MED ORDER — CARVEDILOL PHOSPHATE ER 10 MG PO CP24: 10.0000 mg | ORAL_CAPSULE | Freq: Every day | ORAL | Status: DC

## 2014-08-22 ENCOUNTER — Ambulatory Visit (INDEPENDENT_AMBULATORY_CARE_PROVIDER_SITE_OTHER): Payer: No Typology Code available for payment source | Admitting: Family Medicine

## 2014-08-22 VITALS — BP 140/90 | HR 80 | Temp 98.1°F | Ht 65.0 in | Wt 209.0 lb

## 2014-08-22 DIAGNOSIS — L600 Ingrowing nail: Secondary | ICD-10-CM

## 2014-08-22 MED ORDER — OXYCODONE-ACETAMINOPHEN 10-325 MG PO TABS: 1.0000 | ORAL_TABLET | Freq: Three times a day (TID) | ORAL | Status: DC | PRN

## 2014-08-22 NOTE — Progress Notes (Signed)
Patient ID: Shelly Frazier, female   DOB: [birth year], 58 y.o.   MRN: [id number]  Toenail Avulsion Procedure Note  Pre-operative Diagnosis: Left Ingrown Great toenail   Post-operative Diagnosis: Left Ingrown Great toenail  Indications: Left ingrown toenail with pain and irritation.  Anesthesia: Lidocaine 1% without epinephrine without added sodium bicarbonate  Procedure Details  History of allergy to iodine: no  The risks (including bleeding and infection) and benefits of the  procedure and Written informed consent obtained.  After digital block anesthesia was obtained, a tourniquet was applied for hemostasis during the procedure.  After prepping with Betadine, using a flat probe the nail was freed from the nailbed and perionychium, removed with forceps. Phenol was applied to nailbed and perionychium. Tourniquet was removed and the surgical area was hemostatic. Antibiotic and bulky dressing was applied.   Findings: Benign lesion not sent for pathology  Complications: none.  Plan: 1. Soak the foot twice daily. Change dressing twice daily until healed over. 2. Warning signs of infection were reviewed.   3. Recommended that the patient use Percocet as needed for pain.  4. Return in 1 week.  [facility] Attending Note Nicolette Bang I  have seen and examined this patient, reviewed their chart. I have discussed this patient with the resident. I agree with the resident's findings, assessment and care plan.  Phenol was neutralized first with normal saline and then alcohol swab. Procedure was well tolerated by patient. Return precaution was given.

## 2014-08-22 NOTE — Patient Instructions (Signed)
Thank you for coming to clinic today. Today, we removed the toe nail from your left big toe. As your numbing medication wears off, it is normal to experience increased pain in the area. If you experience anything that is concerning for an infection, such as redness that is spreading, discharge, or severe pain, please call the clinic. We will see you in 1 week to recheck your wound.  Wound Care Wound care helps prevent pain and infection.  You may need a tetanus shot if:  You cannot remember when you had your last tetanus shot.  You have never had a tetanus shot.  The injury broke your skin. If you need a tetanus shot and you choose not to have one, you may get tetanus. Sickness from tetanus can be serious. HOME CARE   Only take medicine as told by your doctor.  Clean the wound daily with mild soap and water.  Change any bandages (dressings) as told by your doctor.  Put medicated cream and a bandage on the wound as told by your doctor.  Change the bandage if it gets wet, dirty, or starts to smell.  Take showers. Do not take baths, swim, or do anything that puts your wound under water.  Rest and raise (elevate) the wound until the pain and puffiness (swelling) are better.  Keep all doctor visits as told. GET HELP RIGHT AWAY IF:   Yellowish-white fluid (pus) comes from the wound.  Medicine does not lessen your pain.  There is a red streak going away from the wound.  You have a fever. MAKE SURE YOU:   Understand these instructions.  Will watch your condition.  Will get help right away if you are not doing well or get worse. Document Released: 09/22/2008 Document Revised: 03/07/2012 Document Reviewed: 04/19/2011 [facility] Patient Information 2015 [place], [place]. This information is not intended to replace advice given to you by your health care provider. Make sure you discuss any questions you have with your health care provider.

## 2014-08-31 ENCOUNTER — Encounter: Payer: Self-pay | Admitting: Family Medicine

## 2014-08-31 ENCOUNTER — Ambulatory Visit (INDEPENDENT_AMBULATORY_CARE_PROVIDER_SITE_OTHER): Payer: No Typology Code available for payment source | Admitting: Family Medicine

## 2014-08-31 ENCOUNTER — Ambulatory Visit (HOSPITAL_COMMUNITY)
Admission: RE | Admit: 2014-08-31 | Discharge: 2014-08-31 | Disposition: A | Discharge status: Home / Self Care | Payer: No Typology Code available for payment source | Source: Ambulatory Visit | Attending: Family Medicine | Admitting: Family Medicine

## 2014-08-31 ENCOUNTER — Other Ambulatory Visit: Payer: Self-pay | Admitting: *Deleted

## 2014-08-31 VITALS — BP 120/74 | HR 80 | Ht 65.0 in | Wt 209.0 lb

## 2014-08-31 DIAGNOSIS — L600 Ingrowing nail: Secondary | ICD-10-CM

## 2014-08-31 DIAGNOSIS — Z23 Encounter for immunization: Secondary | ICD-10-CM

## 2014-08-31 DIAGNOSIS — R0789 Other chest pain: Secondary | ICD-10-CM | POA: Insufficient documentation

## 2014-08-31 DIAGNOSIS — N393 Stress incontinence (female) (male): Secondary | ICD-10-CM

## 2014-08-31 DIAGNOSIS — R079 Chest pain, unspecified: Secondary | ICD-10-CM | POA: Insufficient documentation

## 2014-08-31 HISTORY — DX: Stress incontinence (female) (male): N39.3

## 2014-08-31 LAB — POCT URINALYSIS DIPSTICK
Bilirubin, UA: NEGATIVE
Glucose, UA: NEGATIVE
Ketones, UA: NEGATIVE
LEUKOCYTES UA: NEGATIVE
NITRITE UA: NEGATIVE
PH UA: 7
Protein, UA: NEGATIVE
Spec Grav, UA: 1.015
Urobilinogen, UA: 0.2

## 2014-08-31 LAB — POCT UA - MICROSCOPIC ONLY

## 2014-08-31 MED ORDER — ALBUTEROL SULFATE HFA 108 (90 BASE) MCG/ACT IN AERS: 2.0000 | INHALATION_SPRAY | Freq: Four times a day (QID) | RESPIRATORY_TRACT | Status: DC | PRN

## 2014-08-31 NOTE — Progress Notes (Signed)
Subjective:     Patient ID: Shelly Frazier, female   DOB: August 27, 1956, 58 y.o.   MRN: [id number]  HPI Ingrown nail:Here for follow up after toe nail removal. Her nail bed has brown discoloration, mildly painful, no swelling, no fever. She try to keep it clean as much as possible. Chest:C/O chest pain which she had 3 days ago, this started few days earlier on and off but currently asymptomatic. Pain is centrally located with no radiation. Denies diaphoresis, no palpitations, no SOB. Incontinence: C/O loss of urine with cough for the last 6 months gradually worsening, denies dysuria,no change in urine color.She has had 3 vaginal delivery, she had tubal ligation and hysterectomy. Smokes rarely.  Current Outpatient Prescriptions on File Prior to Visit  Medication Sig Dispense Refill  . aspirin 81 MG tablet Take 81 mg by mouth daily.      . carvedilol (COREG CR) 10 MG 24 hr capsule Take 1 capsule (10 mg total) by mouth daily.  90 capsule  1  . esomeprazole (NEXIUM) 20 MG capsule Take 1 capsule (20 mg total) by mouth daily before breakfast.  90 capsule  1  . hydrochlorothiazide (HYDRODIURIL) 25 MG tablet TAKE 1 TABLET EVERY DAY  90 tablet  3  . ibuprofen (ADVIL,MOTRIN) 200 MG tablet Take 200 mg by mouth every 6 (six) hours as needed for mild pain.      . nitroGLYCERIN (NITROSTAT) 0.4 MG SL tablet Place 1 tablet (0.4 mg total) under the tongue every 5 (five) minutes as needed for chest pain. Do not exceed 3 doses.  12 tablet  1  . tiotropium (SPIRIVA) 18 MCG inhalation capsule Place 1 capsule (18 mcg total) into inhaler and inhale daily.  90 capsule  4  . venlafaxine XR (EFFEXOR-XR) 75 MG 24 hr capsule Take 1 capsule (75 mg total) by mouth daily.  90 capsule  2  . dicyclomine (BENTYL) 20 MG tablet TAKE ONE TABLET BY MOUTH TWICE DAILY  60 tablet  3  . oxyCODONE-acetaminophen (PERCOCET) 10-325 MG per tablet Take 1 tablet by mouth every 8 (eight) hours as needed for pain.  15 tablet  0  . rosuvastatin  (CRESTOR) 5 MG tablet Take 1 tablet (5 mg total) by mouth daily.  90 tablet  2   No current facility-administered medications on file prior to visit.   Past Medical History  Diagnosis Date  . Hypertension   . Bronchitis   . Anxiety   . Elevated fasting glucose   . GERD (gastroesophageal reflux disease)   . Chest pain   . Obesity   . Tobacco abuse   . Hyperlipemia       Review of Systems  Respiratory: Negative.   Cardiovascular: Negative.   Gastrointestinal: Negative.   Genitourinary: Negative.   Skin:       Toe nail pain       Filed Vitals:   08/31/14 1519  BP: 120/74  Pulse: 80  Height:  (1.651 m)  Weight: 209 lb (94.802 kg)    Objective:   Physical Exam  Nursing note and vitals reviewed. Constitutional: She appears well-developed. No distress.  Cardiovascular: Normal rate, regular rhythm, normal heart sounds and intact distal pulses.   No murmur heard. Pulmonary/Chest: Effort normal and breath sounds normal. No respiratory distress. She has no wheezes.  Abdominal: Soft. Bowel sounds are normal. She exhibits no distension and no mass. There is no tenderness.  Musculoskeletal: Normal range of motion. She exhibits no edema.  Feet:       Assessment:     Ingrown toenail Chest pain Incontinence.     Plan:     Check problem list.

## 2014-08-31 NOTE — Assessment & Plan Note (Signed)
Atypical. EKG done today was normal with no signs of ischemia. Continue Statin and ASA. Continue Nexium Return precaution discussed.

## 2014-08-31 NOTE — Assessment & Plan Note (Signed)
S/P toenail removal of left big toe. Here for wound check. Seem to be healing well with no sign of infection. Keep toe clean and dry. F/U in 2 wks for reassessment.

## 2014-08-31 NOTE — Patient Instructions (Signed)
It was nice seeing you today. You toe nail seem to be healing well, please avoid irritation on your toe for proper healing. Your EKG also looks ok, glad you don't have chest pain now but if your chest pain return please go to the hospital. I will see you back in 2-4 wks or sooner if having any concern with your toe nail. Your urinary incontinence is likely due to weakness of your bladder and pelvic muscle, let us try kegel exercise for now, I will reassess you in 4 wks.Kegel Exercises The goal of Kegel exercises is to isolate and exercise your pelvic floor muscles. These muscles act as a hammock that supports the rectum, vagina, small intestine, and uterus. As the muscles weaken, the hammock sags and these organs are displaced from their normal positions. Kegel exercises can strengthen your pelvic floor muscles and help you to improve bladder and bowel control, improve sexual response, and help reduce many problems and some discomfort during pregnancy. Kegel exercises can be done anywhere and at any time. HOW TO PERFORM KEGEL EXERCISES 1. Locate your pelvic floor muscles. To do this, squeeze (contract) the muscles that you use when you try to stop the flow of urine. You will feel a tightness in the vaginal area (women) and a tight lift in the rectal area (men and women). 2. When you begin, contract your pelvic muscles tight for 2-5 seconds, then relax them for 2-5 seconds. This is one set. Do 4-5 sets with a short pause in between. 3. Contract your pelvic muscles for 8-10 seconds, then relax them for 8-10 seconds. Do 4-5 sets. If you cannot contract your pelvic muscles for 8-10 seconds, try 5-7 seconds and work your way up to 8-10 seconds. Your goal is 4-5 sets of 10 contractions each day. Keep your stomach, buttocks, and legs relaxed during the exercises. Perform sets of both short and long contractions. Vary your positions. Perform these contractions 3-4 times per day. Perform sets while you are:   Lying  in bed in the morning.  Standing at lunch.  Sitting in the late afternoon.  Lying in bed at night. You should do 40-50 contractions per day. Do not perform more Kegel exercises per day than recommended. Overexercising can cause muscle fatigue. Continue these exercises for for at least 15-20 weeks or as directed by your caregiver. Document Released: 11/30/2012 Document Reviewed: 11/30/2012 [facility] Patient Information 2015 [place], [place]. This information is not intended to replace advice given to you by your health care provider. Make sure you discuss any questions you have with your health care provider.

## 2014-08-31 NOTE — Assessment & Plan Note (Signed)
Kegel exercise discussed. Urinalysis obtained, and was neg for nitrite and leukocyte. Consider further work up if no improvement with Kegel exercise. Patient agreed with plan.

## 2014-09-18 ENCOUNTER — Ambulatory Visit (INDEPENDENT_AMBULATORY_CARE_PROVIDER_SITE_OTHER): Payer: No Typology Code available for payment source | Admitting: Family Medicine

## 2014-09-18 ENCOUNTER — Encounter: Payer: Self-pay | Admitting: Family Medicine

## 2014-09-18 VITALS — BP 115/74 | HR 79 | Temp 98.4°F | Wt 211.0 lb

## 2014-09-18 DIAGNOSIS — H5710 Ocular pain, unspecified eye: Secondary | ICD-10-CM

## 2014-09-18 DIAGNOSIS — R61 Generalized hyperhidrosis: Secondary | ICD-10-CM

## 2014-09-18 DIAGNOSIS — H5712 Ocular pain, left eye: Secondary | ICD-10-CM

## 2014-09-18 DIAGNOSIS — M79675 Pain in left toe(s): Secondary | ICD-10-CM | POA: Insufficient documentation

## 2014-09-18 DIAGNOSIS — M79609 Pain in unspecified limb: Secondary | ICD-10-CM

## 2014-09-18 LAB — POCT SEDIMENTATION RATE: POCT SED RATE: 51 mm/hr — AB (ref 0–22)

## 2014-09-18 MED ORDER — SULFAMETHOXAZOLE-TMP DS 800-160 MG PO TABS: 1.0000 | ORAL_TABLET | Freq: Two times a day (BID) | ORAL | Status: DC

## 2014-09-18 MED ORDER — PREDNISONE 20 MG PO TABS: 20.0000 mg | ORAL_TABLET | Freq: Every day | ORAL | Status: DC

## 2014-09-18 NOTE — Assessment & Plan Note (Signed)
May be medication induced since she is on antidepressant which can cause excessive sweating vs hyperthyroidism. TSH checked. Good hygiene recommended pending test result.

## 2014-09-18 NOTE — Patient Instructions (Addendum)
It was nice seeing you, I am sorry you are having pain in your eyes, I am concern for optic neuritis hence will like to get an MRI of your orbit. I will also have you on oral steroid for few days. For your toe nail, it seem you have some infection around your nail bed, hence will start antibiotic. I will like to see you back in 2 wks.

## 2014-09-18 NOTE — Assessment & Plan Note (Addendum)
Stress vs ocular migraine vs optic neuritis. Index of suspicion for ON is low but concerning. I started her on oral steroid. MRI orbit ordered. Return precaution given. Advised to go to the ED if symptoms worsens.  NB: I called patient still doing well. I raised concern about getting MRI done next week rather than sooner. I again stressed the need for ED visit if symptom worsens, she agreed with plan.

## 2014-09-18 NOTE — Progress Notes (Signed)
Subjective:     Patient ID: Shelly Frazier, female   DOB: [birth year], 58 y.o.   MRN: [id number]  HPI Toe nail:Here to follow up with her left big toe s/p toenail removal, still having pain from toe and now with pus discharge, she denies any recent trauma or injury to her toe, she had nail removal done 4 wks ago. Sweating: Here to follow up with excessive sweating, this occurs even at rest, at times feels wet and sweaty even in cool temp. She is concern due to FM of thyroid disease. Eye pain: Left eye pain started 1 wks ago, feels like a sharp shooting pain at the back of her left eye,pain occurs 2-3 times daily less than few seconds. 2 days ago it started happening more frequently and lasting longer. No vision changes, she saw eye doctor at [facility] 2 months ago, she got eye glasses just for reading then.No head trauma, no N/V, she does have mild headache. She stated she is always stressed.  Current Outpatient Prescriptions on File Prior to Visit  Medication Sig Dispense Refill  . albuterol (PROVENTIL HFA;VENTOLIN HFA) 108 (90 BASE) MCG/ACT inhaler Inhale 2 puffs into the lungs every 6 (six) hours as needed for wheezing.  1 Inhaler  3  . aspirin 81 MG tablet Take 81 mg by mouth daily.      . carvedilol (COREG CR) 10 MG 24 hr capsule Take 1 capsule (10 mg total) by mouth daily.  90 capsule  1  . dicyclomine (BENTYL) 20 MG tablet TAKE ONE TABLET BY MOUTH TWICE DAILY  60 tablet  3  . esomeprazole (NEXIUM) 20 MG capsule Take 1 capsule (20 mg total) by mouth daily before breakfast.  90 capsule  1  . hydrochlorothiazide (HYDRODIURIL) 25 MG tablet TAKE 1 TABLET EVERY DAY  90 tablet  3  . ibuprofen (ADVIL,MOTRIN) 200 MG tablet Take 200 mg by mouth every 6 (six) hours as needed for mild pain.      . nitroGLYCERIN (NITROSTAT) 0.4 MG SL tablet Place 1 tablet (0.4 mg total) under the tongue every 5 (five) minutes as needed for chest pain. Do not exceed 3 doses.  12 tablet  1  . oxyCODONE-acetaminophen  (PERCOCET) 10-325 MG per tablet Take 1 tablet by mouth every 8 (eight) hours as needed for pain.  15 tablet  0  . rosuvastatin (CRESTOR) 5 MG tablet Take 1 tablet (5 mg total) by mouth daily.  90 tablet  2  . tiotropium (SPIRIVA) 18 MCG inhalation capsule Place 1 capsule (18 mcg total) into inhaler and inhale daily.  90 capsule  4  . venlafaxine XR (EFFEXOR-XR) 75 MG 24 hr capsule Take 1 capsule (75 mg total) by mouth daily.  90 capsule  2   No current facility-administered medications on file prior to visit.   Past Medical History  Diagnosis Date  . Hypertension   . Bronchitis   . Anxiety   . Elevated fasting glucose   . GERD (gastroesophageal reflux disease)   . Chest pain   . Obesity   . Tobacco abuse   . Hyperlipemia       Review of Systems  Constitutional: Negative for fever.  Eyes: Positive for pain. Negative for discharge, redness and visual disturbance.  Respiratory: Negative.   Cardiovascular: Negative.   Gastrointestinal: Negative.   Skin:       Nail problem  All other systems reviewed and are negative.  Filed Vitals:   09/18/14 1116  BP:  115/74  Pulse: 79  Temp: 98.4 F (36.9 C)  TempSrc: Oral  Weight: 211 lb (95.709 kg)       Objective:   Physical Exam  Nursing note and vitals reviewed. Constitutional: She appears well-developed. No distress.  Eyes: Conjunctivae and EOM are normal. Pupils are equal, round, and reactive to light. Right eye exhibits no discharge and no exudate. Left eye exhibits no discharge and no exudate. No scleral icterus.  Fundoscopic exam:      The right eye shows no arteriolar narrowing, no AV nicking and no papilledema.       The left eye shows no arteriolar narrowing, no AV nicking and no papilledema.  VA right 20/50, left 20/30 Without corrective lenses.  Cardiovascular: Normal rate, regular rhythm, normal heart sounds and intact distal pulses.   No murmur heard. Pulmonary/Chest: Effort normal and breath sounds normal. No  respiratory distress. She has no wheezes.  Abdominal: Soft. Bowel sounds are normal. She exhibits no distension and no mass. There is no tenderness. There is no rebound.  Musculoskeletal:       Feet:       Assessment:     Toe pain Excessive sweating Left eye pain     Plan:     Check problem list.

## 2014-09-18 NOTE — Assessment & Plan Note (Signed)
Left first toe. S/P nail removal 4 wks ago. Seem to now have infection. Oral A/B. Bactrim prescribed. Recommended f/u in 2 wks for reassessment or sooner if this is getting worse.

## 2014-09-19 LAB — ANA: Anti Nuclear Antibody(ANA): NEGATIVE

## 2014-09-19 LAB — TSH: TSH: 4.442 u[IU]/mL (ref 0.350–4.500)

## 2014-09-27 ENCOUNTER — Ambulatory Visit (HOSPITAL_COMMUNITY)
Admission: RE | Admit: 2014-09-27 | Discharge: 2014-09-27 | Disposition: A | Discharge status: Home / Self Care | Payer: No Typology Code available for payment source | Source: Ambulatory Visit | Attending: Family Medicine | Admitting: Family Medicine

## 2014-09-27 ENCOUNTER — Telehealth: Payer: Self-pay | Admitting: *Deleted

## 2014-09-27 ENCOUNTER — Other Ambulatory Visit: Payer: Self-pay | Admitting: Family Medicine

## 2014-09-27 DIAGNOSIS — H539 Unspecified visual disturbance: Secondary | ICD-10-CM | POA: Insufficient documentation

## 2014-09-27 DIAGNOSIS — H5712 Ocular pain, left eye: Secondary | ICD-10-CM

## 2014-09-27 LAB — POCT I-STAT CREATININE: Creatinine, Ser: 1 mg/dL (ref 0.50–1.10)

## 2014-09-27 MED ORDER — GADOBENATE DIMEGLUMINE 529 MG/ML IV SOLN
20.0000 mL | Freq: Once | INTRAVENOUS | Status: AC | PRN
  Administered 2014-09-27: 20 mL via INTRAVENOUS

## 2014-09-27 NOTE — Telephone Encounter (Signed)
Pt is returning someone's call, says she missed it while having her MRI done. Knox RoyaltyErin Odell

## 2014-09-28 ENCOUNTER — Telehealth: Payer: Self-pay | Admitting: Family Medicine

## 2014-09-28 ENCOUNTER — Encounter: Payer: Self-pay | Admitting: Family Medicine

## 2014-09-28 NOTE — Telephone Encounter (Signed)
Discussed negative MRI with patient.

## 2014-09-28 NOTE — Telephone Encounter (Signed)
Informed patient that I was calling to get her MRI scheduled and didn't realize she already had it done. Jazmin Hartsell,CMA

## 2014-10-05 ENCOUNTER — Other Ambulatory Visit: Payer: Self-pay | Admitting: *Deleted

## 2014-10-08 MED ORDER — VENLAFAXINE HCL ER 75 MG PO CP24: 75.0000 mg | ORAL_CAPSULE | Freq: Every day | ORAL | Status: DC

## 2014-10-09 ENCOUNTER — Other Ambulatory Visit: Payer: Self-pay | Admitting: Family Medicine

## 2014-10-09 MED ORDER — VENLAFAXINE HCL ER 75 MG PO CP24: 75.0000 mg | ORAL_CAPSULE | Freq: Every day | ORAL | Status: DC

## 2014-10-09 MED ORDER — ESOMEPRAZOLE MAGNESIUM 20 MG PO CPDR: 20.0000 mg | DELAYED_RELEASE_CAPSULE | Freq: Every day | ORAL | Status: DC

## 2014-10-09 NOTE — Telephone Encounter (Signed)
Refill request for EFFEXOR-XR & Nexium. Please fax to [facility], today if possible.

## 2014-10-16 ENCOUNTER — Ambulatory Visit (INDEPENDENT_AMBULATORY_CARE_PROVIDER_SITE_OTHER): Payer: No Typology Code available for payment source | Admitting: Family Medicine

## 2014-10-16 ENCOUNTER — Encounter: Payer: Self-pay | Admitting: Family Medicine

## 2014-10-16 VITALS — BP 134/82 | HR 105 | Temp 98.0°F | Wt 215.0 lb

## 2014-10-16 DIAGNOSIS — I10 Essential (primary) hypertension: Secondary | ICD-10-CM

## 2014-10-16 DIAGNOSIS — E785 Hyperlipidemia, unspecified: Secondary | ICD-10-CM

## 2014-10-16 DIAGNOSIS — M79675 Pain in left toe(s): Secondary | ICD-10-CM

## 2014-10-16 DIAGNOSIS — H5712 Ocular pain, left eye: Secondary | ICD-10-CM

## 2014-10-16 MED ORDER — ROSUVASTATIN CALCIUM 5 MG PO TABS: 5.0000 mg | ORAL_TABLET | Freq: Every day | ORAL | Status: DC

## 2014-10-16 MED ORDER — ESOMEPRAZOLE MAGNESIUM 20 MG PO CPDR: 20.0000 mg | DELAYED_RELEASE_CAPSULE | Freq: Two times a day (BID) | ORAL | Status: DC

## 2014-10-16 NOTE — Assessment & Plan Note (Signed)
Left great toe pain and infection resolved.

## 2014-10-16 NOTE — Patient Instructions (Addendum)
It was nice seeing you today, your BP is higher than usual but repeat BP came down to normal. I will continue you on current BP regimen. You seem to be doing well in general so I will see you back in 3 months or sooner if needed.

## 2014-10-16 NOTE — Addendum Note (Signed)
Addended by: Janit PaganENIOLA, Cotina Freedman T on: 10/16/2014 01:46 PM   Modules accepted: Orders

## 2014-10-16 NOTE — Assessment & Plan Note (Signed)
MRI reviewed and discussed with patient. No sign of optic neuritis. Eye pain might be a migraine variant vs sinus problem. Tylenol recommended prn pain. Saw an ophthalmologist at [facility] about 2-3 months ago and a new glasses was given then. If symptom persist she might benefit from follow up but insurance is the problem.

## 2014-10-16 NOTE — Assessment & Plan Note (Signed)
BP was initially elevated. This improved after I rechecked. Continue current BP regimen and home monitoring.

## 2014-10-16 NOTE — Progress Notes (Signed)
Subjective:     Patient ID: Shelly Frazier, female   DOB: 08/08/1956, [age] y.o.   MRN: [id number]  HPI Left eye pain: here for follow up with her left eye pain, she stated she continues to have the pain with occasional blurry vision, at times she feels like headache behind her left eye associated with sinus pressure. She denies fever, no N/V,no vision loss. HTN/HLD:She is compliant with all medications, last dose was taken this morning. She need refill of her Crestor. Great toe: Here to f/u with left great toe infection, she has completed her Bactrim DS. Feels better with no pain or discoloration of her toe.  Current Outpatient Prescriptions on File Prior to Visit  Medication Sig Dispense Refill  . aspirin 81 MG tablet Take 81 mg by mouth daily.      . carvedilol (COREG CR) 10 MG 24 hr capsule Take 1 capsule (10 mg total) by mouth daily.  90 capsule  1  . esomeprazole (NEXIUM) 20 MG capsule Take 1 capsule (20 mg total) by mouth daily before breakfast.  90 capsule  1  . hydrochlorothiazide (HYDRODIURIL) 25 MG tablet TAKE 1 TABLET EVERY DAY  90 tablet  3  . rosuvastatin (CRESTOR) 5 MG tablet Take 1 tablet (5 mg total) by mouth daily.  90 tablet  2  . tiotropium (SPIRIVA) 18 MCG inhalation capsule Place 1 capsule (18 mcg total) into inhaler and inhale daily.  90 capsule  4  . venlafaxine XR (EFFEXOR-XR) 75 MG 24 hr capsule Take 1 capsule (75 mg total) by mouth daily.  90 capsule  1  . albuterol (PROVENTIL HFA;VENTOLIN HFA) 108 (90 BASE) MCG/ACT inhaler Inhale 2 puffs into the lungs every 6 (six) hours as needed for wheezing.  1 Inhaler  3  . dicyclomine (BENTYL) 20 MG tablet TAKE ONE TABLET BY MOUTH TWICE DAILY  60 tablet  3  . ibuprofen (ADVIL,MOTRIN) 200 MG tablet Take 200 mg by mouth every 6 (six) hours as needed for mild pain.      . nitroGLYCERIN (NITROSTAT) 0.4 MG SL tablet Place 1 tablet (0.4 mg total) under the tongue every 5 (five) minutes as needed for chest pain. Do not exceed 3 doses.  12  tablet  1   No current facility-administered medications on file prior to visit.   Past Medical History  Diagnosis Date  . Hypertension   . Bronchitis   . Anxiety   . Elevated fasting glucose   . GERD (gastroesophageal reflux disease)   . Chest pain   . Obesity   . Tobacco abuse   . Hyperlipemia       Review of Systems  Eyes: Positive for pain. Negative for photophobia, discharge, redness and visual disturbance.  Respiratory: Negative.   Cardiovascular: Negative.   Gastrointestinal: Negative.   Musculoskeletal: Negative.   Skin: Negative.   Neurological: Negative.   All other systems reviewed and are negative.      Filed Vitals:   10/16/14 1101 10/16/14 1121  BP: 172/93 134/82  Pulse: 105 105  Temp: 98 F (36.7 C)   TempSrc: Oral   Weight: 215 lb (97.523 kg)   ; Objective:   Physical Exam  Nursing note and vitals reviewed. Constitutional: She appears well-developed. No distress.  HENT:  Head: Normocephalic.  Eyes: Conjunctivae and EOM are normal. Pupils are equal, round, and reactive to light. Right eye exhibits no discharge. Left eye exhibits no discharge. No scleral icterus.  Neck: Neck supple.  Cardiovascular: Normal rate,  regular rhythm, normal heart sounds and intact distal pulses.   No murmur heard. Pulmonary/Chest: Effort normal and breath sounds normal. No respiratory distress. She has no wheezes.  Abdominal: Soft. Bowel sounds are normal. She exhibits no distension and no mass. There is no tenderness.  Musculoskeletal: Normal range of motion. She exhibits no edema.       Feet:       Assessment:     Left eye pain HTN HLD Left great toe infection      Plan:     Check problem list.

## 2014-10-16 NOTE — Assessment & Plan Note (Signed)
Need refill of Crestor. I will print and fax script to her pharmacy.

## 2014-10-16 NOTE — Addendum Note (Signed)
Addended by: Janit PaganENIOLA, Willowdean Luhmann T on: 10/16/2014 01:48 PM   Modules accepted: Orders

## 2014-12-04 ENCOUNTER — Ambulatory Visit: Payer: Self-pay

## 2014-12-14 ENCOUNTER — Other Ambulatory Visit: Payer: Self-pay | Admitting: *Deleted

## 2014-12-14 MED ORDER — ALBUTEROL SULFATE HFA 108 (90 BASE) MCG/ACT IN AERS: 2.0000 | INHALATION_SPRAY | Freq: Four times a day (QID) | RESPIRATORY_TRACT | Status: DC | PRN

## 2015-02-05 ENCOUNTER — Other Ambulatory Visit: Payer: Self-pay | Admitting: *Deleted

## 2015-02-05 MED ORDER — CARVEDILOL PHOSPHATE ER 10 MG PO CP24: 10.0000 mg | ORAL_CAPSULE | Freq: Every day | ORAL | Status: DC

## 2015-02-19 ENCOUNTER — Encounter: Payer: Self-pay | Admitting: Family Medicine

## 2015-02-19 ENCOUNTER — Ambulatory Visit (INDEPENDENT_AMBULATORY_CARE_PROVIDER_SITE_OTHER): Payer: Self-pay | Admitting: Family Medicine

## 2015-02-19 ENCOUNTER — Telehealth: Payer: Self-pay | Admitting: Family Medicine

## 2015-02-19 VITALS — BP 138/82 | HR 86 | Temp 98.3°F | Wt 215.0 lb

## 2015-02-19 DIAGNOSIS — F17200 Nicotine dependence, unspecified, uncomplicated: Secondary | ICD-10-CM

## 2015-02-19 DIAGNOSIS — L918 Other hypertrophic disorders of the skin: Secondary | ICD-10-CM

## 2015-02-19 DIAGNOSIS — J069 Acute upper respiratory infection, unspecified: Secondary | ICD-10-CM

## 2015-02-19 DIAGNOSIS — Z720 Tobacco use: Secondary | ICD-10-CM

## 2015-02-19 MED ORDER — BENZONATATE 100 MG PO CAPS: 100.0000 mg | ORAL_CAPSULE | Freq: Two times a day (BID) | ORAL | Status: DC | PRN

## 2015-02-19 MED ORDER — GUAIFENESIN-CODEINE 100-10 MG/5ML PO SYRP: 5.0000 mL | ORAL_SOLUTION | Freq: Three times a day (TID) | ORAL | Status: DC | PRN

## 2015-02-19 NOTE — Assessment & Plan Note (Signed)
Multiple on neck, back and chest. No sign of inflammation or infection. Cosmetic removal if interested. She is advised to schedule f/u for removal of some of the lesions.

## 2015-02-19 NOTE — Telephone Encounter (Signed)
I spoke with patient she stated she does fine with Tussinex with no allergy, she will come to the clinic tomorrow to pick up script.

## 2015-02-19 NOTE — Assessment & Plan Note (Signed)
Counseling done on smoking cessation. She continues to work hard on quitting, I commended her on this.

## 2015-02-19 NOTE — Patient Instructions (Signed)
  It was nice seeing you today, I am sorry you caught a cough, your lung test sounds good, please try Cheratussin prn cough. If no improvement call back soon.  Upper Respiratory Infection, Adult An upper respiratory infection (URI) is also known as the common cold. It is often caused by a type of germ (virus). Colds are easily spread (contagious). You can pass it to others by kissing, coughing, sneezing, or drinking out of the same glass. Usually, you get better in 1 or 2 weeks.  HOME CARE   Only take medicine as told by your doctor.  Use a warm mist humidifier or breathe in steam from a hot shower.  Drink enough water and fluids to keep your pee (urine) clear or pale yellow.  Get plenty of rest.  Return to work when your temperature is back to normal or as told by your doctor. You may use a face mask and wash your hands to stop your cold from spreading. GET HELP RIGHT AWAY IF:   After the first few days, you feel you are getting worse.  You have questions about your medicine.  You have chills, shortness of breath, or brown or red spit (mucus).  You have yellow or brown snot (nasal discharge) or pain in the face, especially when you bend forward.  You have a fever, puffy (swollen) neck, pain when you swallow, or white spots in the back of your throat.  You have a bad headache, ear pain, sinus pain, or chest pain.  You have a high-pitched whistling sound when you breathe in and out (wheezing).  You have a lasting cough or cough up blood.  You have sore muscles or a stiff neck. MAKE SURE YOU:   Understand these instructions.  Will watch your condition.  Will get help right away if you are not doing well or get worse. Document Released: 06/01/2008 Document Revised: 03/07/2012 Document Reviewed: 03/21/2014 [facility] Patient Information 2015 [place], [place]. This information is not intended to replace advice given to you by your health care provider. Make sure you discuss any  questions you have with your health care provider.

## 2015-02-19 NOTE — Telephone Encounter (Signed)
Will forward to Md. Osias Resnick,CMA  

## 2015-02-19 NOTE — Telephone Encounter (Signed)
Please let patient know that Tussionex also has codeine in it since she is allergic to codeine that is why this was not prescribed.

## 2015-02-19 NOTE — Assessment & Plan Note (Signed)
Likely viral. Respiratory exam completely benign. I recommended tessalon prn cough. If no improvement in few days she is advised to call. I will consider A/B treatment if no improvement due to hx of COPD. F/U in 1-2 wks prn.

## 2015-02-19 NOTE — Progress Notes (Signed)
Subjective:     Patient ID: Shelly Frazier, female   DOB: 09/13/1956, 59 y.o.   MRN: [id number]  Cough This is a new problem. The current episode started in the past 7 days (Started coughing 5 days ago). The problem has been gradually worsening. The problem occurs constantly. The cough is non-productive. Associated symptoms include shortness of breath and wheezing. Pertinent negatives include no chest pain or fever. The symptoms are aggravated by cold air and lying down. Risk factors for lung disease include smoking/tobacco exposure (exposure to sister who was also coughing.). Treatments tried: cough drops. The treatment provided no relief. Her past medical history is significant for COPD.   Smoking: smoke 1 stick on and off. She is working hard to quit. Skin lesion:C/O skin lesion around her neck and back, she noticed a new one about 1 month ago, no pain, itches at times, not getting bigger.  Current Outpatient Prescriptions on File Prior to Visit  Medication Sig Dispense Refill  . albuterol (PROVENTIL HFA;VENTOLIN HFA) 108 (90 BASE) MCG/ACT inhaler Inhale 2 puffs into the lungs every 6 (six) hours as needed for wheezing. 1 Inhaler 3  . aspirin 81 MG tablet Take 81 mg by mouth daily.    . carvedilol (COREG CR) 10 MG 24 hr capsule Take 1 capsule (10 mg total) by mouth daily. 90 capsule 1  . dicyclomine (BENTYL) 20 MG tablet TAKE ONE TABLET BY MOUTH TWICE DAILY 60 tablet 3  . esomeprazole (NEXIUM) 20 MG capsule Take 1 capsule (20 mg total) by mouth 2 (two) times daily before a meal. 180 capsule 1  . hydrochlorothiazide (HYDRODIURIL) 25 MG tablet TAKE 1 TABLET EVERY DAY 90 tablet 3  . rosuvastatin (CRESTOR) 5 MG tablet Take 1 tablet (5 mg total) by mouth daily. 90 tablet 1  . tiotropium (SPIRIVA) 18 MCG inhalation capsule Place 1 capsule (18 mcg total) into inhaler and inhale daily. 90 capsule 4  . venlafaxine XR (EFFEXOR-XR) 75 MG 24 hr capsule Take 1 capsule (75 mg total) by mouth daily. 90 capsule 1   . ibuprofen (ADVIL,MOTRIN) 200 MG tablet Take 200 mg by mouth every 6 (six) hours as needed for mild pain.    . nitroGLYCERIN (NITROSTAT) 0.4 MG SL tablet Place 1 tablet (0.4 mg total) under the tongue every 5 (five) minutes as needed for chest pain. Do not exceed 3 doses. (Patient not taking: Reported on 02/19/2015) 12 tablet 1   No current facility-administered medications on file prior to visit.   Past Medical History  Diagnosis Date  . Hypertension   . Bronchitis   . Anxiety   . Elevated fasting glucose   . GERD (gastroesophageal reflux disease)   . Chest pain   . Obesity   . Tobacco abuse   . Hyperlipemia       Review of Systems  Constitutional: Negative for fever.  Respiratory: Positive for cough, shortness of breath and wheezing.   Cardiovascular: Negative for chest pain.  Skin:       Skin tag  All other systems reviewed and are negative.  Filed Vitals:   02/19/15 0932  BP: 138/82  Pulse: 86  Temp: 98.3 F (36.8 C)  TempSrc: Oral  Weight: 215 lb (97.523 kg)  SpO2: 93%       Objective:   Physical Exam  Constitutional: She appears well-developed. No distress.  Cardiovascular: Normal rate, regular rhythm, normal heart sounds and intact distal pulses.   No murmur heard. Pulmonary/Chest: Effort normal and breath sounds  normal. No respiratory distress. She has no wheezes. She has no rales. She exhibits no tenderness.  Abdominal: Soft. Bowel sounds are normal. She exhibits no distension. There is no tenderness. There is no guarding.  Skin: Skin is warm. No rash noted.     Nursing note and vitals reviewed.      Assessment:     URI Smoking Skin tag     Plan:     Check problem list.

## 2015-02-19 NOTE — Telephone Encounter (Signed)
Pt called and said that the only medication that helps her cough is Tussionex. Can we call that in.jw

## 2015-02-20 MED ORDER — HYDROCOD POLST-CHLORPHEN POLST 10-8 MG/5ML PO LQCR: 5.0000 mL | Freq: Two times a day (BID) | ORAL | Status: DC | PRN

## 2015-02-20 NOTE — Addendum Note (Signed)
Addended by: Janit PaganENIOLA, Meilani Edmundson T on: 02/20/2015 07:50 AM   Modules accepted: Orders

## 2015-03-04 ENCOUNTER — Telehealth: Payer: Self-pay | Admitting: Family Medicine

## 2015-03-04 NOTE — Telephone Encounter (Signed)
Letter from 10/2013 copied and today's date placed on it. Aubrianna Orchard,CMA

## 2015-03-04 NOTE — Telephone Encounter (Signed)
Pt called and will come by in about 2 hours to pick up the new letter for her to have a pet. jw

## 2015-03-14 ENCOUNTER — Other Ambulatory Visit: Payer: Self-pay | Admitting: Family Medicine

## 2015-03-21 ENCOUNTER — Other Ambulatory Visit: Payer: Self-pay | Admitting: Family Medicine

## 2015-03-21 MED ORDER — DEXLANSOPRAZOLE 30 MG PO CPDR: 30.0000 mg | DELAYED_RELEASE_CAPSULE | Freq: Every day | ORAL | Status: DC

## 2015-03-21 NOTE — Telephone Encounter (Signed)
I got a fax from the pharmacy stating that Nexium will no longer be given to patient for free, hence they recommended switching her medication to Dexilant. I agree with recommendation and will switch medication.

## 2015-05-02 ENCOUNTER — Other Ambulatory Visit: Payer: Self-pay | Admitting: *Deleted

## 2015-05-02 MED ORDER — ALBUTEROL SULFATE HFA 108 (90 BASE) MCG/ACT IN AERS
2.0000 | INHALATION_SPRAY | Freq: Four times a day (QID) | RESPIRATORY_TRACT | Status: DC | PRN
Start: 2015-05-02 — End: 2015-05-31

## 2015-05-15 ENCOUNTER — Other Ambulatory Visit: Payer: Self-pay | Admitting: *Deleted

## 2015-05-15 MED ORDER — ROSUVASTATIN CALCIUM 5 MG PO TABS: 5.0000 mg | ORAL_TABLET | Freq: Every day | ORAL | Status: DC

## 2015-05-20 ENCOUNTER — Telehealth: Payer: Self-pay | Admitting: Family Medicine

## 2015-05-20 ENCOUNTER — Other Ambulatory Visit: Payer: Self-pay | Admitting: Family Medicine

## 2015-05-20 MED ORDER — DICYCLOMINE HCL 20 MG PO TABS: 20.0000 mg | ORAL_TABLET | Freq: Two times a day (BID) | ORAL | Status: DC

## 2015-05-20 NOTE — Telephone Encounter (Signed)
Done. Please inform patient

## 2015-05-20 NOTE — Telephone Encounter (Signed)
Please inform patient Bentyl has been refilled to [facility].

## 2015-05-20 NOTE — Telephone Encounter (Signed)
Requesting refill on bentyl, Pt goes to [facility]

## 2015-05-20 NOTE — Telephone Encounter (Signed)
Refill request from pt. Will forward to PCP for review. Ole Lafon, CMA. 

## 2015-05-20 NOTE — Telephone Encounter (Signed)
Pt advised. Arshia Spellman, CMA. 

## 2015-05-31 ENCOUNTER — Other Ambulatory Visit: Payer: Self-pay | Admitting: *Deleted

## 2015-05-31 MED ORDER — ALBUTEROL SULFATE HFA 108 (90 BASE) MCG/ACT IN AERS: 2.0000 | INHALATION_SPRAY | Freq: Four times a day (QID) | RESPIRATORY_TRACT | Status: DC | PRN

## 2015-06-04 ENCOUNTER — Other Ambulatory Visit: Payer: Self-pay | Admitting: *Deleted

## 2015-06-04 MED ORDER — VENLAFAXINE HCL ER 75 MG PO CP24: 75.0000 mg | ORAL_CAPSULE | Freq: Every day | ORAL | Status: DC

## 2015-07-24 ENCOUNTER — Other Ambulatory Visit: Payer: Self-pay | Admitting: *Deleted

## 2015-07-24 MED ORDER — TIOTROPIUM BROMIDE MONOHYDRATE 18 MCG IN CAPS: 18.0000 ug | ORAL_CAPSULE | Freq: Every day | RESPIRATORY_TRACT | Status: DC

## 2015-08-06 ENCOUNTER — Other Ambulatory Visit: Payer: Self-pay | Admitting: *Deleted

## 2015-08-06 MED ORDER — CARVEDILOL PHOSPHATE ER 10 MG PO CP24: 10.0000 mg | ORAL_CAPSULE | Freq: Every day | ORAL | Status: DC

## 2015-08-16 ENCOUNTER — Other Ambulatory Visit: Payer: Self-pay | Admitting: Family Medicine

## 2015-08-16 NOTE — Telephone Encounter (Signed)
Pt called because she needs a refill on her Bentyl called in. jw

## 2015-08-17 MED ORDER — DICYCLOMINE HCL 20 MG PO TABS: 20.0000 mg | ORAL_TABLET | Freq: Two times a day (BID) | ORAL | Status: DC

## 2015-08-20 ENCOUNTER — Ambulatory Visit (INDEPENDENT_AMBULATORY_CARE_PROVIDER_SITE_OTHER): Payer: Self-pay | Admitting: Family Medicine

## 2015-08-20 ENCOUNTER — Encounter: Payer: Self-pay | Admitting: Family Medicine

## 2015-08-20 VITALS — BP 138/86 | HR 94 | Temp 98.8°F | Ht 65.0 in | Wt 190.6 lb

## 2015-08-20 DIAGNOSIS — I10 Essential (primary) hypertension: Secondary | ICD-10-CM

## 2015-08-20 DIAGNOSIS — F508 Other eating disorders: Secondary | ICD-10-CM

## 2015-08-20 DIAGNOSIS — F5089 Other specified eating disorder: Secondary | ICD-10-CM

## 2015-08-20 DIAGNOSIS — R413 Other amnesia: Secondary | ICD-10-CM

## 2015-08-20 DIAGNOSIS — M542 Cervicalgia: Secondary | ICD-10-CM | POA: Insufficient documentation

## 2015-08-20 DIAGNOSIS — R51 Headache: Secondary | ICD-10-CM

## 2015-08-20 DIAGNOSIS — M25512 Pain in left shoulder: Secondary | ICD-10-CM

## 2015-08-20 DIAGNOSIS — M25519 Pain in unspecified shoulder: Secondary | ICD-10-CM

## 2015-08-20 DIAGNOSIS — M25511 Pain in right shoulder: Secondary | ICD-10-CM

## 2015-08-20 DIAGNOSIS — R634 Abnormal weight loss: Secondary | ICD-10-CM

## 2015-08-20 DIAGNOSIS — R519 Headache, unspecified: Secondary | ICD-10-CM

## 2015-08-20 NOTE — Assessment & Plan Note (Signed)
Etiology unclear, OA vs muscle strain. Tylenol prn pain and home ROM exercise. If no improvement will consider imaging.

## 2015-08-20 NOTE — Assessment & Plan Note (Signed)
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 Will need dementia work up. Return for full memory loss assessment in 2 week. Lab not done today due to the expiration of her orange card. She will meet with Britta Mccreedy to reinstate this.

## 2015-08-20 NOTE — Assessment & Plan Note (Signed)
Currently asymptomatic. She stated she has the headache more with her neck pain. No neurologic deficit or signs of meningeal irritation. Due to chronicity and the fact that she wakes up with the headache I recommended imaging which she will also need for memory loss. She however does not currently have insurance. I recommended she goes to the ED if symptoms persist or worsens. She agreed with plan.

## 2015-08-20 NOTE — Assessment & Plan Note (Signed)
Her BP was initially slightly elevated but improved after recheck. No changes made to her meds.

## 2015-08-20 NOTE — Assessment & Plan Note (Signed)
Return for Hgb check once she gets her orange card. Last Hgb was normal. Start MVI for now.

## 2015-08-20 NOTE — Progress Notes (Signed)
Subjective:     Patient ID: Shelly Frazier, female   DOB: 01/22/1956, 59 y.o.   MRN: [id number]  Memory loss:C/O memory problem for about 3-4 months, more with short term memory, gradually worsening. She forgets reason why she goes into the kitchen, she forgets every little details, she forgot to make doctors appointment. Mom sister had alzheimer. She tries to keep her brain active by reading. No recent head injury.  Shoulder Pain  The pain is present in the left shoulder and right shoulder (More on the right, she had hx of trauma to shoulder and clavicular fracture in 2007 which contributed to her right shoulder pain.). This is a new problem. The pain is at a severity of 4/10. The pain is mild. Pertinent negatives include no fever, numbness or stiffness. The symptoms are aggravated by activity. She has tried NSAIDS for the symptoms. The treatment provided mild relief. Family history does not include gout or rheumatoid arthritis.  Neck Pain  This is a new problem. The current episode started more than 1 year ago. The problem occurs intermittently. The problem has been waxing and waning. The pain is associated with nothing. The quality of the pain is described as aching. The pain is at a severity of 4/10. Nothing aggravates the symptoms. Associated symptoms include headaches and weight loss. Pertinent negatives include no fever, numbness, photophobia, visual change or weakness. She has tried NSAIDs for the symptoms.  Headache  This is a chronic problem. The current episode started more than 1 month ago. The problem occurs intermittently. Progression since onset: Feels like she has headache everyday when she gets up. The pain is located in the occipital region. The pain quality is not similar to prior headaches. The quality of the pain is described as throbbing. The pain is at a severity of 5/10. Associated symptoms include anorexia, a loss of balance, neck pain and weight loss. Pertinent negatives include no  blurred vision, coughing, dizziness, ear pain, eye pain, fever, numbness, photophobia, seizures, visual change or weakness. Exacerbated by: She feels it's related to her neck  pain. She has tried NSAIDs for the symptoms. The treatment provided moderate relief. Her past medical history is significant for hypertension. There is no history of cancer, migraine headaches, migraines in the family or recent head traumas.  Weight loss: Appetite low in the past few months. No blood in stool, no belly pain. Feels fatigued at times. Pica:She has been eating a lot of ice recently for no reason, she craves ice.  HTN: Here for follow up, she is compliant with her meds, no concern.  Current Outpatient Prescriptions on File Prior to Visit  Medication Sig Dispense Refill  . albuterol (PROVENTIL HFA;VENTOLIN HFA) 108 (90 BASE) MCG/ACT inhaler Inhale 2 puffs into the lungs every 6 (six) hours as needed for wheezing. 1 Inhaler 3  . carvedilol (COREG CR) 10 MG 24 hr capsule Take 1 capsule (10 mg total) by mouth daily. 90 capsule 1  . Dexlansoprazole (DEXILANT) 30 MG capsule Take 1 capsule (30 mg total) by mouth daily. 90 capsule 1  . dicyclomine (BENTYL) 20 MG tablet Take 1 tablet (20 mg total) by mouth 2 (two) times daily. 60 tablet 2  . hydrochlorothiazide (HYDRODIURIL) 25 MG tablet TAKE 1 TABLET EVERY DAY 90 tablet 2  . ibuprofen (ADVIL,MOTRIN) 200 MG tablet Take 200 mg by mouth every 6 (six) hours as needed for mild pain.    . rosuvastatin (CRESTOR) 5 MG tablet Take 1 tablet (5 mg total)  by mouth daily. 90 tablet 1  . tiotropium (SPIRIVA) 18 MCG inhalation capsule Place 1 capsule (18 mcg total) into inhaler and inhale daily. 90 capsule 2  . venlafaxine XR (EFFEXOR-XR) 75 MG 24 hr capsule Take 1 capsule (75 mg total) by mouth daily. 90 capsule 1  . aspirin 81 MG tablet Take 81 mg by mouth daily.    . nitroGLYCERIN (NITROSTAT) 0.4 MG SL tablet Place 1 tablet (0.4 mg total) under the tongue every 5 (five) minutes as  needed for chest pain. Do not exceed 3 doses. (Patient not taking: Reported on 02/19/2015) 12 tablet 1   No current facility-administered medications on file prior to visit.   Past Medical History  Diagnosis Date  . Hypertension   . Bronchitis   . Anxiety   . Elevated fasting glucose   . GERD (gastroesophageal reflux disease)   . Chest pain   . Obesity   . Tobacco abuse   . Hyperlipemia       Review of Systems  Constitutional: Positive for weight loss, appetite change and unexpected weight change. Negative for fever.  HENT: Negative for ear pain.   Eyes: Negative for blurred vision, photophobia and pain.  Respiratory: Negative for cough.   Cardiovascular: Negative.   Gastrointestinal: Positive for anorexia.  Genitourinary: Negative.   Musculoskeletal: Positive for neck pain. Negative for stiffness.  Neurological: Positive for headaches and loss of balance. Negative for dizziness, seizures, weakness and numbness.  All other systems reviewed and are negative.  Filed Vitals:   08/20/15 1058 08/20/15 1139  BP: 144/99 138/86  Pulse: 94   Temp: 98.8 F (37.1 C)   TempSrc: Oral   Height: 5\' 5"  (1.651 m)   Weight: 190 lb 9 oz (86.439 kg)        Objective:   Physical Exam  Constitutional: She is oriented to person, place, and time. She appears well-developed. No distress.  Eyes: Pupils are equal, round, and reactive to light.  Neck: Neck supple.  Cardiovascular: Normal rate, regular rhythm and normal heart sounds.   No murmur heard. Pulmonary/Chest: Effort normal and breath sounds normal. No respiratory distress. She has no wheezes. She exhibits no tenderness.  Abdominal: Soft. Bowel sounds are normal. She exhibits no distension and no mass. There is no tenderness.  Musculoskeletal: Normal range of motion. She exhibits no edema.       Right shoulder: She exhibits tenderness. She exhibits normal range of motion.       Left shoulder: Normal.       Cervical back: She  exhibits normal range of motion and no tenderness.  Neurological: She is alert and oriented to person, place, and time. She has normal reflexes. No cranial nerve deficit. She displays no Babinski's sign on the right side. She displays no Babinski's sign on the left side.  Psychiatric: She has a normal mood and affect.  Nursing note and vitals reviewed.      Assessment:     Memory loss: Shoulder Pain  Neck Pain    Headache Weight loss Pica HTN Plan:     Check problem list.

## 2015-08-20 NOTE — Assessment & Plan Note (Signed)
Exam benign today. ?? OA. Previous right shoulder xray in 2014 was normal. Order placed for xray of her shoulders, she will get it done once her orange card is reinstated.

## 2015-08-20 NOTE — Patient Instructions (Signed)

## 2015-08-20 NOTE — Assessment & Plan Note (Signed)
She lost 25 lbs over 6 months. This might be due to poor food intake ( loss of appetite). I reviewed her cancer screening record, she is up to date with colonoscopy. She is due to mammogram. She will schedule this as soon her orange card is reinstated. Lab work was recommended today to check for metabolic cause of her weight loss, but we will defer so she does not get a bill in the mail per patient's request.

## 2015-08-26 ENCOUNTER — Ambulatory Visit: Payer: Self-pay

## 2015-09-03 ENCOUNTER — Ambulatory Visit: Payer: Self-pay | Admitting: Family Medicine

## 2015-09-03 ENCOUNTER — Encounter: Payer: Self-pay | Admitting: Family Medicine

## 2015-09-27 ENCOUNTER — Emergency Department (INDEPENDENT_AMBULATORY_CARE_PROVIDER_SITE_OTHER)
Admission: EM | Admit: 2015-09-27 | Discharge: 2015-09-27 | Disposition: A | Discharge status: Home / Self Care | Payer: No Typology Code available for payment source | Source: Home / Self Care | Attending: Family Medicine | Admitting: Family Medicine

## 2015-09-27 ENCOUNTER — Encounter (HOSPITAL_COMMUNITY): Payer: Self-pay | Admitting: Emergency Medicine

## 2015-09-27 DIAGNOSIS — J449 Chronic obstructive pulmonary disease, unspecified: Secondary | ICD-10-CM

## 2015-09-27 DIAGNOSIS — J302 Other seasonal allergic rhinitis: Secondary | ICD-10-CM

## 2015-09-27 DIAGNOSIS — IMO0001 Reserved for inherently not codable concepts without codable children: Secondary | ICD-10-CM

## 2015-09-27 MED ORDER — ALBUTEROL SULFATE (2.5 MG/3ML) 0.083% IN NEBU
INHALATION_SOLUTION | RESPIRATORY_TRACT | Status: AC
  Filled 2015-09-27: qty 3

## 2015-09-27 MED ORDER — ALBUTEROL SULFATE (2.5 MG/3ML) 0.083% IN NEBU
2.5000 mg | INHALATION_SOLUTION | Freq: Once | RESPIRATORY_TRACT | Status: AC
  Administered 2015-09-27: 2.5 mg via RESPIRATORY_TRACT

## 2015-09-27 MED ORDER — IPRATROPIUM-ALBUTEROL 0.5-2.5 (3) MG/3ML IN SOLN
3.0000 mL | Freq: Once | RESPIRATORY_TRACT | Status: AC
  Administered 2015-09-27: 3 mL via RESPIRATORY_TRACT

## 2015-09-27 MED ORDER — IPRATROPIUM-ALBUTEROL 0.5-2.5 (3) MG/3ML IN SOLN
RESPIRATORY_TRACT | Status: AC
  Filled 2015-09-27: qty 3

## 2015-09-27 NOTE — ED Notes (Signed)
C/o cold sx onset 1.5 weeks Sx include prod cough, wheezing, congestion associated w/sleep disturbance due to cough Hx of COPD; smokes "occasionally" Denies fevers, chills Alert and oriented x4... No acute distress.

## 2015-09-27 NOTE — ED Provider Notes (Signed)
CSN: [id number]     Arrival date & time 09/27/15  1413 History   First MD Initiated Contact with Patient 09/27/15 1455     Chief Complaint  Patient presents with  . URI   (Consider location/radiation/quality/duration/timing/severity/associated sxs/prior Treatment) HPI Comments: 59 year old female complaining of a harsh cough and chest congestion for 1-1/2 weeks. It is associated with wheezing, PND, or clearing of the throat frequently, hoarseness and nasal congestion with rhinorrhea. She states her home temperatures have been between 99 and 100. Mild shortness of breath but did not much above baseline. Denies sore throat, chest pain. History of COPD with a 21-1/2 pack year history. Continues to smoke occasionally. History of hypertension, bronchitis, impaired fasting glucose, GERD, obesity and dyslipidemia.   Past Medical History  Diagnosis Date  . Hypertension   . Bronchitis   . Anxiety   . Elevated fasting glucose   . GERD (gastroesophageal reflux disease)   . Chest pain   . Obesity   . Tobacco abuse   . Hyperlipemia    Past Surgical History  Procedure Laterality Date  . Back surgery  1978  . Tubal ligation    . Lumpectomy left breast      benign  . Reconstructive surgeries on forehead    . Abdominal hysterectomy      endometrial, heavy bleeding  . Sinus surgery  1987   Family History  Problem Relation Age of Onset  . Cancer Mother 52    peritoneal cancer  . Heart disease Mother     bypass  . Hypertension Mother   . Cancer Father 44    lung ca  . Depression Sister   . Stroke Maternal Grandmother   . Heart disease Maternal Grandmother   . Heart disease Maternal Grandfather   . Heart disease Paternal Grandmother   . Heart disease Paternal Grandfather   . Diabetes Neg Hx   . Alcohol abuse Sister   . Liver disease Sister   . Depression Sister    Social History  Substance Use Topics  . Smoking status: Light Tobacco Smoker -- 0.50 packs/day for 43 years   Types: Cigarettes    Last Attempt to Quit: 11/03/2012  . Smokeless tobacco: Never Used     Comment: decreased since may 2015  . Alcohol Use: No   OB History    No data available     Review of Systems  Constitutional: Positive for activity change. Negative for fever, chills, appetite change and fatigue.  HENT: Positive for congestion, postnasal drip and rhinorrhea. Negative for facial swelling and sore throat.   Eyes: Negative.   Respiratory: Positive for cough, shortness of breath and wheezing. Negative for chest tightness.   Cardiovascular: Negative.  Negative for chest pain.  Gastrointestinal: Negative.   Musculoskeletal: Negative.  Negative for neck pain and neck stiffness.  Skin: Negative for pallor and rash.  Neurological: Negative.   Psychiatric/Behavioral: Negative.     Allergies  Naproxen; Aspirin; and Codeine  Home Medications   Prior to Admission medications   Medication Sig Start Date End Date Taking? Authorizing Provider  albuterol (PROVENTIL HFA;VENTOLIN HFA) 108 (90 BASE) MCG/ACT inhaler Inhale 2 puffs into the lungs every 6 (six) hours as needed for wheezing. 05/31/15  Yes Doreene Eland, MD  aspirin 81 MG tablet Take 81 mg by mouth daily.   Yes Historical Provider, MD  carvedilol (COREG CR) 10 MG 24 hr capsule Take 1 capsule (10 mg total) by mouth daily. 08/06/15  Yes Kehinde T  Lum Babe, MD  hydrochlorothiazide (HYDRODIURIL) 25 MG tablet TAKE 1 TABLET EVERY DAY 03/14/15  Yes Doreene Eland, MD  rosuvastatin (CRESTOR) 5 MG tablet Take 1 tablet (5 mg total) by mouth daily. 05/15/15  Yes Doreene Eland, MD  tiotropium (SPIRIVA) 18 MCG inhalation capsule Place 1 capsule (18 mcg total) into inhaler and inhale daily. 07/24/15  Yes Doreene Eland, MD  venlafaxine XR (EFFEXOR-XR) 75 MG 24 hr capsule Take 1 capsule (75 mg total) by mouth daily. 06/04/15  Yes Doreene Eland, MD  Dexlansoprazole (DEXILANT) 30 MG capsule Take 1 capsule (30 mg total) by mouth daily. 03/21/15    Doreene Eland, MD  dicyclomine (BENTYL) 20 MG tablet Take 1 tablet (20 mg total) by mouth 2 (two) times daily. 08/17/15   Doreene Eland, MD  ibuprofen (ADVIL,MOTRIN) 200 MG tablet Take 200 mg by mouth every 6 (six) hours as needed for mild pain.    Historical Provider, MD  nitroGLYCERIN (NITROSTAT) 0.4 MG SL tablet Place 1 tablet (0.4 mg total) under the tongue every 5 (five) minutes as needed for chest pain. Do not exceed 3 doses. Patient not taking: Reported on 02/19/2015 08/10/14   Doreene Eland, MD   Meds Ordered and Administered this Visit   Medications  ipratropium-albuterol (DUONEB) 0.5-2.5 (3) MG/3ML nebulizer solution 3 mL (3 mLs Nebulization Given 09/27/15 1551)  albuterol (PROVENTIL) (2.5 MG/3ML) 0.083% nebulizer solution 2.5 mg (2.5 mg Nebulization Given 09/27/15 1551)    BP 98/65 mmHg  Pulse 86  Temp(Src) 99.4 F (37.4 C) (Oral)  Resp 16  SpO2 95% No data found.   Physical Exam  Constitutional: She is oriented to person, place, and time. She appears well-developed and well-nourished. No distress.  Appears generally well. Nontoxic. Her speech is energetic, and shows no signs of distress. SHe is able to get onto and off the exam table without difficulty and quickly.  HENT:  Mouth/Throat: No oropharyngeal exudate.  Bilateral TMs are normal Oropharynx with minor injection, like clear PND. No exudates.  Eyes: Conjunctivae and EOM are normal.  Neck: Normal range of motion. Neck supple.  Cardiovascular: Normal rate, regular rhythm and normal heart sounds.   Pulmonary/Chest: Effort normal. No respiratory distress. She has wheezes. She has no rales.  Poor air movement bilaterally. Diminished breath sounds particularly with expiration. Few expiratory wheezes.  Musculoskeletal: Normal range of motion. She exhibits no edema.  Lymphadenopathy:    She has no cervical adenopathy.  Neurological: She is alert and oriented to person, place, and time.  Skin: Skin is warm and dry.  No rash noted.  Psychiatric: She has a normal mood and affect.  Nursing note and vitals reviewed.   ED Course  Procedures (including critical care time)  Labs Review Labs Reviewed - No data to display  Imaging Review No results found.   Visual Acuity Review  Right Eye Distance:   Left Eye Distance:   Bilateral Distance:    Right Eye Near:   Left Eye Near:    Bilateral Near:         MDM   1. Other seasonal allergic rhinitis   2. COPD bronchitis    Post DuoNeb 5 mg/2.5 mg the patient states she has modest improvement. The nebulizer causes her to cough more. Auscultation reveals no wheezing or adventitious sounds. There remains a generally diminished air movement and breath sounds due to her COPD. The patient gives 3 antibiotic's that she can get for free and these would not  be a choice for a lower respiratory bacterial infection. She states she has no money to pay for any other anti-biotic. In fact, it is staph that she has a bacterial infection, I suspect she has accommodation of allergy type symptoms in combination with a COPD exacerbation. Differential also includes a viral illness. Continue using your albuterol HFA 2 puffs 4 times a day as needed. Continue using your Spiriva. Follow-up with your PCP. He may want to follow-up with your pulmonologist since she had not seen him in 2 years. Believe her COPD may be getting worse and additional medications may be of benefit. If you have increasing shortness of breath, fever, worsening cough, weakness and feeling worse in general sig medical attention promptly, may need to go to the emergency department.  Hayden Rasmussen, NP 09/27/15 [phone]

## 2015-09-27 NOTE — Discharge Instructions (Signed)
Allergic Rhinitis Recommend Claritin or Zyrtec daily Do not smoke anymore cigarettes Drink plenty of fluids and stay well-hydrated Use her albuterol 4 times a day as directed. Continue the Spiriva Allergic rhinitis is when the mucous membranes in the nose respond to allergens. Allergens are particles in the air that cause your body to have an allergic reaction. This causes you to release allergic antibodies. Through a chain of events, these eventually cause you to release histamine into the blood stream. Although meant to protect the body, it is this release of histamine that causes your discomfort, such as frequent sneezing, congestion, and an itchy, runny nose.  CAUSES  Seasonal allergic rhinitis (hay fever) is caused by pollen allergens that may come from grasses, trees, and weeds. Year-round allergic rhinitis (perennial allergic rhinitis) is caused by allergens such as house dust mites, pet dander, and mold spores.  SYMPTOMS   Nasal stuffiness (congestion).  Itchy, runny nose with sneezing and tearing of the eyes. DIAGNOSIS  Your health care provider can help you determine the allergen or allergens that trigger your symptoms. If you and your health care provider are unable to determine the allergen, skin or blood testing may be used. TREATMENT  Allergic rhinitis does not have a cure, but it can be controlled by:  Medicines and allergy shots (immunotherapy).  Avoiding the allergen. Hay fever may often be treated with antihistamines in pill or nasal spray forms. Antihistamines block the effects of histamine. There are over-the-counter medicines that may help with nasal congestion and swelling around the eyes. Check with your health care provider before taking or giving this medicine.  If avoiding the allergen or the medicine prescribed do not work, there are many new medicines your health care provider can prescribe. Stronger medicine may be used if initial measures are ineffective.  Desensitizing injections can be used if medicine and avoidance does not work. Desensitization is when a patient is given ongoing shots until the body becomes less sensitive to the allergen. Make sure you follow up with your health care provider if problems continue. HOME CARE INSTRUCTIONS It is not possible to completely avoid allergens, but you can reduce your symptoms by taking steps to limit your exposure to them. It helps to know exactly what you are allergic to so that you can avoid your specific triggers. SEEK MEDICAL CARE IF:   You have a fever.  You develop a cough that does not stop easily (persistent).  You have shortness of breath.  You start wheezing.  Symptoms interfere with normal daily activities. Document Released: 09/08/2001 Document Revised: 12/19/2013 Document Reviewed: 08/21/2013 [employer] Patient Information 2015 [place], [place]. This information is not intended to replace advice given to you by your health care provider. Make sure you discuss any questions you have with your health care provider.

## 2015-10-04 ENCOUNTER — Encounter: Payer: Self-pay | Admitting: Family Medicine

## 2015-10-04 ENCOUNTER — Ambulatory Visit (INDEPENDENT_AMBULATORY_CARE_PROVIDER_SITE_OTHER): Payer: No Typology Code available for payment source | Admitting: Family Medicine

## 2015-10-04 VITALS — BP 152/95 | HR 96 | Temp 99.0°F | Wt 185.6 lb

## 2015-10-04 DIAGNOSIS — J40 Bronchitis, not specified as acute or chronic: Secondary | ICD-10-CM

## 2015-10-04 DIAGNOSIS — R110 Nausea: Secondary | ICD-10-CM | POA: Insufficient documentation

## 2015-10-04 MED ORDER — PROMETHAZINE HCL 25 MG PO TABS: 25.0000 mg | ORAL_TABLET | Freq: Three times a day (TID) | ORAL | Status: DC | PRN

## 2015-10-04 MED ORDER — AZITHROMYCIN 250 MG PO TABS: ORAL_TABLET | ORAL | Status: DC

## 2015-10-04 NOTE — Assessment & Plan Note (Signed)
Likely part of her viral illness. GI exam benign. Phenergan prescribed prn nausea. Call if symptoms persist or worsens. She agreed with plan.

## 2015-10-04 NOTE — Patient Instructions (Signed)
  It was nice seeing you today. Your lungs sounds good. You likely have bronchitis which is usually caused by viral infection and can last for up to 3 wks. If no improvement in 1-2 weeks you may pick up the antibiotic script given today. F/U as needed.  Acute Bronchitis Bronchitis is when the airways that extend from the windpipe into the lungs get red, puffy, and painful (inflamed). Bronchitis often causes thick spit (mucus) to develop. This leads to a cough. A cough is the most common symptom of bronchitis. In acute bronchitis, the condition usually begins suddenly and goes away over time (usually in 2 weeks). Smoking, allergies, and asthma can make bronchitis worse. Repeated episodes of bronchitis may cause more lung problems. HOME CARE  Rest.  Drink enough fluids to keep your pee (urine) clear or pale yellow (unless you need to limit fluids as told by your doctor).  Only take over-the-counter or prescription medicines as told by your doctor.  Avoid smoking and secondhand smoke. These can make bronchitis worse. If you are a smoker, think about using nicotine gum or skin patches. Quitting smoking will help your lungs heal faster.  Reduce the chance of getting bronchitis again by:  Washing your hands often.  Avoiding people with cold symptoms.  Trying not to touch your hands to your mouth, nose, or eyes.  Follow up with your doctor as told. GET HELP IF: Your symptoms do not improve after 1 week of treatment. Symptoms include:  Cough.  Fever.  Coughing up thick spit.  Body aches.  Chest congestion.  Chills.  Shortness of breath.  Sore throat. GET HELP RIGHT AWAY IF:   You have an increased fever.  You have chills.  You have severe shortness of breath.  You have bloody thick spit (sputum).  You throw up (vomit) often.  You lose too much body fluid (dehydration).  You have a severe headache.  You faint. MAKE SURE YOU:   Understand these instructions.  Will  watch your condition.  Will get help right away if you are not doing well or get worse.   This information is not intended to replace advice given to you by your health care provider. Make sure you discuss any questions you have with your health care provider.   Document Released: 06/01/2008 Document Revised: 08/16/2013 Document Reviewed: 06/06/2013 Elsevier Interactive Patient Education [employer]

## 2015-10-04 NOTE — Progress Notes (Signed)
Subjective:     Patient ID: Shelly Frazier, female   DOB: 11-Apr-1956, 59 y.o.   MRN: [id number]  Cough The current episode started 1 to 4 weeks ago (Coughing for 2.5 weeks). The problem has been gradually worsening. The problem occurs every few minutes. The cough is productive of sputum. Associated symptoms include shortness of breath and wheezing. Pertinent negatives include no chest pain, fever, nasal congestion, rhinorrhea or sore throat. Nothing aggravates the symptoms. She has tried a beta-agonist inhaler for the symptoms. The treatment provided moderate relief. Her past medical history is significant for COPD.  Patient was recently seen at the urgent care and given breathing treatment. She has been compliant with all her inhalers with minimal improvement. Nausea: C/O intermittent nausea since she started falling sick. Denies abdominal pain, no vomiting, no change in bowel habit, not fever.  Current Outpatient Prescriptions on File Prior to Visit  Medication Sig Dispense Refill  . albuterol (PROVENTIL HFA;VENTOLIN HFA) 108 (90 BASE) MCG/ACT inhaler Inhale 2 puffs into the lungs every 6 (six) hours as needed for wheezing. 1 Inhaler 3  . aspirin 81 MG tablet Take 81 mg by mouth daily.    . carvedilol (COREG CR) 10 MG 24 hr capsule Take 1 capsule (10 mg total) by mouth daily. 90 capsule 1  . Dexlansoprazole (DEXILANT) 30 MG capsule Take 1 capsule (30 mg total) by mouth daily. 90 capsule 1  . dicyclomine (BENTYL) 20 MG tablet Take 1 tablet (20 mg total) by mouth 2 (two) times daily. 60 tablet 2  . hydrochlorothiazide (HYDRODIURIL) 25 MG tablet TAKE 1 TABLET EVERY DAY 90 tablet 2  . ibuprofen (ADVIL,MOTRIN) 200 MG tablet Take 200 mg by mouth every 6 (six) hours as needed for mild pain.    . nitroGLYCERIN (NITROSTAT) 0.4 MG SL tablet Place 1 tablet (0.4 mg total) under the tongue every 5 (five) minutes as needed for chest pain. Do not exceed 3 doses. (Patient not taking: Reported on 02/19/2015) 12  tablet 1  . rosuvastatin (CRESTOR) 5 MG tablet Take 1 tablet (5 mg total) by mouth daily. 90 tablet 1  . tiotropium (SPIRIVA) 18 MCG inhalation capsule Place 1 capsule (18 mcg total) into inhaler and inhale daily. 90 capsule 2  . venlafaxine XR (EFFEXOR-XR) 75 MG 24 hr capsule Take 1 capsule (75 mg total) by mouth daily. 90 capsule 1   No current facility-administered medications on file prior to visit.   Past Medical History  Diagnosis Date  . Hypertension   . Bronchitis   . Anxiety   . Elevated fasting glucose   . GERD (gastroesophageal reflux disease)   . Chest pain   . Obesity   . Tobacco abuse   . Hyperlipemia       Review of Systems  Constitutional: Negative for fever.  HENT: Negative for rhinorrhea and sore throat.   Respiratory: Positive for cough, shortness of breath and wheezing.   Cardiovascular: Negative.  Negative for chest pain.  Gastrointestinal: Negative.   Genitourinary: Negative.   All other systems reviewed and are negative.  Filed Vitals:   10/04/15 1117  BP: 152/95  Pulse: 96  Temp: 99 F (37.2 C)  Weight: 185 lb 9.6 oz (84.188 kg)  SpO2: 100%       Objective:   Physical Exam  Constitutional: She is oriented to person, place, and time. She appears well-developed and well-nourished. No distress.  Eyes: Conjunctivae and EOM are normal. Right eye exhibits no discharge. Left eye exhibits no  discharge.  Neck: Neck supple.  Cardiovascular: Normal rate, regular rhythm and normal heart sounds.   No murmur heard. Pulmonary/Chest: Effort normal and breath sounds normal. No tachypnea. She has no decreased breath sounds. She has no wheezes. She has no rhonchi. She has no rales.  Abdominal: Soft. Bowel sounds are normal. She exhibits no distension. There is no tenderness.  Neurological: She is alert and oriented to person, place, and time.  Nursing note and vitals reviewed.      Assessment:     Bronchitis  Nausea    Plan:     Check problem list.

## 2015-10-04 NOTE — Assessment & Plan Note (Signed)
O2 Sat today at 100% RA. Pulmonary exam completely benign with no wheezing. Patient reassured this is likely viral infection and no antibiotic is needed. She stated she only gets better with antibiotic. Patient advised to pick up Z-Pak if she continues to cough after 1 wk. OTC cough regimen recommended. Return precaution discussed

## 2015-10-22 ENCOUNTER — Ambulatory Visit (INDEPENDENT_AMBULATORY_CARE_PROVIDER_SITE_OTHER): Payer: Self-pay | Admitting: Family Medicine

## 2015-10-22 ENCOUNTER — Encounter: Payer: Self-pay | Admitting: Family Medicine

## 2015-10-22 VITALS — BP 129/76 | HR 82 | Temp 98.3°F | Ht 65.0 in | Wt 187.0 lb

## 2015-10-22 DIAGNOSIS — I10 Essential (primary) hypertension: Secondary | ICD-10-CM

## 2015-10-22 DIAGNOSIS — R51 Headache: Secondary | ICD-10-CM

## 2015-10-22 DIAGNOSIS — R634 Abnormal weight loss: Secondary | ICD-10-CM

## 2015-10-22 DIAGNOSIS — Z23 Encounter for immunization: Secondary | ICD-10-CM

## 2015-10-22 DIAGNOSIS — IMO0001 Reserved for inherently not codable concepts without codable children: Secondary | ICD-10-CM

## 2015-10-22 DIAGNOSIS — J449 Chronic obstructive pulmonary disease, unspecified: Secondary | ICD-10-CM

## 2015-10-22 DIAGNOSIS — R413 Other amnesia: Secondary | ICD-10-CM

## 2015-10-22 DIAGNOSIS — R519 Headache, unspecified: Secondary | ICD-10-CM

## 2015-10-22 LAB — BASIC METABOLIC PANEL
BUN: 19 mg/dL (ref 7–25)
CO2: 31 mmol/L (ref 20–31)
Calcium: 9.4 mg/dL (ref 8.6–10.4)
Chloride: 100 mmol/L (ref 98–110)
Creat: 1.03 mg/dL — ABNORMAL HIGH (ref 0.50–0.99)
GLUCOSE: 106 mg/dL — AB (ref 65–99)
POTASSIUM: 5 mmol/L (ref 3.5–5.3)
SODIUM: 141 mmol/L (ref 135–146)

## 2015-10-22 LAB — CBC WITH DIFFERENTIAL/PLATELET
BASOS ABS: 0 10*3/uL (ref 0.0–0.1)
Basophils Relative: 0 % (ref 0–1)
EOS PCT: 3 % (ref 0–5)
Eosinophils Absolute: 0.2 10*3/uL (ref 0.0–0.7)
HEMATOCRIT: 39.7 % (ref 36.0–46.0)
HEMOGLOBIN: 13.6 g/dL (ref 12.0–15.0)
LYMPHS ABS: 3 10*3/uL (ref 0.7–4.0)
LYMPHS PCT: 38 % (ref 12–46)
MCH: 28.6 pg (ref 26.0–34.0)
MCHC: 34.3 g/dL (ref 30.0–36.0)
MCV: 83.4 fL (ref 78.0–100.0)
MPV: 11.8 fL (ref 8.6–12.4)
Monocytes Absolute: 0.8 10*3/uL (ref 0.1–1.0)
Monocytes Relative: 10 % (ref 3–12)
NEUTROS ABS: 3.9 10*3/uL (ref 1.7–7.7)
Neutrophils Relative %: 49 % (ref 43–77)
Platelets: 226 10*3/uL (ref 150–400)
RBC: 4.76 MIL/uL (ref 3.87–5.11)
RDW: 13.9 % (ref 11.5–15.5)
WBC: 7.9 10*3/uL (ref 4.0–10.5)

## 2015-10-22 LAB — HIV ANTIBODY (ROUTINE TESTING W REFLEX): HIV 1&2 Ab, 4th Generation: NONREACTIVE

## 2015-10-22 LAB — TSH: TSH: 4.381 u[IU]/mL (ref 0.350–4.500)

## 2015-10-22 LAB — VITAMIN B12: VITAMIN B 12: 447 pg/mL (ref 211–911)

## 2015-10-22 NOTE — Progress Notes (Signed)
Subjective:     Patient ID: Shelly Frazier, female   DOB: 1956-10-02, 59 y.o.   MRN: [id number]  HPI  Memory problem: been on going for long and worsening and now she can not remember how long ago this started. She is having issues with short term memory. At the grocery store she forgets what she wants to buy. Denies problem with cooking, she set the timer on the microwave to remind her of her cooking. She does not drive. Weight loss: Here to follow up on her weight loss. She stated her appetite is great. No change in bowel habit. Headache: Still has headache on and off, currently asymptomatic. She denies LOC, no seizure, no change in vision, no fever. She does have her usual stress but nothing new. COPD: She denies cough, no SOB or chest tightness, since she recovered from bronchitis she has been doing well. NWG:[id number] with meds.   Current Outpatient Prescriptions on File Prior to Visit  Medication Sig Dispense Refill  . albuterol (PROVENTIL HFA;VENTOLIN HFA) 108 (90 BASE) MCG/ACT inhaler Inhale 2 puffs into the lungs every 6 (six) hours as needed for wheezing. 1 Inhaler 3  . aspirin 81 MG tablet Take 81 mg by mouth daily.    . carvedilol (COREG CR) 10 MG 24 hr capsule Take 1 capsule (10 mg total) by mouth daily. 90 capsule 1  . Dexlansoprazole (DEXILANT) 30 MG capsule Take 1 capsule (30 mg total) by mouth daily. 90 capsule 1  . dicyclomine (BENTYL) 20 MG tablet Take 1 tablet (20 mg total) by mouth 2 (two) times daily. 60 tablet 2  . hydrochlorothiazide (HYDRODIURIL) 25 MG tablet TAKE 1 TABLET EVERY DAY 90 tablet 2  . ibuprofen (ADVIL,MOTRIN) 200 MG tablet Take 200 mg by mouth every 6 (six) hours as needed for mild pain.    . nitroGLYCERIN (NITROSTAT) 0.4 MG SL tablet Place 1 tablet (0.4 mg total) under the tongue every 5 (five) minutes as needed for chest pain. Do not exceed 3 doses. (Patient not taking: Reported on 02/19/2015) 12 tablet 1  . promethazine (PHENERGAN) 25 MG tablet Take 1 tablet  (25 mg total) by mouth every 8 (eight) hours as needed for nausea or vomiting. 20 tablet 0  . rosuvastatin (CRESTOR) 5 MG tablet Take 1 tablet (5 mg total) by mouth daily. 90 tablet 1  . tiotropium (SPIRIVA) 18 MCG inhalation capsule Place 1 capsule (18 mcg total) into inhaler and inhale daily. 90 capsule 2  . venlafaxine XR (EFFEXOR-XR) 75 MG 24 hr capsule Take 1 capsule (75 mg total) by mouth daily. 90 capsule 1   No current facility-administered medications on file prior to visit.   Past Medical History  Diagnosis Date  . Hypertension   . Bronchitis   . Anxiety   . Elevated fasting glucose   . GERD (gastroesophageal reflux disease)   . Chest pain   . Obesity   . Tobacco abuse   . Hyperlipemia       Review of Systems  Respiratory: Negative.   Cardiovascular: Negative.   Gastrointestinal: Negative.   Genitourinary: Negative.   All other systems reviewed and are negative.      Filed Vitals:   10/22/15 1118  BP: 129/76  Pulse: 82  Temp: 98.3 F (36.8 C)  TempSrc: Oral  Height:  (1.651 m)  Weight: 187 lb (84.823 kg)    Objective:   Physical Exam  Constitutional: She is oriented to person, place, and time. She appears well-developed. No  distress.  Cardiovascular: Normal rate, regular rhythm and normal heart sounds.   No murmur heard. Pulmonary/Chest: Effort normal and breath sounds normal. No respiratory distress. She has no wheezes.  Abdominal: Soft. Bowel sounds are normal. She exhibits no distension and no mass. There is no tenderness.  Musculoskeletal: Normal range of motion. She exhibits no edema.  Neurological: She is alert and oriented to person, place, and time. She has normal strength and normal reflexes. She displays normal reflexes. No cranial nerve deficit or sensory deficit. She displays a negative Romberg sign. She displays no Babinski's sign on the right side. She displays no Babinski's sign on the left side.  Psychiatric: She has a normal mood and  affect. Her behavior is normal. Judgment and thought content normal. She expresses no homicidal and no suicidal ideation.  Mini cog test average  Nursing note and vitals reviewed.     Assessment:     Memory loss: Mild cognitive impairment. Weight loss Headache COPD HTN     Plan:     Check problem list.

## 2015-10-22 NOTE — Assessment & Plan Note (Signed)
Very mild cognitive impairment. Stress vs aging related. R/O metabolic/endocrine causes. TSH, RPR, HIV checked today. Consider neuroimaging if worsening.

## 2015-10-22 NOTE — Assessment & Plan Note (Signed)
BP looks good today. No medication adjustment needed. 

## 2015-10-22 NOTE — Assessment & Plan Note (Signed)
Stable on current regimen. No change indicated.

## 2015-10-22 NOTE — Assessment & Plan Note (Signed)
Currently asymptomatic. No neurologic deficit. ?? Stress vs migraine. Continue Tylenol/Ibuprofen as needed.

## 2015-10-22 NOTE — Assessment & Plan Note (Signed)
May be stress related. She had normal colonoscopy. Instruction given on how to schedule mammogram. Bmet checked today. Improve on diet and use of MVI might help improve or maintain her weight.

## 2015-10-22 NOTE — Patient Instructions (Signed)

## 2015-10-23 ENCOUNTER — Telehealth: Payer: Self-pay | Admitting: Family Medicine

## 2015-10-23 LAB — RPR

## 2015-10-23 NOTE — Telephone Encounter (Signed)
Test result discussed with patient, it looks good. I recommended ASA and fish oil for now for her mild cognitive impairment. We will consider neuroimaging in the future. She agreed with plan.

## 2015-11-04 ENCOUNTER — Other Ambulatory Visit: Payer: Self-pay | Admitting: *Deleted

## 2015-11-05 MED ORDER — ALBUTEROL SULFATE HFA 108 (90 BASE) MCG/ACT IN AERS: 2.0000 | INHALATION_SPRAY | Freq: Four times a day (QID) | RESPIRATORY_TRACT | Status: DC | PRN

## 2015-11-18 ENCOUNTER — Other Ambulatory Visit: Payer: Self-pay | Admitting: Family Medicine

## 2015-11-18 MED ORDER — DICYCLOMINE HCL 20 MG PO TABS: 20.0000 mg | ORAL_TABLET | Freq: Two times a day (BID) | ORAL | Status: DC

## 2015-11-18 NOTE — Addendum Note (Signed)
Addended by: Clovis PuMARTIN, TAMIKA L on: 11/18/2015 02:43 PM   Modules accepted: Orders

## 2015-11-18 NOTE — Telephone Encounter (Signed)
Resent Bentyl to [facility].  Approved, but stated faxed.  Sent electronically.  Clovis PuMartin, Tamika L, RN

## 2015-11-18 NOTE — Telephone Encounter (Signed)
Needs refill on dicyclomine.Shelly Frazier. [facility] [place]. Is out and would like it done today

## 2015-11-27 ENCOUNTER — Other Ambulatory Visit: Payer: Self-pay | Admitting: *Deleted

## 2015-11-27 MED ORDER — VENLAFAXINE HCL ER 75 MG PO CP24: 75.0000 mg | ORAL_CAPSULE | Freq: Every day | ORAL | Status: DC

## 2015-11-27 MED ORDER — ROSUVASTATIN CALCIUM 5 MG PO TABS: 5.0000 mg | ORAL_TABLET | Freq: Every day | ORAL | Status: DC

## 2015-12-09 ENCOUNTER — Encounter: Payer: Self-pay | Admitting: Family Medicine

## 2015-12-10 ENCOUNTER — Other Ambulatory Visit: Payer: Self-pay | Admitting: Family Medicine

## 2015-12-10 MED ORDER — ROSUVASTATIN CALCIUM 5 MG PO TABS: 5.0000 mg | ORAL_TABLET | Freq: Every day | ORAL | Status: DC

## 2015-12-10 MED ORDER — VENLAFAXINE HCL ER 75 MG PO CP24: 75.0000 mg | ORAL_CAPSULE | Freq: Every day | ORAL | Status: DC

## 2015-12-20 ENCOUNTER — Other Ambulatory Visit: Payer: Self-pay | Admitting: Family Medicine

## 2016-01-02 ENCOUNTER — Other Ambulatory Visit: Payer: Self-pay | Admitting: *Deleted

## 2016-01-02 MED ORDER — DEXLANSOPRAZOLE 30 MG PO CPDR: 30.0000 mg | DELAYED_RELEASE_CAPSULE | Freq: Every day | ORAL | Status: DC

## 2016-02-17 ENCOUNTER — Other Ambulatory Visit: Payer: Self-pay | Admitting: Family Medicine

## 2016-02-17 ENCOUNTER — Encounter: Payer: Self-pay | Admitting: Family Medicine

## 2016-02-17 MED ORDER — DICYCLOMINE HCL 20 MG PO TABS: 20.0000 mg | ORAL_TABLET | Freq: Two times a day (BID) | ORAL | Status: DC

## 2016-02-19 ENCOUNTER — Other Ambulatory Visit: Payer: Self-pay | Admitting: *Deleted

## 2016-02-19 MED ORDER — ALBUTEROL SULFATE HFA 108 (90 BASE) MCG/ACT IN AERS
2.0000 | INHALATION_SPRAY | Freq: Four times a day (QID) | RESPIRATORY_TRACT | Status: DC | PRN
Start: 2016-02-19 — End: 2016-07-09

## 2016-02-28 ENCOUNTER — Other Ambulatory Visit: Payer: Self-pay | Admitting: *Deleted

## 2016-02-28 MED ORDER — CARVEDILOL PHOSPHATE ER 10 MG PO CP24: 10.0000 mg | ORAL_CAPSULE | Freq: Every day | ORAL | Status: DC

## 2016-03-26 ENCOUNTER — Telehealth: Payer: Self-pay | Admitting: Family Medicine

## 2016-03-26 ENCOUNTER — Encounter: Payer: Self-pay | Admitting: Family Medicine

## 2016-03-26 NOTE — Telephone Encounter (Signed)
Permission granted

## 2016-03-26 NOTE — Telephone Encounter (Signed)
Ms. Armandina GemmaMcCraw have been trying to get her Coreg refilled thru [facility].  The order was sent, but they haven't received yet.  [facility] and she was told they were sending a new order.  In the meantime patient have not had her medicine.  I have called around to several pharmacies  Including the Out[facility] pharmacy.  The price for 15 capsules is [facility] at [facility].  If they haven't received it by Monday, requesting permission from provider to see Lattie CornsJeannette about using funds from the Indigent fund to pay for patient's script.

## 2016-04-03 ENCOUNTER — Encounter: Payer: Self-pay | Admitting: Family Medicine

## 2016-04-03 ENCOUNTER — Ambulatory Visit (INDEPENDENT_AMBULATORY_CARE_PROVIDER_SITE_OTHER): Payer: Self-pay | Admitting: Family Medicine

## 2016-04-03 VITALS — BP 128/81 | HR 79 | Temp 98.4°F | Wt 198.2 lb

## 2016-04-03 DIAGNOSIS — Z1159 Encounter for screening for other viral diseases: Secondary | ICD-10-CM

## 2016-04-03 DIAGNOSIS — I10 Essential (primary) hypertension: Secondary | ICD-10-CM

## 2016-04-03 DIAGNOSIS — E785 Hyperlipidemia, unspecified: Secondary | ICD-10-CM

## 2016-04-03 DIAGNOSIS — R110 Nausea: Secondary | ICD-10-CM

## 2016-04-03 DIAGNOSIS — F418 Other specified anxiety disorders: Secondary | ICD-10-CM

## 2016-04-03 DIAGNOSIS — M542 Cervicalgia: Secondary | ICD-10-CM

## 2016-04-03 DIAGNOSIS — Z0000 Encounter for general adult medical examination without abnormal findings: Secondary | ICD-10-CM | POA: Insufficient documentation

## 2016-04-03 HISTORY — DX: Cervicalgia: M54.2

## 2016-04-03 HISTORY — DX: Nausea: R11.0

## 2016-04-03 LAB — HEPATITIS C ANTIBODY: HCV AB: NEGATIVE

## 2016-04-03 MED ORDER — NITROGLYCERIN 0.4 MG SL SUBL: 0.4000 mg | SUBLINGUAL_TABLET | SUBLINGUAL | Status: DC | PRN

## 2016-04-03 MED ORDER — PROMETHAZINE HCL 25 MG PO TABS: 25.0000 mg | ORAL_TABLET | Freq: Three times a day (TID) | ORAL | Status: DC | PRN

## 2016-04-03 NOTE — Assessment & Plan Note (Signed)
Hep C screening done today. I advised [facility] to schedule an appointment for  mammogram at: [phone]. She agreed with plan.

## 2016-04-03 NOTE — Patient Instructions (Signed)

## 2016-04-03 NOTE — Progress Notes (Signed)
Subjective:     Patient ID: Shelly Frazier, female   DOB: Oct 12, 1956, 60 y.o.   MRN: [id number]  HPI HTN:She was out of her Coreg for 3 wks because her pharmacy was unable to get the medicine. Eventually she got her medication yesterday and has been taking it since then. She denies any concern today. HLD:She is compliant with her Crestor. Nausea:C/O nausea without vomiting, ongoing for weeks. She stated it started with the season change. She gets nauseous with exposure to pollens. She had used phenergan in the past and she will like refill of med. She denies other GI symptoms. Neck pain:C/O intermittent neck pain. Denies pain now. Depression: She was also off her Effexor for 3 weeks, she restarted back yesterday. She had worsening of her depression while she was off meds but she seems to be going in the right direction now. She denies suicidal ideation. HM: Need mammogram and routine follow up.   Current Outpatient Prescriptions on File Prior to Visit  Medication Sig Dispense Refill  . albuterol (PROVENTIL HFA;VENTOLIN HFA) 108 (90 Base) MCG/ACT inhaler Inhale 2 puffs into the lungs every 6 (six) hours as needed for wheezing. 1 Inhaler 3  . aspirin 81 MG tablet Take 81 mg by mouth daily.    . carvedilol (COREG CR) 10 MG 24 hr capsule Take 1 capsule (10 mg total) by mouth daily. 90 capsule 1  . Dexlansoprazole (DEXILANT) 30 MG capsule Take 1 capsule (30 mg total) by mouth daily. 90 capsule 1  . dicyclomine (BENTYL) 20 MG tablet Take 1 tablet (20 mg total) by mouth 2 (two) times daily. 60 tablet 2  . hydrochlorothiazide (HYDRODIURIL) 25 MG tablet TAKE 1 TABLET BY MOUTH EVERY DAY 90 tablet 2  . ibuprofen (ADVIL,MOTRIN) 200 MG tablet Take 200 mg by mouth every 6 (six) hours as needed for mild pain.    . nitroGLYCERIN (NITROSTAT) 0.4 MG SL tablet Place 1 tablet (0.4 mg total) under the tongue every 5 (five) minutes as needed for chest pain. Do not exceed 3 doses. 12 tablet 1  . promethazine (PHENERGAN)  25 MG tablet Take 1 tablet (25 mg total) by mouth every 8 (eight) hours as needed for nausea or vomiting. 20 tablet 0  . rosuvastatin (CRESTOR) 5 MG tablet Take 1 tablet (5 mg total) by mouth daily. 90 tablet 1  . tiotropium (SPIRIVA) 18 MCG inhalation capsule Place 1 capsule (18 mcg total) into inhaler and inhale daily. 90 capsule 2  . venlafaxine XR (EFFEXOR-XR) 75 MG 24 hr capsule Take 1 capsule (75 mg total) by mouth daily. 90 capsule 1   No current facility-administered medications on file prior to visit.   Past Medical History  Diagnosis Date  . Hypertension   . Bronchitis   . Anxiety   . Elevated fasting glucose   . GERD (gastroesophageal reflux disease)   . Chest pain   . Obesity   . Tobacco abuse   . Hyperlipemia    Filed Vitals:   04/03/16 1051 04/03/16 1114 04/03/16 1115  BP: 150/61 134/116 128/81  Pulse: 86  79  Temp: 98.4 F (36.9 C)    TempSrc: Oral    Weight: 198 lb 3.2 oz (89.903 kg)        Review of Systems  Constitutional: Negative.   Respiratory: Negative.   Cardiovascular: Negative.   Gastrointestinal: Negative.   Musculoskeletal:       Neck pain  Neurological: Negative.   All other systems reviewed and are negative.  Objective:   Physical Exam  Constitutional: She is oriented to person, place, and time. She appears well-developed. No distress.  Cardiovascular: Normal rate, regular rhythm and normal heart sounds.   No murmur heard. Pulmonary/Chest: Effort normal and breath sounds normal. No respiratory distress. She has no wheezes.  Abdominal: Soft. Bowel sounds are normal. She exhibits no distension and no mass. There is no tenderness.  Musculoskeletal: Normal range of motion. She exhibits no edema.       Cervical back: She exhibits normal range of motion, no tenderness, no deformity and no spasm.  Neurological: She is alert and oriented to person, place, and time. No cranial nerve deficit.  Psychiatric: She has a normal mood and affect.  Her behavior is normal. Judgment and thought content normal.  Nursing note and vitals reviewed.      Assessment:     HTN HLD Nausea Neck pain Depression Health maintenance      Plan:     Check problem list.

## 2016-04-03 NOTE — Assessment & Plan Note (Signed)
Unusual presentation for seasonal allergy. Phenergan prescribed for symptomatic treatment. F/U soon if no improvement.

## 2016-04-03 NOTE — Assessment & Plan Note (Signed)
Continue current dose of Crestor. 

## 2016-04-03 NOTE — Assessment & Plan Note (Signed)
Currently asymptomatic. Likely muscle strain. May use tylenol as needed for now. Return soon if worsening.

## 2016-04-03 NOTE — Assessment & Plan Note (Signed)
Not suicidal. Stable on current regiment. F/U as needed.

## 2016-04-03 NOTE — Assessment & Plan Note (Signed)
BP initially elevated but improved with multiple recheck. No dose adjustment needed of her medication now. Continue coreg. She also asked for the refill of her Nitrostat as needed for chest pain. Refill given. S/E to meds discussed.

## 2016-04-06 ENCOUNTER — Telehealth: Payer: Self-pay | Admitting: Family Medicine

## 2016-04-06 NOTE — Telephone Encounter (Signed)
Message left to call back about test result. 

## 2016-04-09 ENCOUNTER — Ambulatory Visit: Payer: Self-pay

## 2016-04-14 ENCOUNTER — Ambulatory Visit: Payer: Self-pay

## 2016-05-14 ENCOUNTER — Encounter: Payer: Self-pay | Admitting: Family Medicine

## 2016-05-14 ENCOUNTER — Other Ambulatory Visit: Payer: Self-pay | Admitting: *Deleted

## 2016-05-14 MED ORDER — DICYCLOMINE HCL 20 MG PO TABS: 20.0000 mg | ORAL_TABLET | Freq: Two times a day (BID) | ORAL | Status: DC

## 2016-06-22 ENCOUNTER — Other Ambulatory Visit: Payer: Self-pay | Admitting: Family Medicine

## 2016-06-22 ENCOUNTER — Encounter: Payer: Self-pay | Admitting: Family Medicine

## 2016-06-22 MED ORDER — VENLAFAXINE HCL ER 75 MG PO CP24: 75.0000 mg | ORAL_CAPSULE | Freq: Every day | ORAL | Status: DC

## 2016-07-02 ENCOUNTER — Other Ambulatory Visit: Payer: Self-pay | Admitting: *Deleted

## 2016-07-02 MED ORDER — TIOTROPIUM BROMIDE MONOHYDRATE 18 MCG IN CAPS: 18.0000 ug | ORAL_CAPSULE | Freq: Every day | RESPIRATORY_TRACT | Status: DC

## 2016-07-09 ENCOUNTER — Other Ambulatory Visit: Payer: Self-pay | Admitting: *Deleted

## 2016-07-09 MED ORDER — ALBUTEROL SULFATE HFA 108 (90 BASE) MCG/ACT IN AERS: 2.0000 | INHALATION_SPRAY | Freq: Four times a day (QID) | RESPIRATORY_TRACT | Status: DC | PRN

## 2016-07-13 ENCOUNTER — Other Ambulatory Visit: Payer: Self-pay | Admitting: *Deleted

## 2016-07-13 MED ORDER — DEXLANSOPRAZOLE 30 MG PO CPDR: 30.0000 mg | DELAYED_RELEASE_CAPSULE | Freq: Every day | ORAL | Status: DC

## 2016-07-30 ENCOUNTER — Encounter: Payer: Self-pay | Admitting: Family Medicine

## 2016-07-30 ENCOUNTER — Telehealth: Payer: Self-pay | Admitting: Family Medicine

## 2016-07-30 NOTE — Telephone Encounter (Signed)
Will forward to MD to advise. Jazmin Hartsell,CMA  

## 2016-07-30 NOTE — Telephone Encounter (Signed)
[facility] blue team,  Please advise patient according to new requirements she will need to be assessed by a psychiatrist for this letter. Since we can't assess her relationship with her dog or know what type of dog she has it will be better off for this to be done by her psychiatrist. Please give her referral instruction.  I also copy Dr. Jennette Kettle on this.

## 2016-07-30 NOTE — Telephone Encounter (Signed)
Pt came by and needs her letter for her pet to stay in home. This just needs to be updated for her new apartment. She would also like a handicap sticker for her new car since she has been diagnose with COPD.Please let patient know when ready to pick up. jw

## 2016-07-31 NOTE — Telephone Encounter (Signed)
LM for patient that placard is ready for pick up and also the list of psych offices to call for pet assessment. Kathryne Ramella,CMA

## 2016-07-31 NOTE — Telephone Encounter (Signed)
Handicap placard placed in dr. Donnetta Hail box.  Will contact patient regarding list of psych when she comes to pick up placard. Shelly Frazier,CMA

## 2016-07-31 NOTE — Telephone Encounter (Signed)
Dr Jennette Kettle can help complete parking handicap form. Please give her list of psychiatrist in town to contact for Pet Emotional Support Assessment visit.

## 2016-07-31 NOTE — Telephone Encounter (Signed)
Spoke with patient and she would like to get the same letter that was written for her previously to allow her to have a pet in her apartment but the date just needs to be updated.  Explained to patient that a psychiatrist would need to evaluate her for this need but she expressed that she doesn't have one.  Also she would like to know if she can get a handicap placard due to her COPD and the fact that the only close parking for her new home is handicap only.  She states that it is too far to park in the regular parking.  If pcp ok's this then patient would like to get it today.  Jazmin Hartsell,CMA

## 2016-08-10 ENCOUNTER — Encounter: Payer: Self-pay | Admitting: Family Medicine

## 2016-08-10 ENCOUNTER — Other Ambulatory Visit: Payer: Self-pay | Admitting: Family Medicine

## 2016-08-10 MED ORDER — DICYCLOMINE HCL 20 MG PO TABS: 20.0000 mg | ORAL_TABLET | Freq: Two times a day (BID) | ORAL | 2 refills | Status: DC

## 2016-08-10 NOTE — Telephone Encounter (Signed)
Pt is calling for a refill on her Bentyl. [place] in [place] and was hoping to get this filled before leaving to go to [place]. jw

## 2016-08-12 ENCOUNTER — Other Ambulatory Visit: Payer: Self-pay | Admitting: *Deleted

## 2016-08-12 MED ORDER — TIOTROPIUM BROMIDE MONOHYDRATE 18 MCG IN CAPS: 18.0000 ug | ORAL_CAPSULE | Freq: Every day | RESPIRATORY_TRACT | 2 refills | Status: DC

## 2016-08-18 ENCOUNTER — Encounter: Payer: Self-pay | Admitting: Family Medicine

## 2016-08-19 ENCOUNTER — Encounter: Payer: Self-pay | Admitting: Family Medicine

## 2016-08-19 ENCOUNTER — Encounter: Payer: Self-pay | Admitting: *Deleted

## 2016-08-19 ENCOUNTER — Telehealth: Payer: Self-pay | Admitting: Family Medicine

## 2016-08-19 NOTE — Telephone Encounter (Signed)
LM for patient to call back.  Also sent mychart message. Chanson Teems,CMA

## 2016-08-19 NOTE — Telephone Encounter (Signed)
Please have patient contact the department to social service for disability paper work. They will refer her to a disability specialist who will eventually write her a letter or given any disability document she needs.

## 2016-08-19 NOTE — Telephone Encounter (Signed)
Will forward to MD. Jazmin Hartsell,CMA  

## 2016-08-19 NOTE — Telephone Encounter (Signed)
Patient came by office requesting pcp write a letter stating she is disabled. Patient needs this to be able to move into a community for the disabled. Please let patient know when this is done.  [phone]

## 2016-08-19 NOTE — Progress Notes (Signed)
Requesting animal/pet emotional support letter. Patient advised to get full evaluation with a psych but she prefers a letter from me. Letter written stating per patient she does well with her dog, however I have not assessed or observed patient's interaction with her dog or dog's interaction with other people to confirm or allude to its safety.

## 2016-08-21 ENCOUNTER — Telehealth: Payer: Self-pay | Admitting: Family Medicine

## 2016-08-21 NOTE — Telephone Encounter (Signed)
Patient brought letter in from the social security administration showing her monthly payment.  Pt. Stated this shows she is disabled.  Pt. Needs letter for her new landlord and letter to let them know she needs her cat as a service animal. Please let pt. Know when she may pick up. [phone]

## 2016-08-22 ENCOUNTER — Encounter: Payer: Self-pay | Admitting: Family Medicine

## 2016-08-22 NOTE — Telephone Encounter (Signed)
I called patient. Message left to call back.   1. I already gave the letter she wanted and I was informed she came to pick it up. If she is needing to change the content of the letter, it will not be changed. She need a psychiatry evaluation with her Pet to obtain certificate for emotional pet support. I don't do that.  2. The disability letter on file from 2014 stated she is undergoing evaluation and she has depression and anxiety. It is a letter of support for her disability assessment, it does not confirm she has disability. The normal process is to obtain letter of support from her PCP which I gave, she takes letter to DSS who sets her up with a disability doctor to evaluate and approve her disability.  3. I will review her disability letter and write another letter of support referring her housing to refer to her disability letter. Or better still submit the DSS disability letter to her housing management. That will carry more weight.

## 2016-08-24 ENCOUNTER — Encounter: Payer: Self-pay | Admitting: Family Medicine

## 2016-08-24 NOTE — Telephone Encounter (Signed)
[facility] in your box just as an fyi.

## 2016-08-24 NOTE — Progress Notes (Signed)
[facility] Blue Team  Please call to advise patient that are letters are ready for pick up.  I just read the disability letter she brought in. It does not say anything about her being disabled. Please advise her to pick up the letter and take copy to her housing agents.  Letters to pick up are  1. Emotional Pet  Letter. 2. Reprinted disability assessment letter. 3. The disability letter she brought in.

## 2016-08-24 NOTE — Telephone Encounter (Signed)
I checked my box twice this morning and didn't find it. Will go check again.

## 2016-09-04 ENCOUNTER — Ambulatory Visit (INDEPENDENT_AMBULATORY_CARE_PROVIDER_SITE_OTHER): Payer: Medicaid Other | Admitting: Family Medicine

## 2016-09-04 ENCOUNTER — Encounter: Payer: Self-pay | Admitting: Family Medicine

## 2016-09-04 DIAGNOSIS — F418 Other specified anxiety disorders: Secondary | ICD-10-CM

## 2016-09-04 DIAGNOSIS — I10 Essential (primary) hypertension: Secondary | ICD-10-CM

## 2016-09-04 DIAGNOSIS — Z23 Encounter for immunization: Secondary | ICD-10-CM | POA: Diagnosis present

## 2016-09-04 DIAGNOSIS — E785 Hyperlipidemia, unspecified: Secondary | ICD-10-CM

## 2016-09-04 MED ORDER — ZOSTER VACCINE LIVE 19400 UNT/0.65ML ~~LOC~~ SUSR: 0.6500 mL | Freq: Once | SUBCUTANEOUS | 0 refills | Status: AC

## 2016-09-04 MED ORDER — HYDROCHLOROTHIAZIDE 25 MG PO TABS: 25.0000 mg | ORAL_TABLET | Freq: Every day | ORAL | 1 refills | Status: DC

## 2016-09-04 MED ORDER — ALBUTEROL SULFATE HFA 108 (90 BASE) MCG/ACT IN AERS: 2.0000 | INHALATION_SPRAY | Freq: Four times a day (QID) | RESPIRATORY_TRACT | 3 refills | Status: DC | PRN

## 2016-09-04 MED ORDER — CARVEDILOL PHOSPHATE ER 10 MG PO CP24: 10.0000 mg | ORAL_CAPSULE | Freq: Every day | ORAL | 1 refills | Status: DC

## 2016-09-04 MED ORDER — DEXLANSOPRAZOLE 30 MG PO CPDR: 30.0000 mg | DELAYED_RELEASE_CAPSULE | Freq: Every day | ORAL | 1 refills | Status: DC

## 2016-09-04 MED ORDER — VENLAFAXINE HCL ER 75 MG PO CP24: 75.0000 mg | ORAL_CAPSULE | Freq: Every day | ORAL | 1 refills | Status: DC

## 2016-09-04 MED ORDER — NITROGLYCERIN 0.4 MG SL SUBL: 0.4000 mg | SUBLINGUAL_TABLET | SUBLINGUAL | 1 refills | Status: DC | PRN

## 2016-09-04 MED ORDER — TIOTROPIUM BROMIDE MONOHYDRATE 18 MCG IN CAPS: 18.0000 ug | ORAL_CAPSULE | Freq: Every day | RESPIRATORY_TRACT | 1 refills | Status: DC

## 2016-09-04 MED ORDER — ROSUVASTATIN CALCIUM 5 MG PO TABS: 5.0000 mg | ORAL_TABLET | Freq: Every day | ORAL | 1 refills | Status: DC

## 2016-09-04 NOTE — Assessment & Plan Note (Signed)
Improved a lot after social situation was addressed. Continue current dose of Effexor. Med refilled today.

## 2016-09-04 NOTE — Patient Instructions (Signed)
It was nice seeing you again. I am so happy for you. I am also proud of you trying to live a better life and attempting to quit smoking. Please go to the pharmacy for your shingles and make you mammogram appointment. I will see you in the next 4-6 months.

## 2016-09-04 NOTE — Assessment & Plan Note (Signed)
BP looks good today. I refilled her meds. F/U in 3-6 months.

## 2016-09-04 NOTE — Progress Notes (Signed)
Subjective:     Patient ID: Shelly Frazier, female   DOB: 1956-03-21, 60 y.o.   MRN: [id number]  HPI [id number] for follow up. HLD: Here for follow up. No concern. Depression: Patient happy today, she stated she is in a much better place, a new home, she just got a new car and her social service disability was approved. Things are going on well in general. HM: Here for follow up.  Current Outpatient Prescriptions on File Prior to Visit  Medication Sig Dispense Refill  . albuterol (PROVENTIL HFA;VENTOLIN HFA) 108 (90 Base) MCG/ACT inhaler Inhale 2 puffs into the lungs every 6 (six) hours as needed for wheezing. 1 Inhaler 3  . aspirin 81 MG tablet Take 81 mg by mouth daily.    . carvedilol (COREG CR) 10 MG 24 hr capsule Take 1 capsule (10 mg total) by mouth daily. 90 capsule 1  . Dexlansoprazole (DEXILANT) 30 MG capsule Take 1 capsule (30 mg total) by mouth daily. 90 capsule 1  . dicyclomine (BENTYL) 20 MG tablet Take 1 tablet (20 mg total) by mouth 2 (two) times daily. 60 tablet 2  . hydrochlorothiazide (HYDRODIURIL) 25 MG tablet TAKE 1 TABLET BY MOUTH EVERY DAY 90 tablet 2  . rosuvastatin (CRESTOR) 5 MG tablet Take 1 tablet (5 mg total) by mouth daily. 90 tablet 1  . tiotropium (SPIRIVA) 18 MCG inhalation capsule Place 1 capsule (18 mcg total) into inhaler and inhale daily. 90 capsule 2  . venlafaxine XR (EFFEXOR-XR) 75 MG 24 hr capsule Take 1 capsule (75 mg total) by mouth daily. 90 capsule 1  . ibuprofen (ADVIL,MOTRIN) 200 MG tablet Take 200 mg by mouth every 6 (six) hours as needed for mild pain.    . nitroGLYCERIN (NITROSTAT) 0.4 MG SL tablet Place 1 tablet (0.4 mg total) under the tongue every 5 (five) minutes as needed for chest pain. Do not exceed 3 doses. (Patient not taking: Reported on 09/04/2016) 12 tablet 1  . promethazine (PHENERGAN) 25 MG tablet Take 1 tablet (25 mg total) by mouth every 8 (eight) hours as needed for nausea or vomiting. 20 tablet 0   No current facility-administered  medications on file prior to visit.    Past Medical History:  Diagnosis Date  . Anxiety   . Bronchitis   . Chest pain   . Elevated fasting glucose   . GERD (gastroesophageal reflux disease)   . Hyperlipemia   . Hypertension   . Obesity   . Tobacco abuse      Review of Systems  Respiratory: Negative.   Cardiovascular: Negative.   Gastrointestinal: Negative.   Skin: Negative.   All other systems reviewed and are negative.      Objective:   Physical Exam  Constitutional: She is oriented to person, place, and time. She appears well-developed. No distress.  Cardiovascular: Normal rate, regular rhythm and normal heart sounds.   No murmur heard. Pulmonary/Chest: Effort normal and breath sounds normal. No respiratory distress. She has no wheezes.  Abdominal: Soft. Bowel sounds are normal. She exhibits no distension. There is no tenderness.  Musculoskeletal: Normal range of motion. She exhibits no edema.  Neurological: She is alert and oriented to person, place, and time.  Psychiatric: She has a normal mood and affect. Her behavior is normal. Judgment and thought content normal.  Nursing note and vitals reviewed.      Assessment:     HTN HLD Depression Health maintenance.    Plan:    Check problem list.  Not for health maintenance, flu shot given today. Instruction given to get Zostavax at the pharmacy, vaccine e-scribed. She was also given mammogram slip to schedule appointment. She verbalized understanding.

## 2016-09-04 NOTE — Assessment & Plan Note (Signed)
Stable on Crestor. Med refilled today.

## 2016-09-11 ENCOUNTER — Other Ambulatory Visit: Payer: Self-pay | Admitting: Obstetrics and Gynecology

## 2016-09-11 ENCOUNTER — Encounter: Payer: Self-pay | Admitting: Obstetrics and Gynecology

## 2016-09-11 ENCOUNTER — Ambulatory Visit (INDEPENDENT_AMBULATORY_CARE_PROVIDER_SITE_OTHER): Payer: Medicaid Other | Admitting: Obstetrics and Gynecology

## 2016-09-11 VITALS — BP 128/74 | HR 88 | Temp 98.3°F | Ht 65.0 in | Wt 187.0 lb

## 2016-09-11 DIAGNOSIS — J209 Acute bronchitis, unspecified: Secondary | ICD-10-CM

## 2016-09-11 MED ORDER — BENZONATATE 100 MG PO CAPS: 200.0000 mg | ORAL_CAPSULE | Freq: Three times a day (TID) | ORAL | 0 refills | Status: DC | PRN

## 2016-09-11 MED ORDER — HYDROCOD POLST-CPM POLST ER 10-8 MG/5ML PO SUER: 5.0000 mL | Freq: Two times a day (BID) | ORAL | 0 refills | Status: DC | PRN

## 2016-09-11 NOTE — Patient Instructions (Signed)
Appears viral in nature. Prescribed medications to help with symptoms If not better in about a week please return to clinic or call PCP  Acute Bronchitis Bronchitis is when the airways that extend from the windpipe into the lungs get red, puffy, and painful (inflamed). Bronchitis often causes thick spit (mucus) to develop. This leads to a cough. A cough is the most common symptom of bronchitis. In acute bronchitis, the condition usually begins suddenly and goes away over time (usually in 2 weeks). Smoking, allergies, and asthma can make bronchitis worse. Repeated episodes of bronchitis may cause more lung problems. HOME CARE  Rest.  Drink enough fluids to keep your pee (urine) clear or pale yellow (unless you need to limit fluids as told by your doctor).  Only take over-the-counter or prescription medicines as told by your doctor.  Avoid smoking and secondhand smoke. These can make bronchitis worse. If you are a smoker, think about using nicotine gum or skin patches. Quitting smoking will help your lungs heal faster.  Reduce the chance of getting bronchitis again by:  Washing your hands often.  Avoiding people with cold symptoms.  Trying not to touch your hands to your mouth, nose, or eyes.  Follow up with your doctor as told. GET HELP IF: Your symptoms do not improve after 1 week of treatment. Symptoms include:  Cough.  Fever.  Coughing up thick spit.  Body aches.  Chest congestion.  Chills.  Shortness of breath.  Sore throat. GET HELP RIGHT AWAY IF:   You have an increased fever.  You have chills.  You have severe shortness of breath.  You have bloody thick spit (sputum).  You throw up (vomit) often.  You lose too much body fluid (dehydration).  You have a severe headache.  You faint. MAKE SURE YOU:   Understand these instructions.  Will watch your condition.  Will get help right away if you are not doing well or get worse.   This information is  not intended to replace advice given to you by your health care provider. Make sure you discuss any questions you have with your health care provider.   Document Released: 06/01/2008 Document Revised: 08/16/2013 Document Reviewed: 06/06/2013 Elsevier Interactive Patient Education [employer]2016 Elsevier Inc.

## 2016-09-11 NOTE — Progress Notes (Signed)
   Subjective:   Patient ID: Shelly Frazier, female    DOB: 02/27/1956, 60 y.o.   MRN: [id number]  Patient presents for Same Day Appointment  Chief Complaint  Patient presents with  . Bronchitis    HPI: # Bronchitis:  Patient states that she believes she is starting to get bronchitis again. She states she gets bronchitis once a year. Symptoms started Monday. Past medical history significant for COPD and allergies. Endorses coughing up yellow-green sputum. Denies any worsening of shortness of breath or respiratory distress. States that she is unable to sleep due to coughing fits. Having associated congestion and rhinorrhea. Having some low-grade fevers with temperatures around 100F for the last 2 days.  Review of Systems   See HPI for ROS.   Smoking status - current daily smoker now down to 2-3 cigs a day; has e-cig  Past medical history, surgical, family, and social history reviewed and updated in the EMR as appropriate.  Objective:  BP 128/74   Pulse 88   Temp 98.3 F (36.8 C) (Oral)   Ht 5\' 5"  (1.651 m)   Wt 187 lb (84.8 kg)   SpO2 95%   BMI 31.12 kg/m  Vitals and nursing note reviewed  Physical Exam  Constitutional: She is well-developed, well-nourished, and in no distress.  HENT:  Mouth/Throat: Oropharynx is clear and moist.  Eyes: Conjunctivae and EOM are normal.  Cardiovascular: Normal rate and regular rhythm.   Pulmonary/Chest: Effort normal and breath sounds normal. She has no wheezes.  Skin: Skin is warm and dry.    Assessment & Plan:  1. Acute bronchitis, unspecified organism Benign exam with stable vitals. Consistent with a viral bronchitis. No need for Abx at this time. Conservative management to help with symptom control. Return precautions given. To follow-up with PCP if symptoms have not improved in a week. Handout given.    Meds ordered this encounter  Medications  . benzonatate (TESSALON) 100 MG capsule    Sig: Take 2 capsules (200 mg total) by  mouth 3 (three) times daily as needed for cough.    Dispense:  30 capsule    Refill:  0  . chlorpheniramine-HYDROcodone (TUSSIONEX PENNKINETIC ER) 10-8 MG/5ML SUER    Sig: Take 5 mLs by mouth every 12 (twelve) hours as needed for cough.    Dispense:  115 mL    Refill:  0      Caryl AdaJazma Alexanderia Gorby, DO 09/11/2016, 11:46 AM PGY-3, [facility]

## 2016-09-14 NOTE — Telephone Encounter (Signed)
Talked to [place] on Friday afternoon. Patient should have picked up paper Rx. Will close this and refuse at this time.

## 2016-09-15 ENCOUNTER — Telehealth: Payer: Self-pay | Admitting: *Deleted

## 2016-09-15 NOTE — Telephone Encounter (Signed)
Dr. Randolm IdolFletke is covering. Please pass form along to him for completion. Thanks.

## 2016-09-15 NOTE — Telephone Encounter (Signed)
As a non-urgent matter I will let Dr. Lum BabeEniola address when she returns next week.

## 2016-09-15 NOTE — Telephone Encounter (Signed)
Prior Authorization received from [facility] for Dexilant 30 mg and Rosuvastatin 5 mg. Formulary and PA form placed in provider box for completion. Clovis PuMartin, Zeynep Fantroy L, RN

## 2016-09-17 ENCOUNTER — Other Ambulatory Visit: Payer: Self-pay | Admitting: Family Medicine

## 2016-09-17 MED ORDER — PANTOPRAZOLE SODIUM 20 MG PO TBEC: 20.0000 mg | DELAYED_RELEASE_TABLET | Freq: Every day | ORAL | 1 refills | Status: DC | PRN

## 2016-09-17 NOTE — Telephone Encounter (Signed)
I received prior authorization for Dexilant. She was started on medication because that is the brand carried by [employer]. Now she is no more on [employer] hence will need a change to her meds. I will switch her to protonix.    [facility], please call to advise patient of her med change.  Med printed and placed in the fax box.

## 2016-09-18 ENCOUNTER — Other Ambulatory Visit: Payer: Self-pay | Admitting: Family Medicine

## 2016-10-18 ENCOUNTER — Encounter: Payer: Self-pay | Admitting: Family Medicine

## 2016-10-23 ENCOUNTER — Encounter: Payer: Self-pay | Admitting: Family Medicine

## 2016-10-23 ENCOUNTER — Telehealth: Payer: Self-pay | Admitting: Family Medicine

## 2016-10-23 NOTE — Telephone Encounter (Signed)
Thanks. Upon reviewing this, I believe message was sent to Dr.Fletke who was covering my box while on vacation at the time and I did not get a prior authorization request or form when I got back from vacation. I will discuss this with Tamika on Monday.

## 2016-10-23 NOTE — Progress Notes (Signed)
I got a message about medication needing prior authorization today for the first time.  Per documentation, initial request was 09/15/16. This message was sent to covering provider while I was on vacation.  I called patient, she stated she called twice this week already but message never got to me. She also sent a Mychart email which didn't get to me either.  She stated she has been out of her meds for two week. She state she feels her heart is racing. She did not check her BP. I advised her since clinic is closed to go to the ED for BP and HR check and they might be able to provide her sample medication till Monday. She verbalized understanding.  I contacted the pharmacy and asked for them to re-fax prior -Auth request again. I was told this was faxed to our clinic Oct 22nd and 26th, although I never got the form. I reconfirmed the fax number and the pharmacist stated she will fax again tonight. I will watch out for this on Monday.

## 2016-10-23 NOTE — Telephone Encounter (Signed)
Patient has been waiting on PA for Coreg and Crestor for over a month now (please refer to Telephone msg on 09/15/16). Patient has been out of meds since last month. Please call patient to update her on this matter.

## 2016-10-23 NOTE — Telephone Encounter (Signed)
Will forward to MD to see if PA ever completed. Camylle Whicker, Maryjo RochesterJessica Dawn, CMA

## 2016-10-26 ENCOUNTER — Other Ambulatory Visit: Payer: Self-pay | Admitting: *Deleted

## 2016-10-26 ENCOUNTER — Other Ambulatory Visit: Payer: Self-pay | Admitting: Family Medicine

## 2016-10-26 MED ORDER — CARVEDILOL 6.25 MG PO TABS
6.2500 mg | ORAL_TABLET | Freq: Two times a day (BID) | ORAL | 1 refills | Status: DC
Start: 2016-10-26 — End: 2016-12-04

## 2016-10-26 MED ORDER — ATORVASTATIN CALCIUM 20 MG PO TABS: 20.0000 mg | ORAL_TABLET | Freq: Every day | ORAL | 1 refills | Status: DC

## 2016-10-26 NOTE — Telephone Encounter (Signed)
Detailed message left for pt informing her of medication changes and that they should be ready for pick up this morning.

## 2016-10-26 NOTE — Telephone Encounter (Signed)
I spoke with pharmacy again this morning. I was informed that regular coreg would be covered over extended release and Crestor would need prior authorization. I believe she was on Crestor because that was the Statin brand carried by [employer] at the time she was with them. Now that she has an insurance it will be appropriate to switch her to something else that would be covered.  Coreg CR changed to Coreg 6.25 mg BID Crestor switched to Atorvastatin 20 mg qhs.   [facility] blue team,  Please contact patient and let her know that her medications has been changed and she should be able to pick up meds today.

## 2016-10-26 NOTE — Telephone Encounter (Signed)
This has already been taken care by Dr. Lum BabeEniola.

## 2016-10-28 MED ORDER — VENLAFAXINE HCL ER 75 MG PO CP24: 75.0000 mg | ORAL_CAPSULE | Freq: Every day | ORAL | 1 refills | Status: DC

## 2016-10-28 MED ORDER — ALBUTEROL SULFATE HFA 108 (90 BASE) MCG/ACT IN AERS: 2.0000 | INHALATION_SPRAY | Freq: Four times a day (QID) | RESPIRATORY_TRACT | 3 refills | Status: DC | PRN

## 2016-10-29 ENCOUNTER — Other Ambulatory Visit: Payer: Self-pay | Admitting: Family Medicine

## 2016-10-29 MED ORDER — VENLAFAXINE HCL ER 75 MG PO CP24: 75.0000 mg | ORAL_CAPSULE | Freq: Every day | ORAL | 1 refills | Status: DC

## 2016-10-29 MED ORDER — ALBUTEROL SULFATE HFA 108 (90 BASE) MCG/ACT IN AERS: 2.0000 | INHALATION_SPRAY | Freq: Four times a day (QID) | RESPIRATORY_TRACT | 3 refills | Status: DC | PRN

## 2016-11-01 ENCOUNTER — Other Ambulatory Visit: Payer: Self-pay | Admitting: Family Medicine

## 2016-11-02 MED ORDER — DICYCLOMINE HCL 20 MG PO TABS: 20.0000 mg | ORAL_TABLET | Freq: Two times a day (BID) | ORAL | 2 refills | Status: DC

## 2016-12-02 ENCOUNTER — Other Ambulatory Visit: Payer: Self-pay | Admitting: *Deleted

## 2016-12-02 MED ORDER — VENLAFAXINE HCL ER 75 MG PO CP24: 75.0000 mg | ORAL_CAPSULE | Freq: Every day | ORAL | 1 refills | Status: DC

## 2016-12-02 MED ORDER — NITROGLYCERIN 0.4 MG SL SUBL: 0.4000 mg | SUBLINGUAL_TABLET | SUBLINGUAL | 1 refills | Status: AC | PRN

## 2016-12-04 ENCOUNTER — Other Ambulatory Visit: Payer: Self-pay | Admitting: *Deleted

## 2016-12-04 MED ORDER — ALBUTEROL SULFATE HFA 108 (90 BASE) MCG/ACT IN AERS: 2.0000 | INHALATION_SPRAY | Freq: Four times a day (QID) | RESPIRATORY_TRACT | 3 refills | Status: DC | PRN

## 2016-12-04 MED ORDER — CARVEDILOL 6.25 MG PO TABS: 6.2500 mg | ORAL_TABLET | Freq: Two times a day (BID) | ORAL | 1 refills | Status: DC

## 2017-02-03 ENCOUNTER — Other Ambulatory Visit: Payer: Self-pay | Admitting: Family Medicine

## 2017-02-03 NOTE — Telephone Encounter (Signed)
Refill completed. Thanks.

## 2017-02-03 NOTE — Telephone Encounter (Signed)
Pt needs a refill on dicyclomine. [facility] [address] ep

## 2017-02-15 ENCOUNTER — Encounter (HOSPITAL_COMMUNITY): Payer: Self-pay

## 2017-02-15 ENCOUNTER — Emergency Department (HOSPITAL_COMMUNITY): Payer: Medicaid Other

## 2017-02-15 ENCOUNTER — Emergency Department (HOSPITAL_COMMUNITY)
Admission: EM | Admit: 2017-02-15 | Discharge: 2017-02-15 | Disposition: A | Discharge status: Home / Self Care | Payer: Medicaid Other | Attending: Emergency Medicine | Admitting: Emergency Medicine

## 2017-02-15 DIAGNOSIS — S63501A Unspecified sprain of right wrist, initial encounter: Secondary | ICD-10-CM | POA: Insufficient documentation

## 2017-02-15 DIAGNOSIS — Y939 Activity, unspecified: Secondary | ICD-10-CM | POA: Insufficient documentation

## 2017-02-15 DIAGNOSIS — Y929 Unspecified place or not applicable: Secondary | ICD-10-CM | POA: Diagnosis not present

## 2017-02-15 DIAGNOSIS — Z79899 Other long term (current) drug therapy: Secondary | ICD-10-CM | POA: Diagnosis not present

## 2017-02-15 DIAGNOSIS — W010XXA Fall on same level from slipping, tripping and stumbling without subsequent striking against object, initial encounter: Secondary | ICD-10-CM | POA: Insufficient documentation

## 2017-02-15 DIAGNOSIS — Z7982 Long term (current) use of aspirin: Secondary | ICD-10-CM | POA: Diagnosis not present

## 2017-02-15 DIAGNOSIS — J449 Chronic obstructive pulmonary disease, unspecified: Secondary | ICD-10-CM | POA: Diagnosis not present

## 2017-02-15 DIAGNOSIS — F17210 Nicotine dependence, cigarettes, uncomplicated: Secondary | ICD-10-CM | POA: Diagnosis not present

## 2017-02-15 DIAGNOSIS — Y999 Unspecified external cause status: Secondary | ICD-10-CM | POA: Diagnosis not present

## 2017-02-15 DIAGNOSIS — I10 Essential (primary) hypertension: Secondary | ICD-10-CM | POA: Insufficient documentation

## 2017-02-15 DIAGNOSIS — S6991XA Unspecified injury of right wrist, hand and finger(s), initial encounter: Secondary | ICD-10-CM | POA: Diagnosis present

## 2017-02-15 MED ORDER — OXYCODONE-ACETAMINOPHEN 5-325 MG PO TABS: 1.0000 | ORAL_TABLET | ORAL | 0 refills | Status: DC | PRN

## 2017-02-15 MED ORDER — OXYCODONE-ACETAMINOPHEN 5-325 MG PO TABS
1.0000 | ORAL_TABLET | Freq: Once | ORAL | Status: AC
  Administered 2017-02-15: 1 via ORAL
  Filled 2017-02-15: qty 1

## 2017-02-15 NOTE — ED Triage Notes (Signed)
Pt states that she fell over a cat toy yesterday morning. She caught herself with her R wrist. Denies LOC, denies blood thinners or hitting head. R wrist is swollen and bruised. She also endorses problems gripping.  A&Ox4.

## 2017-02-15 NOTE — ED Notes (Signed)
Patient was alert, oriented and stable upon discharge. RN went over AVS and patient had no further questions.  Patient was instructed not to drive or operate heavy machinery on narcotic pain medication.   

## 2017-02-15 NOTE — ED Provider Notes (Addendum)
[facility]-EMERGENCY DEPT Provider Note   CSN: [id number] Arrival date & time: 02/15/17  1554  By signing my name below, I, Shelly Frazier, attest that this documentation has been prepared under the direction and in the presence of [employer], PA-C. Electronically Signed: Sonum Frazier, Neurosurgeon. 02/15/17. 5:04 PM.  History   Chief Complaint Chief Complaint  Patient presents with  . Wrist Pain    Right     The history is provided by the patient. No language interpreter was used.    HPI Comments: Shelly Frazier is a 61 y.o. female who presents to the Emergency Department complaining of constant, unchanged right wrist pain that began yesterday after a fall. She states she tripped and fell onto an outstretched right hand. She denies head injury or LOC. She denies elbow or shoulder injury, numbness, paresthesia. She is right hand dominant.    Past Medical History:  Diagnosis Date  . Anxiety   . Bronchitis   . Chest pain   . Elevated fasting glucose   . GERD (gastroesophageal reflux disease)   . Hyperlipemia   . Hypertension   . Obesity   . Tobacco abuse     Patient Active Problem List   Diagnosis Date Noted  . Nausea without vomiting 04/03/2016  . Neck pain 04/03/2016  . Memory loss 08/20/2015  . Loss of weight 08/20/2015  . Stress incontinence 08/31/2014  . Obesity, unspecified 08/10/2014  . Insomnia 04/13/2014  . Depression with anxiety 10/27/2013  . Smoker 09/29/2012  . COPD with bronchitis Gold B 09/28/2012  . GERD (gastroesophageal reflux disease) 03/29/2012  . Hyperlipidemia 03/09/2012  . Hypertension 03/08/2012    Past Surgical History:  Procedure Laterality Date  . ABDOMINAL HYSTERECTOMY     endometrial, heavy bleeding  . BACK SURGERY  1978  . lumpectomy left breast     benign  . reconstructive surgeries on forehead    . sinus surgery  1987  . TUBAL LIGATION      OB History    No data available       Home Medications    Prior to Admission medications     Medication Sig Start Date End Date Taking? Authorizing Provider  albuterol (PROVENTIL HFA;VENTOLIN HFA) 108 (90 Base) MCG/ACT inhaler Inhale 2 puffs into the lungs every 6 (six) hours as needed for wheezing. 12/04/16   Doreene Eland, MD  aspirin 81 MG tablet Take 81 mg by mouth daily.    Historical Provider, MD  atorvastatin (LIPITOR) 20 MG tablet Take 1 tablet (20 mg total) by mouth at bedtime. 10/26/16   Doreene Eland, MD  benzonatate (TESSALON) 100 MG capsule Take 2 capsules (200 mg total) by mouth 3 (three) times daily as needed for cough. 09/11/16   Pincus Large, DO  carvedilol (COREG) 6.25 MG tablet Take 1 tablet (6.25 mg total) by mouth 2 (two) times daily with a meal. 12/04/16   Doreene Eland, MD  chlorpheniramine-HYDROcodone (TUSSIONEX PENNKINETIC ER) 10-8 MG/5ML SUER Take 5 mLs by mouth every 12 (twelve) hours as needed for cough. 09/11/16   Pincus Large, DO  dicyclomine (BENTYL) 20 MG tablet TAKE 1 TABLET TWICE A DAY 02/03/17   Doreene Eland, MD  hydrochlorothiazide (HYDRODIURIL) 25 MG tablet TAKE 1 TABLET BY MOUTH EVERY DAY 09/18/16   Doreene Eland, MD  ibuprofen (ADVIL,MOTRIN) 200 MG tablet Take 200 mg by mouth every 6 (six) hours as needed for mild pain.    Historical Provider, MD  nitroGLYCERIN (  NITROSTAT) 0.4 MG SL tablet Place 1 tablet (0.4 mg total) under the tongue every 5 (five) minutes as needed for chest pain. Do not exceed 3 doses. 12/02/16   Doreene Eland, MD  oxyCODONE-acetaminophen (PERCOCET/ROXICET) 5-325 MG tablet Take 1 tablet by mouth every 4 (four) hours as needed for severe pain. 02/15/17   Eyvonne Mechanic, PA-C  pantoprazole (PROTONIX) 20 MG tablet Take 1 tablet (20 mg total) by mouth daily as needed. Use only as needed. Note prolonged use of medication can lead to kidney impairment among other side effect. 09/17/16   Doreene Eland, MD  promethazine (PHENERGAN) 25 MG tablet Take 1 tablet (25 mg total) by mouth every 8 (eight) hours as needed for nausea or  vomiting. 04/03/16   Doreene Eland, MD  tiotropium (SPIRIVA) 18 MCG inhalation capsule Place 1 capsule (18 mcg total) into inhaler and inhale daily. 09/04/16   Doreene Eland, MD  venlafaxine XR (EFFEXOR-XR) 75 MG 24 hr capsule Take 1 capsule (75 mg total) by mouth daily. 12/02/16   Doreene Eland, MD    Family History Family History  Problem Relation Age of Onset  . Cancer Mother 62    peritoneal cancer  . Heart disease Mother     bypass  . Hypertension Mother   . Cancer Father 34    lung ca  . Depression Sister   . Alcohol abuse Sister   . Liver disease Sister   . Depression Sister   . Stroke Maternal Grandmother   . Heart disease Maternal Grandmother   . Heart disease Maternal Grandfather   . Heart disease Paternal Grandmother   . Heart disease Paternal Grandfather   . Diabetes Neg Hx     Social History Social History  Substance Use Topics  . Smoking status: Light Tobacco Smoker    Packs/day: 0.50    Years: 43.00    Types: Cigarettes    Last attempt to quit: 11/03/2012  . Smokeless tobacco: Never Used     Comment: decreased since may 2015  . Alcohol use No     Allergies   Naproxen; Aspirin; and Codeine   Review of Systems Review of Systems  Musculoskeletal: Positive for arthralgias and joint swelling.  Skin: Positive for color change (bruising).  Neurological: Negative for weakness and numbness.     Physical Exam Updated Vital Signs BP 158/67 Comment: pt takes BP medication   Pulse 82   Temp 99.1 F (37.3 C) (Oral)   Resp 15   SpO2 96%   Physical Exam  Constitutional: She is oriented to person, place, and time. She appears well-developed and well-nourished.  HENT:  Head: Normocephalic and atraumatic.  Cardiovascular: Normal rate.   Pulmonary/Chest: Effort normal.  Musculoskeletal: She exhibits tenderness. She exhibits no deformity.  RUE: Acutely tender to palpation. Bruising noted to distal wrist. No obvious deformity. Grip strength 5/5.  Sensation intact. Radial pulse 2+. No tenderness to palpation of the elbow or shoulder.   Neurological: She is alert and oriented to person, place, and time. She has normal strength. No sensory deficit.  Skin: Skin is warm and dry.  Psychiatric: She has a normal mood and affect.  Nursing note and vitals reviewed.    ED Treatments / Results  DIAGNOSTIC STUDIES: Oxygen Saturation is 96% on RA, adequate by my interpretation.    COORDINATION OF CARE: 5:04 PM Discussed treatment plan with pt at bedside and pt agreed to plan.   Labs (all labs ordered are listed, but  only abnormal results are displayed) Labs Reviewed - No data to display  EKG  EKG Interpretation None       Radiology Dg Wrist Complete Right  Result Date: 02/15/2017 CLINICAL DATA:  Right wrist injury due to a trip and fall yesterday. Bruising about the wrist and pain. Initial encounter. EXAM: RIGHT WRIST - COMPLETE 3+ VIEW COMPARISON:  None. FINDINGS: There is some soft tissue swelling about the wrist. No fracture or dislocation is identified. No notable arthropathy. 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 IMPRESSION: Soft tissue swelling without underlying acute bony or joint abnormality. Chondrocalcinosis. Electronically Signed   By: Drusilla Kannerhomas  Dalessio M.D.   On: 02/15/2017 17:33    Procedures Procedures (including critical care time)  Patient placed in Velcro wrist splint by nursing staff  Medications Ordered in ED Medications  oxyCODONE-acetaminophen (PERCOCET/ROXICET) 5-325 MG per tablet 1 tablet (1 tablet Oral Given 02/15/17 1729)     Initial Impression / Assessment and Plan / ED Course  I have reviewed the triage vital signs and the nursing notes.  Pertinent labs & imaging results that were available during my care of the patient were reviewed by me and considered in my medical decision making (see chart for details).      Final Clinical Impressions(s) /  ED Diagnoses   Final diagnoses:  Sprain of right wrist, initial encounter    61 year old female presents today with wrist sprain. She has no obvious deformity, has bruising around the wrist. She has no neurological deficits. She has no tenderness to palpation of the hand. She will be placed in a splint, encouraged follow-up with orthopedic  if symptoms continue persist. She is given strict return precautions, she verbalized understanding and agreement to today's plan had no further questions or concerns at time of discharge  New Prescriptions Discharge Medication List as of 02/15/2017  6:17 PM    START taking these medications   Details  oxyCODONE-acetaminophen (PERCOCET/ROXICET) 5-325 MG tablet Take 1 tablet by mouth every 4 (four) hours as needed for severe pain., Starting Mon 02/15/2017, Print       I personally performed the services described in this documentation, which was scribed in my presence. The recorded information has been reviewed and is accurate.   Eyvonne MechanicJeffrey Aveon Colquhoun, PA-C 02/15/17 2005    Lyndal Pulleyaniel Knott, MD 02/16/17 0247    Eyvonne MechanicJeffrey Damin Salido, PA-C 02/26/17 1753    Lyndal Pulleyaniel Knott, MD 02/27/17 [id number] [id number]

## 2017-02-15 NOTE — Discharge Instructions (Signed)
Please read attached information. If you experience any new or worsening signs or symptoms please return to the emergency room for evaluation. Please follow-up with your primary care provider or specialist as discussed. Please use medication prescribed only as directed and discontinue taking if you have any concerning signs or symptoms.   °

## 2017-02-15 NOTE — ED Notes (Signed)
Ortho tech at bedside 

## 2017-02-17 ENCOUNTER — Encounter: Payer: Self-pay | Admitting: Family Medicine

## 2017-03-10 ENCOUNTER — Telehealth: Payer: Self-pay | Admitting: Family Medicine

## 2017-03-10 NOTE — Telephone Encounter (Signed)
Patient called today requesting to be referred to:  Specialist:  [facility] Provider name/phone number:   Diagnosis (code):  Possible broken wrist Date of last OV:  09-11-16  Note routed to provider and Referral Coordinator [facility]([place]).  Will call patient back regarding referral status.  Shelly JarvisEmily C Frazier

## 2017-03-10 NOTE — Telephone Encounter (Signed)
I don't have a record of her broken wrist. Hence do not have a referral diagnosis. Please have her come in to be seen for evaluation and xray. Thanks.

## 2017-03-10 NOTE — Telephone Encounter (Signed)
Pt contacted- pt does not want to wait that long for a visit, "i am in a lot of pain." PCP does not have apts until next Tuesday. scheduled pt with same day provider for 3/15.

## 2017-03-11 ENCOUNTER — Ambulatory Visit (INDEPENDENT_AMBULATORY_CARE_PROVIDER_SITE_OTHER): Payer: Medicaid Other | Admitting: Family Medicine

## 2017-03-11 ENCOUNTER — Encounter: Payer: Self-pay | Admitting: Family Medicine

## 2017-03-11 ENCOUNTER — Telehealth: Payer: Self-pay | Admitting: *Deleted

## 2017-03-11 VITALS — BP 112/70 | HR 83 | Temp 97.9°F | Ht 65.0 in | Wt 181.4 lb

## 2017-03-11 DIAGNOSIS — S6990XA Unspecified injury of unspecified wrist, hand and finger(s), initial encounter: Secondary | ICD-10-CM | POA: Insufficient documentation

## 2017-03-11 DIAGNOSIS — S6991XA Unspecified injury of right wrist, hand and finger(s), initial encounter: Secondary | ICD-10-CM | POA: Diagnosis present

## 2017-03-11 MED ORDER — MELOXICAM 15 MG PO TABS: 15.0000 mg | ORAL_TABLET | Freq: Every day | ORAL | 0 refills | Status: DC

## 2017-03-11 NOTE — Patient Instructions (Signed)
I have referred you to orthopedics. Continue to wear your wrist brace and ice as needed. I prescribed meloxicam, while taking this once daily for the next 10 days, do not take ibuprofen, naproxen, Advil, or Aleve.  After the 10 day duration, you can take it once daily as needed for pain.

## 2017-03-11 NOTE — Telephone Encounter (Signed)
Mayer MaskerFYI. Gerson Fauth, CMA

## 2017-03-11 NOTE — Progress Notes (Signed)
Subjective: CC: Wrist pain HPI: Patient is a 61 y.o. female presenting to clinic today for a same-day appointment for wrist pain.  The patient injured her right wrist on 2/18 and was seen in the emergency room on the 19th. She tripped over a bunched up rug and fell on an outstretched arm.  At that time, she had an x-ray report the revealed soft tissue swelling without underlying acute bony or joint abnormality and chondrocalcinosis. She notes within a couple of her days of her ED visit,  her thumb turned blue/black.   At the ED, she was given a brace to wear, she wore this for 2 weeks. She states that there was no improvement therefore she called the orthopedist who the ED suggested,  but they stated she needed a referral from her PCP's office.   She notes the wrist pain is mostly in the radial aspect. The swelling comes and goes, she is not sure what makes it worse. She notes pain is at rest and worsened with any movement. Notes its a 9/10. No popping or clicking. No erythema or warmth.  She's been taking Ibuprofen 600mg  every 5-6hrs with some improvement in her symptoms. States Tylenol doesn't help. Notes Naproxen and Aleve cause GI upset.   Social History: former smoker   ROS: All other systems reviewed and are negative.  Past Medical History Patient Active Problem List   Diagnosis Date Noted  . Wrist injury 03/11/2017  . Nausea without vomiting 04/03/2016  . Neck pain 04/03/2016  . Memory loss 08/20/2015  . Loss of weight 08/20/2015  . Stress incontinence 08/31/2014  . Obesity, unspecified 08/10/2014  . Insomnia 04/13/2014  . Depression with anxiety 10/27/2013  . Smoker 09/29/2012  . COPD with bronchitis Gold B 09/28/2012  . GERD (gastroesophageal reflux disease) 03/29/2012  . Hyperlipidemia 03/09/2012  . Hypertension 03/08/2012    Medications- reviewed and updated Current Outpatient Prescriptions  Medication Sig Dispense Refill  . albuterol (PROVENTIL HFA;VENTOLIN HFA)  108 (90 Base) MCG/ACT inhaler Inhale 2 puffs into the lungs every 6 (six) hours as needed for wheezing. 2 Inhaler 3  . aspirin 81 MG tablet Take 81 mg by mouth daily.    Marland Kitchen atorvastatin (LIPITOR) 20 MG tablet Take 1 tablet (20 mg total) by mouth at bedtime. 90 tablet 1  . benzonatate (TESSALON) 100 MG capsule Take 2 capsules (200 mg total) by mouth 3 (three) times daily as needed for cough. 30 capsule 0  . carvedilol (COREG) 6.25 MG tablet Take 1 tablet (6.25 mg total) by mouth 2 (two) times daily with a meal. 180 tablet 1  . chlorpheniramine-HYDROcodone (TUSSIONEX PENNKINETIC ER) 10-8 MG/5ML SUER Take 5 mLs by mouth every 12 (twelve) hours as needed for cough. 115 mL 0  . dicyclomine (BENTYL) 20 MG tablet TAKE 1 TABLET TWICE A DAY 60 tablet 2  . hydrochlorothiazide (HYDRODIURIL) 25 MG tablet TAKE 1 TABLET BY MOUTH EVERY DAY 90 tablet 2  . ibuprofen (ADVIL,MOTRIN) 200 MG tablet Take 200 mg by mouth every 6 (six) hours as needed for mild pain.    . meloxicam (MOBIC) 15 MG tablet Take 1 tablet (15 mg total) by mouth daily. 30 tablet 0  . nitroGLYCERIN (NITROSTAT) 0.4 MG SL tablet Place 1 tablet (0.4 mg total) under the tongue every 5 (five) minutes as needed for chest pain. Do not exceed 3 doses. 12 tablet 1  . oxyCODONE-acetaminophen (PERCOCET/ROXICET) 5-325 MG tablet Take 1 tablet by mouth every 4 (four) hours as needed  for severe pain. 6 tablet 0  . pantoprazole (PROTONIX) 20 MG tablet Take 1 tablet (20 mg total) by mouth daily as needed. Use only as needed. Note prolonged use of medication can lead to kidney impairment among other side effect. 90 tablet 1  . promethazine (PHENERGAN) 25 MG tablet Take 1 tablet (25 mg total) by mouth every 8 (eight) hours as needed for nausea or vomiting. 20 tablet 0  . tiotropium (SPIRIVA) 18 MCG inhalation capsule Place 1 capsule (18 mcg total) into inhaler and inhale daily. 90 capsule 1  . venlafaxine XR (EFFEXOR-XR) 75 MG 24 hr capsule Take 1 capsule (75 mg total)  by mouth daily. 90 capsule 1   No current facility-administered medications for this visit.     Objective: Office vital signs reviewed. BP 112/70   Pulse 83   Temp 97.9 F (36.6 C) (Oral)   Ht 5\' 5"  (1.651 m)   Wt 181 lb 6.4 oz (82.3 kg)   SpO2 97%   BMI 30.19 kg/m    Physical Examination:  General: Awake, alert, well nourished, NAD Right wrist with mild diffuse swelling compared to left wrist. No ecchymoses.  Tenderness over the radial aspect. No tenderness over the hand/snuff box. Full range of motion, but notes pain with all movement. Slightly decreased strength on the right compared to left due to pain. Sensation intact distally. Good capillary refill.   Assessment/Plan: Wrist injury The patient is presenting with continued wrist pain 3 weeks after her injury. X-rays from the emergency room did not reveal a fracture. She does not have snuffbox tenderness. She is currently not in a brace. I'm curious if this is secondary to ligamentous injury versus an acute hairline fracture that did not show up on x-ray initially. No red flags on exam- patient with intact sensation and good capillary refill. -Referral to orthopedist -We'll start meloxicam 15 mg daily for 10 days and then daily as needed after that. -Discussed return precautions.   Orders Placed This Encounter  Procedures  . Ambulatory referral to Orthopedics    Referral Priority:   Routine    Referral Type:   Consultation    Referral Reason:   Specialty Services Required    Requested Specialty:   Orthopedic Surgery    Number of Visits Requested:   1    Meds ordered this encounter  Medications  . meloxicam (MOBIC) 15 MG tablet    Sig: Take 1 tablet (15 mg total) by mouth daily.    Dispense:  30 tablet    Refill:  0    Joanna Puffrystal S. Thermon Zulauf PGY-3, [facility]

## 2017-03-11 NOTE — Telephone Encounter (Signed)
Mrs. Shelly Frazier walked in this afternoon to request that her pending referral be with Dr. Merlyn LotKuzma, Teena IraniKevin R, Orthopedic Surgery,  that is close to her home. [name].

## 2017-03-11 NOTE — Assessment & Plan Note (Addendum)
The patient is presenting with continued wrist pain 3 weeks after her injury. X-rays from the emergency room did not reveal a fracture. She does not have snuffbox tenderness. She is currently not in a brace. I'm curious if this is secondary to ligamentous injury versus an acute hairline fracture that did not show up on x-ray initially. No red flags on exam- patient with intact sensation and good capillary refill. -Referral to orthopedist -We'll start meloxicam 15 mg daily for 10 days and then daily as needed after that. -Discussed return precautions.

## 2017-04-26 ENCOUNTER — Other Ambulatory Visit: Payer: Self-pay | Admitting: Family Medicine

## 2017-06-21 ENCOUNTER — Other Ambulatory Visit: Payer: Self-pay | Admitting: Family Medicine

## 2017-07-26 ENCOUNTER — Other Ambulatory Visit: Payer: Self-pay | Admitting: Family Medicine

## 2017-07-26 ENCOUNTER — Encounter: Payer: Self-pay | Admitting: Family Medicine

## 2017-07-30 ENCOUNTER — Other Ambulatory Visit: Payer: Self-pay | Admitting: Family Medicine

## 2017-08-20 ENCOUNTER — Other Ambulatory Visit: Payer: Self-pay | Admitting: Family Medicine

## 2017-08-21 ENCOUNTER — Other Ambulatory Visit: Payer: Self-pay | Admitting: Family Medicine

## 2017-09-08 ENCOUNTER — Other Ambulatory Visit: Payer: Self-pay | Admitting: Family Medicine

## 2017-09-21 ENCOUNTER — Other Ambulatory Visit: Payer: Self-pay | Admitting: Family Medicine

## 2017-09-21 MED ORDER — TIOTROPIUM BROMIDE MONOHYDRATE 18 MCG IN CAPS: 18.0000 ug | ORAL_CAPSULE | Freq: Every day | RESPIRATORY_TRACT | 1 refills | Status: DC

## 2017-10-13 ENCOUNTER — Other Ambulatory Visit: Payer: Self-pay | Admitting: Family Medicine

## 2018-01-04 ENCOUNTER — Other Ambulatory Visit: Payer: Self-pay | Admitting: Family Medicine

## 2018-01-09 ENCOUNTER — Other Ambulatory Visit: Payer: Self-pay | Admitting: Family Medicine

## 2018-01-12 ENCOUNTER — Ambulatory Visit: Payer: Medicaid Other | Admitting: Family Medicine

## 2018-01-12 ENCOUNTER — Other Ambulatory Visit: Payer: Self-pay

## 2018-01-12 ENCOUNTER — Encounter: Payer: Self-pay | Admitting: Family Medicine

## 2018-01-12 VITALS — BP 124/70 | HR 79 | Temp 98.6°F | Ht 65.0 in | Wt 167.0 lb

## 2018-01-12 DIAGNOSIS — Z23 Encounter for immunization: Secondary | ICD-10-CM

## 2018-01-12 DIAGNOSIS — F17200 Nicotine dependence, unspecified, uncomplicated: Secondary | ICD-10-CM

## 2018-01-12 DIAGNOSIS — J42 Unspecified chronic bronchitis: Secondary | ICD-10-CM

## 2018-01-12 DIAGNOSIS — G5622 Lesion of ulnar nerve, left upper limb: Secondary | ICD-10-CM | POA: Insufficient documentation

## 2018-01-12 HISTORY — DX: Lesion of ulnar nerve, left upper limb: G56.22

## 2018-01-12 MED ORDER — TIOTROPIUM BROMIDE MONOHYDRATE 18 MCG IN CAPS: 18.0000 ug | ORAL_CAPSULE | Freq: Every day | RESPIRATORY_TRACT | 3 refills | Status: DC

## 2018-01-12 MED ORDER — PROMETHAZINE HCL 25 MG PO TABS: 25.0000 mg | ORAL_TABLET | Freq: Three times a day (TID) | ORAL | 0 refills | Status: DC | PRN

## 2018-01-12 MED ORDER — VARENICLINE TARTRATE 0.5 MG PO TABS: 0.5000 mg | ORAL_TABLET | Freq: Two times a day (BID) | ORAL | 3 refills | Status: AC

## 2018-01-12 MED ORDER — WRIST BRACE MISC: 1.0000 | Freq: Every day | 0 refills | Status: DC

## 2018-01-12 NOTE — Progress Notes (Signed)
Subjective:  Shelly Frazier is a 63 y.o. female who presents to the [facility] today with a chief complaint of cough.   HPI:  Cough Patient has history of COPD.  She has had a cough and wheeze for 2 weeks. Wheezing Getting worse, cough getting better.  She is coughing up sputum. She also endorses having sob after coughing spell.  She has been using albuterol inhaler everyday every 4 hours for the past 2 weeks.  She has not used spiriva over 1 year.  She denies denies any fevers or chills.  Pt having nausea after coughing.  She has has used phenergan in the past for this. Denies chest pain  Smoking She says she has been smoking for most of her life. Smoking 2-3 cigarrettes a day. She says wants to quit, but feels like she is "being deprived" smoking her hole life, she says she lots of stress.  Had several dealths in past 6 months.  She is interested in using Chantix, but is scared of nausea.  History of left hand numbness Gradual onset numbness along palmar surface of pinky and ring finger.  No history of trauma to the area.  Denies any weakness in hands or trouble with coordination.  ROS: Per HPI  PMH: Smoking history reviewed.    Objective:  Physical Exam: BP 124/70   Pulse 79   Temp 98.6 F (37 C) (Oral)   Ht 5\' 5"  (1.651 m)   Wt 167 lb (75.8 kg)   SpO2 97%   BMI 27.79 kg/m   Gen: NAD, resting comfortably CV: RRR with no murmurs appreciated Pulm: NWOB, CTAB with no crackles, wheezes, or rhonchi GI: Normal bowel sounds present. Soft, Nontender, Nondistended. MSK: no edema, cyanosis, or clubbing noted Skin: warm, dry Neuro: Extraocular motions intact, pupils equal round reactive light, CN II through XII grossly normal, 5/5 strength in upper extremities bilaterally with exception of decreased interosseous muscles of the left hand, decreased sensation along palmar left hand along the ulnar distribution Psych: Normal affect and thought content  No results found for this or any  previous visit (from the past 72 hour(s)).   Assessment/Plan:  COPD (chronic obstructive pulmonary disease) (HCC) Patient has increased risk of rescue inhaler with out much benefit of shortness of breath.  Does not appear to be in acute exacerbation.  Normal work of breathing and normal oxygen saturation at rest.  Will restart on Spiriva.  Smoker Patient smokes to 3 cigarettes a day.  She is wanting to quit.  Will trial Chantix.  Concerned about nausea.  Will prescribe Phenergan for her as this is helpful in the past.  Discussed risk of 6 date of side effects of Phenergan and patient acknowledged that she will not operate heavy equipment while using.  Will follow up smoking cessation in 2 weeks  Ulnar tunnel syndrome of left wrist Patient has left numbness without any muscle wasting in the ulnar distribution.  Likely due to ulnar tunnel syndrome.  Recommended wrist brace trial for 2 weeks and will reassess on follow-up.   Lab Orders  No laboratory test(s) ordered today    Meds ordered this encounter  Medications  . tiotropium (SPIRIVA) 18 MCG inhalation capsule    Sig: Place 1 capsule (18 mcg total) into inhaler and inhale daily.    Dispense:  90 capsule    Refill:  3    Order Specific Question:   Lot Number?    Answer:   [id number]  Comments:   x2 samples    Order Specific Question:   Expiration Date?    Answer:   10/28/2013    Comments:   x 2 samples    Order Specific Question:   Manufacturer?    Answer:   [employer] [[id number]]    Order Specific Question:   Quantity    Answer:   20    Comments:   capsules - 2 boxes of 10 capsules  . promethazine (PHENERGAN) 25 MG tablet    Sig: Take 1 tablet (25 mg total) by mouth every 8 (eight) hours as needed for nausea or vomiting.    Dispense:  30 tablet    Refill:  0  . varenicline (CHANTIX) 0.5 MG tablet    Sig: Take 1 tablet (0.5 mg total) by mouth 2 (two) times daily.    Dispense:  60 tablet    Refill:  3  . Misc. Devices (WRIST BRACE)  MISC    Sig: 1 Device by Does not apply route daily. Wear on left hand as needed for wrist discomfort    Dispense:  1 each    Refill:  0    Thomes DinningBrad Jeramine Delis, MD, MS FAMILY MEDICINE RESIDENT - PGY1 01/12/2018 3:52 PM

## 2018-01-12 NOTE — Assessment & Plan Note (Signed)
Patient smokes to 3 cigarettes a day.  She is wanting to quit.  Will trial Chantix.  Concerned about nausea.  Will prescribe Phenergan for her as this is helpful in the past.  Discussed risk of 6 date of side effects of Phenergan and patient acknowledged that she will not operate heavy equipment while using.  Will follow up smoking cessation in 2 weeks

## 2018-01-12 NOTE — Assessment & Plan Note (Signed)
Patient has left numbness without any muscle wasting in the ulnar distribution.  Likely due to ulnar tunnel syndrome.  Recommended wrist brace trial for 2 weeks and will reassess on follow-up.

## 2018-01-12 NOTE — Assessment & Plan Note (Signed)
Patient has increased risk of rescue inhaler with out much benefit of shortness of breath.  Does not appear to be in acute exacerbation.  Normal work of breathing and normal oxygen saturation at rest.  Will restart on Spiriva.

## 2018-01-12 NOTE — Patient Instructions (Addendum)
It was a pleasure to see you today! Thank you for choosing [facility] for your primary care. Shelly Frazier was seen for cough.  It is likely due to your worsening COPD.  As discussed this will get better as you quit smoking and use your Spiriva daily. Come back to the clinic in 2 weeks to assess how your breathing is getting.  Come back to clinic sooner if your cough is getting worse or not getting better,  go to the emergency room if you develop extreme shortness of breath, chest pain, fever.   Meds ordered this encounter  Medications  . tiotropium (SPIRIVA) 18 MCG inhalation capsule    Sig: Place 1 capsule (18 mcg total) into inhaler and inhale daily.    Dispense:  90 capsule    Refill:  3    Order Specific Question:   Lot Number?    Answer:   [id number]:   [id number] B    Comments:   x2 samples    Order Specific Question:   Expiration Date?    Answer:   10/28/2013    Comments:   x 2 samples    Order Specific Question:   Manufacturer?    Answer:   [employer] [[id number]]    Order Specific Question:   Quantity    Answer:   20    Comments:   capsules - 2 boxes of 10 capsules  . promethazine (PHENERGAN) 25 MG tablet    Sig: Take 1 tablet (25 mg total) by mouth every 8 (eight) hours as needed for nausea or vomiting.    Dispense:  30 tablet    Refill:  0  . varenicline (CHANTIX) 0.5 MG tablet    Sig: Take 1 tablet (0.5 mg total) by mouth 2 (two) times daily.    Dispense:  60 tablet    Refill:  3  . Misc. Devices (WRIST BRACE) MISC    Sig: 1 Device by Does not apply route daily. Wear on left hand as needed for wrist discomfort    Dispense:  1 each    Refill:  0    Best,  Thomes DinningBrad Thompson, MD, MS FAMILY MEDICINE RESIDENT - PGY1 01/12/2018 2:08 PM

## 2018-01-25 ENCOUNTER — Ambulatory Visit: Payer: Medicaid Other | Admitting: Family Medicine

## 2018-02-20 ENCOUNTER — Other Ambulatory Visit: Payer: Self-pay | Admitting: Family Medicine

## 2018-02-21 NOTE — Telephone Encounter (Signed)
Had venlafaxine rx in August, #90 w 1 refill. Wants refill now so I gave her  1 m and a refill--enough time for her to get f/u appt. Note on rx to pharmacy needing appt.

## 2018-02-22 ENCOUNTER — Other Ambulatory Visit: Payer: Self-pay | Admitting: Family Medicine

## 2018-03-06 ENCOUNTER — Other Ambulatory Visit: Payer: Self-pay | Admitting: Family Medicine

## 2018-03-12 ENCOUNTER — Other Ambulatory Visit: Payer: Self-pay | Admitting: Family Medicine

## 2018-03-29 ENCOUNTER — Other Ambulatory Visit: Payer: Self-pay | Admitting: Family Medicine

## 2018-03-29 DIAGNOSIS — Z1231 Encounter for screening mammogram for malignant neoplasm of breast: Secondary | ICD-10-CM

## 2018-04-14 ENCOUNTER — Other Ambulatory Visit: Payer: Self-pay | Admitting: Family Medicine

## 2018-04-15 ENCOUNTER — Ambulatory Visit: Payer: Medicaid Other

## 2018-04-20 ENCOUNTER — Encounter: Payer: Self-pay | Admitting: *Deleted

## 2018-04-20 NOTE — Telephone Encounter (Signed)
lmovm for pt to return call.  I then realized that pt has active mychart.  Sent message via mychart. Fleeger, Maryjo RochesterJessica Dawn, CMA

## 2018-04-20 NOTE — Telephone Encounter (Signed)
Please ask patient to schedule follow up with Dr. Eniola when she returns in mid-May. Thanks, Tranquilino Fischler J Amsi Grimley, MD  

## 2018-05-09 ENCOUNTER — Other Ambulatory Visit: Payer: Self-pay | Admitting: Family Medicine

## 2018-05-22 ENCOUNTER — Other Ambulatory Visit: Payer: Self-pay | Admitting: Family Medicine

## 2018-05-23 ENCOUNTER — Encounter: Payer: Self-pay | Admitting: Family Medicine

## 2018-05-23 ENCOUNTER — Other Ambulatory Visit: Payer: Self-pay | Admitting: Family Medicine

## 2018-05-24 ENCOUNTER — Encounter: Payer: Self-pay | Admitting: Family Medicine

## 2018-05-26 ENCOUNTER — Other Ambulatory Visit: Payer: Self-pay | Admitting: Family Medicine

## 2018-05-31 ENCOUNTER — Other Ambulatory Visit: Payer: Self-pay

## 2018-05-31 ENCOUNTER — Ambulatory Visit (INDEPENDENT_AMBULATORY_CARE_PROVIDER_SITE_OTHER): Payer: Medicaid Other | Admitting: Family Medicine

## 2018-05-31 ENCOUNTER — Encounter: Payer: Self-pay | Admitting: Family Medicine

## 2018-05-31 VITALS — BP 118/62 | HR 89 | Temp 98.3°F | Wt 173.0 lb

## 2018-05-31 DIAGNOSIS — Z1231 Encounter for screening mammogram for malignant neoplasm of breast: Secondary | ICD-10-CM

## 2018-05-31 DIAGNOSIS — F17200 Nicotine dependence, unspecified, uncomplicated: Secondary | ICD-10-CM

## 2018-05-31 DIAGNOSIS — E785 Hyperlipidemia, unspecified: Secondary | ICD-10-CM

## 2018-05-31 DIAGNOSIS — Z1239 Encounter for other screening for malignant neoplasm of breast: Secondary | ICD-10-CM

## 2018-05-31 DIAGNOSIS — F418 Other specified anxiety disorders: Secondary | ICD-10-CM

## 2018-05-31 DIAGNOSIS — J302 Other seasonal allergic rhinitis: Secondary | ICD-10-CM

## 2018-05-31 DIAGNOSIS — I10 Essential (primary) hypertension: Secondary | ICD-10-CM | POA: Diagnosis not present

## 2018-05-31 DIAGNOSIS — J42 Unspecified chronic bronchitis: Secondary | ICD-10-CM

## 2018-05-31 MED ORDER — PROMETHAZINE HCL 25 MG PO TABS: 25.0000 mg | ORAL_TABLET | Freq: Three times a day (TID) | ORAL | 0 refills | Status: DC | PRN

## 2018-05-31 NOTE — Assessment & Plan Note (Signed)
Poor compliance with her Shelly PayorSPiriva and yet she is asymptomatic. I encouraged her to use regularly. If she remains stable we will gradually take her off it. Use albuterol as needed.

## 2018-05-31 NOTE — Assessment & Plan Note (Signed)
Stable on effexor. Continue same.  

## 2018-05-31 NOTE — Assessment & Plan Note (Signed)
BP looks good. Bmet checked. I will call with result.

## 2018-05-31 NOTE — Assessment & Plan Note (Signed)
May use OTC Zyrtec daily. Refilled phenergan prn Nausea.

## 2018-05-31 NOTE — Assessment & Plan Note (Signed)
Counseling done. She will continue to cut back gradually.

## 2018-05-31 NOTE — Patient Instructions (Signed)
Mammogram A mammogram is an X-ray of the breasts that is done to check for changes that are not normal. This test can screen for and find any changes that may suggest breast cancer. This test can also help to find other changes and variations in the breast. What happens before the procedure?  Have this test done about 1-2 weeks after your period. This is usually when your breasts are the least tender.  If you are visiting a new doctor or clinic, send any past mammogram images to your new doctor's office.  Wash your breasts and under your arms the day of the test.  Do not use deodorants, perfumes, lotions, or powders on the day of the test.  Take off any jewelry from your neck.  Wear clothes that you can change into and out of easily. What happens during the procedure?  You will undress from the waist up. You will put on a gown.  You will stand in front of the X-ray machine.  Each breast will be placed between two plastic or glass plates. The plates will press down on your breast for a few seconds. Try to stay as relaxed as possible. This does not cause any harm to your breasts. Any discomfort you feel will be very brief.  X-rays will be taken from different angles of each breast. The procedure may vary among doctors and hospitals. What happens after the procedure?  The mammogram will be looked at by a specialist (radiologist).  You may need to do certain parts of the test again. This depends on the quality of the images.  Ask when your test results will be ready. Make sure you get your test results.  You may go back to your normal activities. This information is not intended to replace advice given to you by your health care provider. Make sure you discuss any questions you have with your health care provider. Document Released: 03/12/2009 Document Revised: 05/21/2016 Document Reviewed: 02/22/2015 Elsevier Interactive Patient Education  2018 Elsevier Inc.  

## 2018-05-31 NOTE — Progress Notes (Signed)
Subjective:     Patient ID: Shelly Frazier, female   DOB: 09/25/1956, 62 y.o.   MRN: [id number]  HPI COPD/Smoking: Denies any SOB or cough. Uses albuterol as needed. She does not use her Spiriva regularly. She stated she forgets. She is down to 2 sticks of cigarette daily or less.  HTN/HLD: Compliant with meds, here for f/u. Depression: Denies any new symptoms. SHe had lost of family issues in the last few months which made her symptoms worse. However, things are back to normal since the last few weeks. Allergy:Pollens makes her sick with nausea, no vomiting. She will like to get a refill of her Phenergan. HM: Here for routine health care.  Current Outpatient Medications on File Prior to Visit  Medication Sig Dispense Refill  . aspirin 81 MG tablet Take 81 mg by mouth daily.    Marland Kitchen. atorvastatin (LIPITOR) 20 MG tablet TAKE 1 TABLET (20 MG TOTAL) BY MOUTH AT BEDTIME. 90 tablet 3  . benzonatate (TESSALON) 100 MG capsule Take 2 capsules (200 mg total) by mouth 3 (three) times daily as needed for cough. 30 capsule 0  . carvedilol (COREG) 6.25 MG tablet TAKE 1 TABLET (6.25 MG TOTAL) BY MOUTH 2 (TWO) TIMES DAILY WITH A MEAL. 180 tablet 0  . chlorpheniramine-HYDROcodone (TUSSIONEX PENNKINETIC ER) 10-8 MG/5ML SUER Take 5 mLs by mouth every 12 (twelve) hours as needed for cough. 115 mL 0  . dicyclomine (BENTYL) 20 MG tablet TAKE 1 TABLET BY MOUTH TWICE A DAY 60 tablet 2  . hydrochlorothiazide (HYDRODIURIL) 25 MG tablet TAKE 1 TABLET BY MOUTH EVERY DAY 90 tablet 1  . ibuprofen (ADVIL,MOTRIN) 200 MG tablet Take 200 mg by mouth every 6 (six) hours as needed for mild pain.    . meloxicam (MOBIC) 15 MG tablet Take 1 tablet (15 mg total) by mouth daily. 30 tablet 0  . Misc. Devices (WRIST BRACE) MISC 1 Device by Does not apply route daily. Wear on left hand as needed for wrist discomfort 1 each 0  . nitroGLYCERIN (NITROSTAT) 0.4 MG SL tablet Place 1 tablet (0.4 mg total) under the tongue every 5 (five) minutes  as needed for chest pain. Do not exceed 3 doses. 12 tablet 1  . oxyCODONE-acetaminophen (PERCOCET/ROXICET) 5-325 MG tablet Take 1 tablet by mouth every 4 (four) hours as needed for severe pain. 6 tablet 0  . pantoprazole (PROTONIX) 20 MG tablet Take 1 tablet (20 mg total) by mouth daily as needed. Use only as needed. Note prolonged use of medication can lead to kidney impairment among other side effect. 90 tablet 1  . PROAIR HFA 108 (90 Base) MCG/ACT inhaler TAKE 2 PUFFS BY MOUTH EVERY 6 HOURS AS NEEDED FOR WHEEZE 8.5 Inhaler 2  . promethazine (PHENERGAN) 25 MG tablet Take 1 tablet (25 mg total) by mouth every 8 (eight) hours as needed for nausea or vomiting. 30 tablet 0  . tiotropium (SPIRIVA) 18 MCG inhalation capsule Place 1 capsule (18 mcg total) into inhaler and inhale daily. 90 capsule 3  . venlafaxine XR (EFFEXOR-XR) 75 MG 24 hr capsule TAKE 1 CAPSULE BY MOUTH EVERY DAY 90 capsule 1   No current facility-administered medications on file prior to visit.    Past Medical History:  Diagnosis Date  . Anxiety   . Bronchitis   . Chest pain   . Elevated fasting glucose   . GERD (gastroesophageal reflux disease)   . Hyperlipemia   . Hypertension   . Obesity   . Tobacco  abuse    Vitals:   05/31/18 0942  BP: 118/62  Pulse: 89  Temp: 98.3 F (36.8 C)  TempSrc: Oral  SpO2: 97%  Weight: 173 lb (78.5 kg)     Review of Systems  Respiratory: Negative.   Cardiovascular: Negative.   Gastrointestinal: Negative.   Neurological: Negative.   Psychiatric/Behavioral: Negative.   All other systems reviewed and are negative.      Objective:   Physical Exam  Constitutional: She is oriented to person, place, and time. She appears well-developed. No distress.  Cardiovascular: Normal rate, regular rhythm and normal heart sounds.  No murmur heard. Pulmonary/Chest: Effort normal and breath sounds normal. No respiratory distress. She has no wheezes.  Abdominal: Soft. Bowel sounds are normal.  She exhibits no distension and no mass. There is no tenderness.  Musculoskeletal: Normal range of motion. She exhibits no edema.  Neurological: She is alert and oriented to person, place, and time. No cranial nerve deficit.  Psychiatric: She has a normal mood and affect. Her behavior is normal. Judgment and thought content normal.  Nursing note and vitals reviewed.      Assessment:     COPD Smoking HTN Hyperlipidemia Seasonal allergy Health maintenance.    Plan:     Check problem list.  For her health maintenance, she will contact breast imaging center to schedule mammogram. I also placed an order. She confirmed she had total hysterectomy done. I recommended pelvic exam in the future to ensure that her cervix was removed. She will schedule. Otherwise, PAP is not warranted at this time.

## 2018-05-31 NOTE — Assessment & Plan Note (Signed)
FLP checked. 

## 2018-06-01 ENCOUNTER — Telehealth: Payer: Self-pay | Admitting: Family Medicine

## 2018-06-01 LAB — BASIC METABOLIC PANEL
BUN / CREAT RATIO: 18 (ref 12–28)
BUN: 15 mg/dL (ref 8–27)
CO2: 24 mmol/L (ref 20–29)
CREATININE: 0.83 mg/dL (ref 0.57–1.00)
Calcium: 9.3 mg/dL (ref 8.7–10.3)
Chloride: 99 mmol/L (ref 96–106)
GFR calc Af Amer: 87 mL/min/{1.73_m2} (ref >59)
GFR calc non Af Amer: 75 mL/min/{1.73_m2} (ref >59)
GLUCOSE: 117 mg/dL — AB (ref 65–99)
Potassium: 3 mmol/L — ABNORMAL LOW (ref 3.5–5.2)
SODIUM: 142 mmol/L (ref 134–144)

## 2018-06-01 LAB — LIPID PANEL
Chol/HDL Ratio: 2.9 ratio (ref 0.0–4.4)
Cholesterol, Total: 157 mg/dL (ref 100–199)
HDL: 55 mg/dL (ref >39)
LDL CALC: 73 mg/dL (ref 0–99)
Triglycerides: 146 mg/dL (ref 0–149)
VLDL CHOLESTEROL CAL: 29 mg/dL (ref 5–40)

## 2018-06-01 MED ORDER — POTASSIUM CHLORIDE CRYS ER 10 MEQ PO TBCR: 10.0000 meq | EXTENDED_RELEASE_TABLET | Freq: Every day | ORAL | 0 refills | Status: DC

## 2018-06-01 NOTE — Telephone Encounter (Signed)
HIPPA compliant call back message left.   Please advise patient when she calls back:  Potassium is slightly low and this is likely due to the water pills she is on for her blood pressure. I will like her to start potassium medication which I escribed to her pharmacy.  F/U in 2 weeks to recheck her potassium level.  Cholesterol test is normal.  Kidney test is normal.

## 2018-06-01 NOTE — Telephone Encounter (Signed)
Patient left message returning call.  Call back is [phone]  Ples SpecterAlisa Brake, RN [facility] RN)

## 2018-06-01 NOTE — Telephone Encounter (Signed)
Patient aware and will make a lab appt in 2 weeks. Shelly Frazier,CMA

## 2018-06-11 ENCOUNTER — Other Ambulatory Visit: Payer: Self-pay | Admitting: Family Medicine

## 2018-06-28 ENCOUNTER — Other Ambulatory Visit: Payer: Self-pay | Admitting: Family Medicine

## 2018-06-28 NOTE — Telephone Encounter (Signed)
Help schedule follow-up for hypokalemia. Need repeat lab work on potassium supplement.

## 2018-06-28 NOTE — Telephone Encounter (Signed)
LVM on pts phone informing to call the office to schedule with pcp at her earliest convenience. Please assist her in scheduling with Eniola.

## 2018-07-04 ENCOUNTER — Encounter: Payer: Self-pay | Admitting: Family Medicine

## 2018-07-14 ENCOUNTER — Encounter: Payer: Self-pay | Admitting: Family Medicine

## 2018-07-15 ENCOUNTER — Encounter: Payer: Self-pay | Admitting: Family Medicine

## 2018-07-15 ENCOUNTER — Other Ambulatory Visit: Payer: Self-pay

## 2018-07-15 ENCOUNTER — Ambulatory Visit (INDEPENDENT_AMBULATORY_CARE_PROVIDER_SITE_OTHER): Payer: Medicaid Other | Admitting: Family Medicine

## 2018-07-15 VITALS — BP 128/63 | HR 75 | Temp 98.3°F | Wt 171.0 lb

## 2018-07-15 DIAGNOSIS — Z1239 Encounter for other screening for malignant neoplasm of breast: Secondary | ICD-10-CM

## 2018-07-15 DIAGNOSIS — Z1231 Encounter for screening mammogram for malignant neoplasm of breast: Secondary | ICD-10-CM | POA: Diagnosis not present

## 2018-07-15 DIAGNOSIS — E876 Hypokalemia: Secondary | ICD-10-CM | POA: Diagnosis present

## 2018-07-15 NOTE — Progress Notes (Addendum)
Subjective:     Patient ID: Shelly Frazier, female   DOB: 24-May-1956, 62 y.o.   MRN: [id number]  HPI Hypokalemia: Here for follow-up. She stopped her Kdur because it gave her stomach upset. Denies any other concern.  Current Outpatient Medications on File Prior to Visit  Medication Sig Dispense Refill  . aspirin 81 MG tablet Take 81 mg by mouth daily.    Marland Kitchen atorvastatin (LIPITOR) 20 MG tablet TAKE 1 TABLET (20 MG TOTAL) BY MOUTH AT BEDTIME. 90 tablet 3  . carvedilol (COREG) 6.25 MG tablet TAKE 1 TABLET (6.25 MG TOTAL) BY MOUTH 2 (TWO) TIMES DAILY WITH A MEAL. 180 tablet 1  . dicyclomine (BENTYL) 20 MG tablet TAKE 1 TABLET BY MOUTH TWICE A DAY 60 tablet 2  . hydrochlorothiazide (HYDRODIURIL) 25 MG tablet TAKE 1 TABLET BY MOUTH EVERY DAY 90 tablet 1  . ibuprofen (ADVIL,MOTRIN) 200 MG tablet Take 200 mg by mouth every 6 (six) hours as needed for mild pain.    Marland Kitchen KLOR-CON M10 10 MEQ tablet TAKE 1 TABLET BY MOUTH EVERY DAY 30 tablet 0  . Misc. Devices (WRIST BRACE) MISC 1 Device by Does not apply route daily. Wear on left hand as needed for wrist discomfort 1 each 0  . nitroGLYCERIN (NITROSTAT) 0.4 MG SL tablet Place 1 tablet (0.4 mg total) under the tongue every 5 (five) minutes as needed for chest pain. Do not exceed 3 doses. (Patient not taking: Reported on 05/31/2018) 12 tablet 1  . PROAIR HFA 108 (90 Base) MCG/ACT inhaler TAKE 2 PUFFS BY MOUTH EVERY 6 HOURS AS NEEDED FOR WHEEZE (Patient not taking: Reported on 05/31/2018) 8.5 Inhaler 2  . promethazine (PHENERGAN) 25 MG tablet Take 1 tablet (25 mg total) by mouth every 8 (eight) hours as needed for nausea or vomiting. 30 tablet 0  . tiotropium (SPIRIVA) 18 MCG inhalation capsule Place 1 capsule (18 mcg total) into inhaler and inhale daily. (Patient not taking: Reported on 05/31/2018) 90 capsule 3  . venlafaxine XR (EFFEXOR-XR) 75 MG 24 hr capsule TAKE 1 CAPSULE BY MOUTH EVERY DAY 90 capsule 1   No current facility-administered medications on file prior  to visit.    Past Medical History:  Diagnosis Date  . Anxiety   . Bronchitis   . Chest pain   . Elevated fasting glucose   . GERD (gastroesophageal reflux disease)   . Hyperlipemia   . Hypertension   . Nausea without vomiting 04/03/2016  . Neck pain 04/03/2016  . Obesity   . Tobacco abuse    Vitals:   07/15/18 0843  BP: 128/63  Pulse: 75  Temp: 98.3 F (36.8 C)  TempSrc: Oral  SpO2: 98%  Weight: 171 lb (77.6 kg)     Review of Systems  Respiratory: Negative.   Cardiovascular: Negative.   Gastrointestinal: Negative.   Genitourinary: Negative.   All other systems reviewed and are negative.      Objective:   Physical Exam  Constitutional: No distress.  Cardiovascular: Normal rate, regular rhythm, normal heart sounds and intact distal pulses.  No murmur heard. Pulmonary/Chest: Effort normal and breath sounds normal. No stridor. No respiratory distress. She has no wheezes.  Abdominal: Soft. Bowel sounds are normal. She exhibits no distension and no mass. There is no tenderness. There is no rebound and no guarding. No hernia.  Nursing note and vitals reviewed.      Assessment:     Hypokalemia    Plan:     Check problem  list     Mammogram slip given and ordered.

## 2018-07-15 NOTE — Patient Instructions (Signed)

## 2018-07-15 NOTE — Assessment & Plan Note (Signed)
Recheck level. Bmet ordered. I will contact her with result. Consider switching HCTZ to another BP regimen if K+ remains low or worsens. She agreed with the plan.

## 2018-07-15 NOTE — Addendum Note (Signed)
Addended by: Janit PaganENIOLA, Benjiman Sedgwick T on: 07/15/2018 11:01 AM   Modules accepted: Orders

## 2018-07-16 LAB — BASIC METABOLIC PANEL
BUN / CREAT RATIO: 14 (ref 12–28)
BUN: 12 mg/dL (ref 8–27)
CO2: 27 mmol/L (ref 20–29)
Calcium: 9.7 mg/dL (ref 8.7–10.3)
Chloride: 97 mmol/L (ref 96–106)
Creatinine, Ser: 0.85 mg/dL (ref 0.57–1.00)
GFR calc non Af Amer: 73 mL/min/{1.73_m2} (ref >59)
GFR, EST AFRICAN AMERICAN: 84 mL/min/{1.73_m2} (ref >59)
Glucose: 103 mg/dL — ABNORMAL HIGH (ref 65–99)
Potassium: 3.4 mmol/L — ABNORMAL LOW (ref 3.5–5.2)
Sodium: 141 mmol/L (ref 134–144)

## 2018-07-18 ENCOUNTER — Telehealth: Payer: Self-pay | Admitting: Family Medicine

## 2018-07-18 NOTE — Telephone Encounter (Signed)
Test result discussed with her. K+ improved a bit. I recommended potassium rich diet. Recheck potassium in 4 weeks. If low, I will encourage Kdur vs switching HCTZ to a different BP regimen. She agreed with the plan.

## 2018-07-18 NOTE — Telephone Encounter (Signed)
I received an approved prior-auth letter for MRI breast from [facility].  I did not order or request for MRI breast. Patient is unaware as well when I called her.  [facility] called and [facility] staff who stated that [facility] by an unknown person, although there is no other they can find in their system.  She stated that patient canceled her Mammogram appointment in April and had not called back to rescheduled. I requested that GI center contact her today to schedule mammogram, the lady I spoke with agreed to contact patient to schedule mammogram today. I will f/u with the result.

## 2018-08-19 ENCOUNTER — Other Ambulatory Visit: Payer: Self-pay | Admitting: Family Medicine

## 2018-08-22 ENCOUNTER — Encounter: Payer: Self-pay | Admitting: Family Medicine

## 2018-08-22 ENCOUNTER — Other Ambulatory Visit: Payer: Self-pay | Admitting: Family Medicine

## 2018-08-22 MED ORDER — DICYCLOMINE HCL 20 MG PO TABS: 20.0000 mg | ORAL_TABLET | Freq: Two times a day (BID) | ORAL | 2 refills | Status: DC

## 2018-09-07 ENCOUNTER — Other Ambulatory Visit: Payer: Self-pay | Admitting: Family Medicine

## 2018-11-09 ENCOUNTER — Other Ambulatory Visit: Payer: Self-pay | Admitting: Family Medicine

## 2018-11-12 ENCOUNTER — Other Ambulatory Visit: Payer: Self-pay | Admitting: Family Medicine

## 2018-12-04 ENCOUNTER — Emergency Department (HOSPITAL_COMMUNITY)
Admission: EM | Admit: 2018-12-04 | Discharge: 2018-12-04 | Disposition: A | Discharge status: Home / Self Care | Payer: Medicaid Other | Attending: Emergency Medicine | Admitting: Emergency Medicine

## 2018-12-04 ENCOUNTER — Encounter (HOSPITAL_COMMUNITY): Payer: Self-pay | Admitting: Emergency Medicine

## 2018-12-04 ENCOUNTER — Emergency Department (HOSPITAL_COMMUNITY): Payer: Medicaid Other

## 2018-12-04 DIAGNOSIS — I10 Essential (primary) hypertension: Secondary | ICD-10-CM | POA: Diagnosis not present

## 2018-12-04 DIAGNOSIS — Z79899 Other long term (current) drug therapy: Secondary | ICD-10-CM | POA: Diagnosis not present

## 2018-12-04 DIAGNOSIS — Z7982 Long term (current) use of aspirin: Secondary | ICD-10-CM | POA: Insufficient documentation

## 2018-12-04 DIAGNOSIS — F17210 Nicotine dependence, cigarettes, uncomplicated: Secondary | ICD-10-CM | POA: Diagnosis not present

## 2018-12-04 DIAGNOSIS — J441 Chronic obstructive pulmonary disease with (acute) exacerbation: Secondary | ICD-10-CM | POA: Insufficient documentation

## 2018-12-04 DIAGNOSIS — R05 Cough: Secondary | ICD-10-CM | POA: Diagnosis present

## 2018-12-04 HISTORY — DX: Chronic obstructive pulmonary disease, unspecified: J44.9

## 2018-12-04 MED ORDER — AZITHROMYCIN 250 MG PO TABS
500.0000 mg | ORAL_TABLET | Freq: Once | ORAL | Status: AC
  Administered 2018-12-04: 500 mg via ORAL
  Filled 2018-12-04: qty 2

## 2018-12-04 MED ORDER — PREDNISONE 20 MG PO TABS: ORAL_TABLET | ORAL | 0 refills | Status: DC

## 2018-12-04 MED ORDER — ACETAMINOPHEN 500 MG PO TABS
1000.0000 mg | ORAL_TABLET | Freq: Once | ORAL | Status: AC
  Administered 2018-12-04: 1000 mg via ORAL
  Filled 2018-12-04: qty 2

## 2018-12-04 MED ORDER — ALBUTEROL SULFATE HFA 108 (90 BASE) MCG/ACT IN AERS
2.0000 | INHALATION_SPRAY | RESPIRATORY_TRACT | Status: DC
  Administered 2018-12-04: 2 via RESPIRATORY_TRACT
  Filled 2018-12-04: qty 6.7

## 2018-12-04 MED ORDER — HYDROCODONE-HOMATROPINE 5-1.5 MG/5ML PO SYRP: 5.0000 mL | ORAL_SOLUTION | Freq: Four times a day (QID) | ORAL | 0 refills | Status: DC | PRN

## 2018-12-04 MED ORDER — PREDNISONE 20 MG PO TABS
60.0000 mg | ORAL_TABLET | Freq: Once | ORAL | Status: AC
  Administered 2018-12-04: 60 mg via ORAL
  Filled 2018-12-04: qty 3

## 2018-12-04 MED ORDER — IPRATROPIUM-ALBUTEROL 0.5-2.5 (3) MG/3ML IN SOLN
3.0000 mL | Freq: Once | RESPIRATORY_TRACT | Status: AC
  Administered 2018-12-04: 3 mL via RESPIRATORY_TRACT
  Filled 2018-12-04: qty 3

## 2018-12-04 MED ORDER — AZITHROMYCIN 250 MG PO TABS: 250.0000 mg | ORAL_TABLET | Freq: Every day | ORAL | 0 refills | Status: AC

## 2018-12-04 NOTE — ED Triage Notes (Signed)
Pt reports that she had bronchitis for over week and coughing up green phlegm and sore throat and headache. reports fatigued bc cant sleep due to cough. Reports will ended up choking in a coughing fit. Pt reports has COPD and thinks needs an antibiotic to clear up symptoms.

## 2018-12-04 NOTE — ED Notes (Signed)
Patient maintained O2 saturation above 94% while ambulating.

## 2018-12-04 NOTE — Discharge Instructions (Addendum)
1.  Start your Zithromax tomorrow.  You were given your first dose in the emergency department.  You will have 4 more days after today. 2.  Start your prednisone tomorrow.  Your first dose was given in the emergency department. 3.  Use the albuterol inhaler every 4-6 hours until you have improved.  Then use as needed every 4-6 hours. 4.  Use Hycodan for severe cough and nighttime cough. 5.  See your doctor for recheck as soon as possible return to the emergency department if your symptoms are worsening or changing.

## 2018-12-04 NOTE — ED Provider Notes (Signed)
[facility]-EMERGENCY DEPT Provider Note   CSN: [id number] Arrival date & time: 12/04/18  1434     History   Chief Complaint Chief Complaint  Patient presents with  . Cough  . Headache  . Fatigue    HPI Shelly Frazier is a 62 y.o. female.  HPI Patient reports has had bronchitis for 2 weeks.  She reports she is coughing up green sputum.  She reports sometimes she feels like she has a fever but she has not had chills or shakes.  Normally  she sweats very easily but that is normal for her.  He states that night she coughs a lot and has a hard time sleeping due to the amount of coughing.  No chest pain with coughing.  Reports she does feel short of breath.  No lower extremity swelling or calf pain.  No abdominal pain.  No nausea no vomiting.  Patient does continue to smoke a few cigarettes per day.  She reports some days she does not smoke.  She reports that she gets an albuterol inhaler but since they changed brands it just does not seem to do anything for her so she does not even use it anymore. Past Medical History:  Diagnosis Date  . Anxiety   . Bronchitis   . Chest pain   . COPD (chronic obstructive pulmonary disease) (HCC)   . Elevated fasting glucose   . GERD (gastroesophageal reflux disease)   . Hyperlipemia   . Hypertension   . Nausea without vomiting 04/03/2016  . Neck pain 04/03/2016  . Obesity   . Tobacco abuse     Patient Active Problem List   Diagnosis Date Noted  . Hypokalemia 07/15/2018  . Seasonal allergies 05/31/2018  . Ulnar tunnel syndrome of left wrist 01/12/2018  . Stress incontinence 08/31/2014  . Obesity, unspecified 08/10/2014  . Insomnia 04/13/2014  . Depression with anxiety 10/27/2013  . Smoker 09/29/2012  . COPD (chronic obstructive pulmonary disease) (HCC) 09/28/2012  . GERD (gastroesophageal reflux disease) 03/29/2012  . Hyperlipidemia 03/09/2012  . Hypertension 03/08/2012    Past Surgical History:  Procedure Laterality  Date  . ABDOMINAL HYSTERECTOMY     endometrial, heavy bleeding  . BACK SURGERY  1978  . lumpectomy left breast     benign  . reconstructive surgeries on forehead    . sinus surgery  1987  . TUBAL LIGATION       OB History   None      Home Medications    Prior to Admission medications   Medication Sig Start Date End Date Taking? Authorizing Provider  aspirin 81 MG tablet Take 81 mg by mouth daily.   Yes [provider]  atorvastatin (LIPITOR) 20 MG tablet TAKE 1 TABLET AT BEDTIME 09/07/18  Yes Westley Chandler, MD  carvedilol (COREG) 6.25 MG tablet TAKE 1 TABLET (6.25 MG TOTAL) BY MOUTH 2 (TWO) TIMES DAILY WITH A MEAL. 06/13/18  Yes Doreene Eland, MD  dicyclomine (BENTYL) 20 MG tablet TAKE 1 TABLET BY MOUTH TWICE A DAY 11/14/18  Yes Eniola, Kehinde T, MD  hydrochlorothiazide (HYDRODIURIL) 25 MG tablet TAKE 1 TABLET BY MOUTH EVERY DAY 11/10/18  Yes Doreene Eland, MD  ibuprofen (ADVIL,MOTRIN) 200 MG tablet Take 200 mg by mouth every 6 (six) hours as needed for mild pain.   Yes [provider]  venlafaxine XR (EFFEXOR-XR) 75 MG 24 hr capsule TAKE 1 CAPSULE BY MOUTH EVERY DAY 05/24/18  Yes Eniola, Kehinde T,  MD  azithromycin (ZITHROMAX Z-PAK) 250 MG tablet Take 1 tablet (250 mg total) by mouth daily for 4 days. 12/04/18 12/08/18  Arby Barrette, MD  HYDROcodone-homatropine (HYCODAN) 5-1.5 MG/5ML syrup Take 5 mLs by mouth every 6 (six) hours as needed for cough. 12/04/18   Arby Barrette, MD  KLOR-CON M10 10 MEQ tablet TAKE 1 TABLET BY MOUTH EVERY DAY Patient not taking: Reported on 07/15/2018 06/28/18   Doreene Eland, MD  Misc. Devices (WRIST BRACE) MISC 1 Device by Does not apply route daily. Wear on left hand as needed for wrist discomfort 01/12/18   Garnette Gunner, MD  nitroGLYCERIN (NITROSTAT) 0.4 MG SL tablet Place 1 tablet (0.4 mg total) under the tongue every 5 (five) minutes as needed for chest pain. Do not exceed 3 doses. Patient not taking: Reported on  05/31/2018 12/02/16   Doreene Eland, MD  predniSONE (DELTASONE) 20 MG tablet 2 tabs po daily x 3 days 12/04/18   Arby Barrette, MD  PROAIR HFA 108 [phone] Base) MCG/ACT inhaler TAKE 2 PUFFS BY MOUTH EVERY 6 HOURS AS NEEDED FOR WHEEZE Patient taking differently: Inhale 2 puffs into the lungs every 6 (six) hours as needed for wheezing or shortness of breath.  05/27/18   Doreene Eland, MD  promethazine (PHENERGAN) 25 MG tablet Take 1 tablet (25 mg total) by mouth every 8 (eight) hours as needed for nausea or vomiting. Patient not taking: Reported on 07/15/2018 05/31/18   Doreene Eland, MD  tiotropium (SPIRIVA) 18 MCG inhalation capsule Place 1 capsule (18 mcg total) into inhaler and inhale daily. Patient not taking: Reported on 12/04/2018 01/12/18   Garnette Gunner, MD    Family History Family History  Problem Relation Age of Onset  . Cancer Mother 84       peritoneal cancer  . Heart disease Mother        bypass  . Hypertension Mother   . Cancer Father 52       lung ca  . Depression Sister   . Alcohol abuse Sister   . Liver disease Sister   . Depression Sister   . Stroke Maternal Grandmother   . Heart disease Maternal Grandmother   . Heart disease Maternal Grandfather   . Heart disease Paternal Grandmother   . Heart disease Paternal Grandfather   . Diabetes Neg Hx     Social History Social History   Tobacco Use  . Smoking status: Light Tobacco Smoker    Packs/day: 0.50    Years: 43.00    Pack years: 21.50    Types: Cigarettes    Last attempt to quit: 11/03/2012    Years since quitting: 6.0  . Smokeless tobacco: Never Used  Substance Use Topics  . Alcohol use: No  . Drug use: No     Allergies   Naproxen; Aspirin; and Codeine   Review of Systems Review of Systems 10 Systems reviewed and are negative for acute change except as noted in the HPI.   Physical Exam Updated Vital Signs BP (!) 160/78 (BP Location: Right Arm)   Pulse 84   Temp 98.9 F (37.2 C)  (Oral)   Resp 19   SpO2 95%   Physical Exam  Constitutional: She is oriented to person, place, and time. She appears well-developed and well-nourished. She does not appear ill.  HENT:  Head: Normocephalic and atraumatic.  Mouth/Throat: Oropharynx is clear and moist.  Eyes: Pupils are equal, round, and reactive to light. EOM are normal.  Neck: Neck supple.  Cardiovascular: Normal rate, regular rhythm, normal heart sounds and intact distal pulses.  Pulmonary/Chest:  Occasional cough.  No respiratory distress at rest.  Breath sounds are symmetric.  Breath sounds are soft with occasional expiratory wheeze.  Abdominal: Soft. She exhibits no distension. There is no tenderness. There is no guarding.  Musculoskeletal: Normal range of motion. She exhibits no edema or tenderness.  Neurological: She is alert and oriented to person, place, and time. She exhibits normal muscle tone. Coordination normal.  Skin: Skin is warm and dry.  Psychiatric: She has a normal mood and affect.     ED Treatments / Results  Labs (all labs ordered are listed, but only abnormal results are displayed) Labs Reviewed - No data to display  EKG None  Radiology Dg Chest 2 View  Result Date: 12/04/2018 CLINICAL DATA:  Bronchitis, cough, sore throat EXAM: CHEST - 2 VIEW COMPARISON:  01/17/2014 FINDINGS: Lungs are essentially clear. Chronic blunting of the right costophrenic angle. No definite pleural effusions or pneumothorax. Heart is normal in size. Visualized osseous structures are within normal limits. IMPRESSION: Normal chest radiographs. Electronically Signed   By: Charline BillsSriyesh  Krishnan M.D.   On: 12/04/2018 15:50    Procedures Procedures (including critical care time)  Medications Ordered in ED Medications  albuterol (PROVENTIL HFA;VENTOLIN HFA) 108 (90 Base) MCG/ACT inhaler 2 puff (has no administration in time range)  ipratropium-albuterol (DUONEB) 0.5-2.5 (3) MG/3ML nebulizer solution 3 mL (3 mLs  Nebulization Given 12/04/18 1705)  acetaminophen (TYLENOL) tablet 1,000 mg (1,000 mg Oral Given 12/04/18 1705)  predniSONE (DELTASONE) tablet 60 mg (60 mg Oral Given 12/04/18 1705)  azithromycin (ZITHROMAX) tablet 500 mg (500 mg Oral Given 12/04/18 1705)     Initial Impression / Assessment and Plan / ED Course  I have reviewed the triage vital signs and the nursing notes.  Pertinent labs & imaging results that were available during my care of the patient were reviewed by me and considered in my medical decision making (see chart for details).    Patient does not have respiratory distress or hypoxia.  She has had 2 weeks of productive cough and is a smoker.  At this time will treat for COPD exacerbation with prednisone, inhaler and Zithromax.  Patient is counseled on close follow-up with her PCP.  She is counseled on smoking cessation.  Return precautions are reviewed.  Final Clinical Impressions(s) / ED Diagnoses   Final diagnoses:  Chronic obstructive pulmonary disease with acute exacerbation [facility](HCC)    ED Discharge Orders         Ordered    predniSONE (DELTASONE) 20 MG tablet     12/04/18 1903    HYDROcodone-homatropine (HYCODAN) 5-1.5 MG/5ML syrup  Every 6 hours PRN     12/04/18 1903    azithromycin (ZITHROMAX Z-PAK) 250 MG tablet  Daily     12/04/18 1903           Arby BarrettePfeiffer, Binyamin Nelis, MD 12/04/18 1906

## 2018-12-22 ENCOUNTER — Other Ambulatory Visit: Payer: Self-pay | Admitting: Family Medicine

## 2019-01-04 ENCOUNTER — Other Ambulatory Visit: Payer: Self-pay | Admitting: Family Medicine

## 2019-02-06 ENCOUNTER — Other Ambulatory Visit: Payer: Self-pay | Admitting: Family Medicine

## 2019-02-07 ENCOUNTER — Encounter: Payer: Self-pay | Admitting: Family Medicine

## 2019-02-12 ENCOUNTER — Other Ambulatory Visit: Payer: Self-pay

## 2019-02-12 ENCOUNTER — Emergency Department (HOSPITAL_COMMUNITY): Payer: Medicaid Other

## 2019-02-12 ENCOUNTER — Encounter (HOSPITAL_COMMUNITY): Payer: Self-pay | Admitting: Emergency Medicine

## 2019-02-12 ENCOUNTER — Emergency Department (HOSPITAL_COMMUNITY)
Admission: EM | Admit: 2019-02-12 | Discharge: 2019-02-12 | Disposition: A | Discharge status: Home / Self Care | Payer: Medicaid Other | Attending: Emergency Medicine | Admitting: Emergency Medicine

## 2019-02-12 DIAGNOSIS — F17210 Nicotine dependence, cigarettes, uncomplicated: Secondary | ICD-10-CM | POA: Insufficient documentation

## 2019-02-12 DIAGNOSIS — Z7982 Long term (current) use of aspirin: Secondary | ICD-10-CM | POA: Insufficient documentation

## 2019-02-12 DIAGNOSIS — R05 Cough: Secondary | ICD-10-CM | POA: Diagnosis present

## 2019-02-12 DIAGNOSIS — I10 Essential (primary) hypertension: Secondary | ICD-10-CM | POA: Insufficient documentation

## 2019-02-12 DIAGNOSIS — Z79899 Other long term (current) drug therapy: Secondary | ICD-10-CM | POA: Diagnosis not present

## 2019-02-12 DIAGNOSIS — J441 Chronic obstructive pulmonary disease with (acute) exacerbation: Secondary | ICD-10-CM | POA: Insufficient documentation

## 2019-02-12 MED ORDER — AZITHROMYCIN 250 MG PO TABS: 250.0000 mg | ORAL_TABLET | Freq: Every day | ORAL | 0 refills | Status: DC

## 2019-02-12 MED ORDER — BENZONATATE 100 MG PO CAPS: 200.0000 mg | ORAL_CAPSULE | Freq: Three times a day (TID) | ORAL | 0 refills | Status: DC

## 2019-02-12 MED ORDER — HYDROCODONE-HOMATROPINE 5-1.5 MG/5ML PO SYRP: 5.0000 mL | ORAL_SOLUTION | Freq: Four times a day (QID) | ORAL | 0 refills | Status: DC | PRN

## 2019-02-12 MED ORDER — IPRATROPIUM-ALBUTEROL 0.5-2.5 (3) MG/3ML IN SOLN
3.0000 mL | Freq: Once | RESPIRATORY_TRACT | Status: AC
  Administered 2019-02-12: 3 mL via RESPIRATORY_TRACT
  Filled 2019-02-12: qty 3

## 2019-02-12 MED ORDER — PREDNISONE 10 MG PO TABS: 40.0000 mg | ORAL_TABLET | Freq: Every day | ORAL | 0 refills | Status: AC

## 2019-02-12 MED ORDER — PREDNISONE 20 MG PO TABS
60.0000 mg | ORAL_TABLET | Freq: Once | ORAL | Status: AC
  Administered 2019-02-12: 60 mg via ORAL
  Filled 2019-02-12: qty 3

## 2019-02-12 NOTE — Discharge Instructions (Signed)
Please take the remainder of the steroid and antibiotic course as directed. Return to the ED for worsening symptoms, trouble breathing, trouble swallowing, chest pain, leg swelling.

## 2019-02-12 NOTE — ED Provider Notes (Signed)
[facility]   CSN: [id number] Arrival date & time: 02/12/19  3817     History   Chief Complaint Chief Complaint  Patient presents with  . Cough  . Mucositis    she said her mucos is green.    HPI Shelly Frazier is a 63 y.o. female with a past medical history of COPD, hypertension, tobacco abuse who presents to ED for evaluation of 1 week history of cough productive with green mucus, wheezing and shortness of breath, rhinorrhea.  States that she had a similar flareup several months ago which improved with the medications that she was given.  She does not feel that the inhalers that she has currently are helping her.  She states that "the only one that helps me is the gray 1 with the blue."  She does not recall the name of this.  She has tried NyQuil with no improvement in her cough.  She continues to smoke.  Denies any chest pain, fever, body aches, recent travel, history of DVT, PE, MI.  HPI  Past Medical History:  Diagnosis Date  . Anxiety   . Bronchitis   . Chest pain   . COPD (chronic obstructive pulmonary disease) (HCC)   . Elevated fasting glucose   . GERD (gastroesophageal reflux disease)   . Hyperlipemia   . Hypertension   . Nausea without vomiting 04/03/2016  . Neck pain 04/03/2016  . Obesity   . Tobacco abuse     Patient Active Problem List   Diagnosis Date Noted  . Hypokalemia 07/15/2018  . Seasonal allergies 05/31/2018  . Ulnar tunnel syndrome of left wrist 01/12/2018  . Stress incontinence 08/31/2014  . Obesity, unspecified 08/10/2014  . Insomnia 04/13/2014  . Depression with anxiety 10/27/2013  . Smoker 09/29/2012  . COPD (chronic obstructive pulmonary disease) (HCC) 09/28/2012  . GERD (gastroesophageal reflux disease) 03/29/2012  . Hyperlipidemia 03/09/2012  . Hypertension 03/08/2012    Past Surgical History:  Procedure Laterality Date  . ABDOMINAL HYSTERECTOMY     endometrial, heavy bleeding  . BACK  SURGERY  1978  . lumpectomy left breast     benign  . reconstructive surgeries on forehead    . sinus surgery  1987  . TUBAL LIGATION       OB History   No obstetric history on file.      Home Medications    Prior to Admission medications   Medication Sig Start Date End Date Taking? Authorizing Provider  aspirin 81 MG tablet Take 81 mg by mouth daily.    [provider]  atorvastatin (LIPITOR) 20 MG tablet TAKE 1 TABLET AT BEDTIME 09/07/18   Westley Chandler, MD  azithromycin (ZITHROMAX) 250 MG tablet Take 1 tablet (250 mg total) by mouth daily. Take first 2 tablets together, then 1 every day until finished. 02/12/19   Canyon Willow, PA-C  benzonatate (TESSALON) 100 MG capsule Take 2 capsules (200 mg total) by mouth every 8 (eight) hours. 02/12/19   Pam Vanalstine, PA-C  carvedilol (COREG) 6.25 MG tablet TAKE 1 TABLET (6.25 MG TOTAL) BY MOUTH 2 (TWO) TIMES DAILY WITH A MEAL. 01/05/19   Doreene Eland, MD  dicyclomine (BENTYL) 20 MG tablet TAKE 1 TABLET BY MOUTH TWICE A DAY 02/07/19   Chambliss, Estill Batten, MD  hydrochlorothiazide (HYDRODIURIL) 25 MG tablet TAKE 1 TABLET BY MOUTH EVERY DAY 11/10/18   Doreene Eland, MD  HYDROcodone-homatropine (HYCODAN) 5-1.5 MG/5ML syrup Take 5 mLs by  mouth every 6 (six) hours as needed for cough. 02/12/19   Solly Derasmo, PA-C  ibuprofen (ADVIL,MOTRIN) 200 MG tablet Take 200 mg by mouth every 6 (six) hours as needed for mild pain.    [provider]  KLOR-CON M10 10 MEQ tablet TAKE 1 TABLET BY MOUTH EVERY DAY Patient not taking: Reported on 07/15/2018 06/28/18   Doreene Eland, MD  Misc. Devices (WRIST BRACE) MISC 1 Device by Does not apply route daily. Wear on left hand as needed for wrist discomfort 01/12/18   Garnette Gunner, MD  nitroGLYCERIN (NITROSTAT) 0.4 MG SL tablet Place 1 tablet (0.4 mg total) under the tongue every 5 (five) minutes as needed for chest pain. Do not exceed 3 doses. Patient not taking: Reported on 05/31/2018  12/02/16   Doreene Eland, MD  predniSONE (DELTASONE) 10 MG tablet Take 4 tablets (40 mg total) by mouth daily for 4 days. 02/12/19 02/16/19  Nissim Fleischer, PA-C  PROAIR HFA 108 (90 Base) MCG/ACT inhaler TAKE 2 PUFFS BY MOUTH EVERY 6 HOURS AS NEEDED FOR WHEEZE Patient taking differently: Inhale 2 puffs into the lungs every 6 (six) hours as needed for wheezing or shortness of breath.  05/27/18   Doreene Eland, MD  promethazine (PHENERGAN) 25 MG tablet Take 1 tablet (25 mg total) by mouth every 8 (eight) hours as needed for nausea or vomiting. Patient not taking: Reported on 07/15/2018 05/31/18   Doreene Eland, MD  tiotropium (SPIRIVA) 18 MCG inhalation capsule Place 1 capsule (18 mcg total) into inhaler and inhale daily. Patient not taking: Reported on 12/04/2018 01/12/18   Garnette Gunner, MD  venlafaxine XR (EFFEXOR-XR) 75 MG 24 hr capsule TAKE 1 CAPSULE BY MOUTH EVERY DAY 12/22/18   Doreene Eland, MD    Family History Family History  Problem Relation Age of Onset  . Cancer Mother 7       peritoneal cancer  . Heart disease Mother        bypass  . Hypertension Mother   . Cancer Father 79       lung ca  . Depression Sister   . Alcohol abuse Sister   . Liver disease Sister   . Depression Sister   . Stroke Maternal Grandmother   . Heart disease Maternal Grandmother   . Heart disease Maternal Grandfather   . Heart disease Paternal Grandmother   . Heart disease Paternal Grandfather   . Diabetes Neg Hx     Social History Social History   Tobacco Use  . Smoking status: Light Tobacco Smoker    Packs/day: 0.50    Years: 43.00    Pack years: 21.50    Types: Cigarettes    Last attempt to quit: 11/03/2012    Years since quitting: 6.2  . Smokeless tobacco: Never Used  Substance Use Topics  . Alcohol use: No  . Drug use: No     Allergies   Naproxen; Aspirin; and Codeine   Review of Systems Review of Systems  Constitutional: Negative for appetite change, chills and  fever.  HENT: Positive for rhinorrhea. Negative for ear pain, sneezing and sore throat.   Eyes: Negative for photophobia and visual disturbance.  Respiratory: Positive for cough, shortness of breath and wheezing. Negative for chest tightness.   Cardiovascular: Negative for chest pain and palpitations.  Gastrointestinal: Negative for abdominal pain, blood in stool, constipation, diarrhea, nausea and vomiting.  Genitourinary: Negative for dysuria, hematuria and urgency.  Musculoskeletal: Negative for myalgias.  Skin: Negative for rash.  Neurological: Negative for dizziness, weakness and light-headedness.     Physical Exam Updated Vital Signs BP 115/70 (BP Location: Left Arm)   Pulse 80   Temp 98.7 F (37.1 C) (Oral)   Resp 14   Ht 5\' 5"  (1.651 m)   Wt 78.5 kg   SpO2 95%   BMI 28.79 kg/m   Physical Exam Vitals signs and nursing note reviewed.  Constitutional:      General: She is not in acute distress.    Appearance: She is well-developed.     Comments: Speaking in complete sentences without difficulty.  HENT:     Head: Normocephalic and atraumatic.     Nose: Nose normal.  Eyes:     General: No scleral icterus.       Right eye: No discharge.        Left eye: No discharge.     Conjunctiva/sclera: Conjunctivae normal.  Neck:     Musculoskeletal: Normal range of motion and neck supple.  Cardiovascular:     Rate and Rhythm: Normal rate and regular rhythm.     Heart sounds: Normal heart sounds. No murmur. No friction rub. No gallop.   Pulmonary:     Effort: Pulmonary effort is normal. No respiratory distress.     Breath sounds: Normal breath sounds.     Comments: Slightly diminished breath sounds globally. Abdominal:     General: Bowel sounds are normal. There is no distension.     Palpations: Abdomen is soft.     Tenderness: There is no abdominal tenderness. There is no guarding.  Musculoskeletal: Normal range of motion.  Skin:    General: Skin is warm and dry.      Findings: No rash.  Neurological:     Mental Status: She is alert.     Motor: No abnormal muscle tone.     Coordination: Coordination normal.      ED Treatments / Results  Labs (all labs ordered are listed, but only abnormal results are displayed) Labs Reviewed - No data to display  EKG EKG Interpretation  Date/Time:  Sunday February 12 2019 06:56:29 EST Ventricular Rate:  87 PR Interval:    QRS Duration: 93 QT Interval:  384 QTC Calculation: 462 R Axis:   74 Text Interpretation:  Sinus rhythm Abnormal R-wave progression, early transition Baseline wander in lead(s) II since last tracing no significant change Confirmed by Mancel BaleWentz, Elliott [phone]) on 02/12/2019 7:12:49 AM   Radiology Dg Chest 2 View  Result Date: 02/12/2019 CLINICAL DATA:  Productive cough EXAM: CHEST - 2 VIEW COMPARISON:  December 04, 2018 FINDINGS: There is slight scarring in the right base. There is slight right apical pleural thickening. There is no appreciable edema or consolidation. Heart size and pulmonary vascularity are normal. No adenopathy. No bone lesions. IMPRESSION: Mild scarring right base. Slight right apical pleural thickening. No edema or consolidation. Stable cardiac silhouette. Electronically Signed   By: Bretta BangWilliam  Woodruff III M.D.   On: 02/12/2019 07:20    Procedures Procedures (including critical care time)  Medications Ordered in ED Medications  ipratropium-albuterol (DUONEB) 0.5-2.5 (3) MG/3ML nebulizer solution 3 mL (3 mLs Nebulization Given 02/12/19 0650)  predniSONE (DELTASONE) tablet 60 mg (60 mg Oral Given 02/12/19 [id number])     Initial Impression / Assessment and Plan / ED Course  I have reviewed the triage vital signs and the nursing notes.  Pertinent labs & imaging results that were available during my care of the patient were  reviewed by me and considered in my medical decision making (see chart for details).     63 year old female with a past medical history of COPD,  hypertension, tobacco abuse presents to ED for 1 week history of cough productive with green mucus, wheezing and shortness of breath, rhinorrhea.  She had a similar flareup several months ago and improved with the medications that were given to her.  She is not getting much relief from NyQuil or her current inhalers.  She denies chest pain, fever.  She continues to smoke.  She has globally diminished breath sounds.  She is not tachycardic, tachypneic or hypoxic on my exam.  She is speaking in complete sentences.  Plan is to give DuoNeb treatment, first dose of prednisone, obtain EKG, chest x-ray and reassess.  EKG shows sinus rhythm with no changes from prior tracings.  Chest x-ray shows findings consistent with COPD.  She is reporting improvement in her symptoms with medications given here.  Will discharge home with antibiotics for COPD exacerbation as well as remainder of steroid course.  Will advise her to return to ED for any severe worsening symptoms.  Patient is hemodynamically stable, in NAD, and able to ambulate in the ED. Evaluation does not show pathology that would require ongoing emergent intervention or inpatient treatment. I explained the diagnosis to the patient. Pain has been managed and has no complaints prior to discharge. Patient is comfortable with above plan and is stable for discharge at this time. All questions were answered prior to disposition. Strict return precautions for returning to the ED were discussed. Encouraged follow up with PCP.    Portions of this note were generated with Scientist, clinical (histocompatibility and immunogenetics)Dragon dictation software. Dictation errors may occur despite best attempts at proofreading.  Final Clinical Impressions(s) / ED Diagnoses   Final diagnoses:  COPD exacerbation [facility](HCC)    ED Discharge Orders         Ordered    HYDROcodone-homatropine (HYCODAN) 5-1.5 MG/5ML syrup  Every 6 hours PRN     02/12/19 0730    azithromycin (ZITHROMAX) 250 MG tablet  Daily     02/12/19 0730     predniSONE (DELTASONE) 10 MG tablet  Daily     02/12/19 0730    benzonatate (TESSALON) 100 MG capsule  Every 8 hours     02/12/19 0730           Dietrich PatesKhatri, Marquarius Lofton, PA-C 02/12/19 0732    Mancel BaleWentz, Elliott, MD 02/12/19 1330

## 2019-03-09 ENCOUNTER — Encounter: Payer: Self-pay | Admitting: Family Medicine

## 2019-03-10 ENCOUNTER — Other Ambulatory Visit: Payer: Self-pay | Admitting: Family Medicine

## 2019-03-10 ENCOUNTER — Encounter: Payer: Self-pay | Admitting: Family Medicine

## 2019-03-10 NOTE — Telephone Encounter (Signed)
Patient called to check on status.  Ples Specter, RN [facility] Clinic RN)

## 2019-03-20 ENCOUNTER — Encounter: Payer: Self-pay | Admitting: Family Medicine

## 2019-04-03 ENCOUNTER — Encounter: Payer: Self-pay | Admitting: Family Medicine

## 2019-04-03 ENCOUNTER — Other Ambulatory Visit: Payer: Self-pay | Admitting: Family Medicine

## 2019-04-03 MED ORDER — LEVOCETIRIZINE DIHYDROCHLORIDE 5 MG PO TABS: 5.0000 mg | ORAL_TABLET | Freq: Every evening | ORAL | 1 refills | Status: DC

## 2019-05-24 ENCOUNTER — Other Ambulatory Visit: Payer: Self-pay | Admitting: Family Medicine

## 2019-07-16 ENCOUNTER — Other Ambulatory Visit: Payer: Self-pay | Admitting: Family Medicine

## 2019-07-20 ENCOUNTER — Encounter: Payer: Self-pay | Admitting: Family Medicine

## 2019-09-14 ENCOUNTER — Encounter: Payer: Self-pay | Admitting: Family Medicine

## 2019-09-14 ENCOUNTER — Other Ambulatory Visit: Payer: Self-pay

## 2019-09-14 ENCOUNTER — Telehealth (INDEPENDENT_AMBULATORY_CARE_PROVIDER_SITE_OTHER): Payer: Medicaid Other | Admitting: Family Medicine

## 2019-09-14 ENCOUNTER — Other Ambulatory Visit: Payer: Self-pay | Admitting: *Deleted

## 2019-09-14 DIAGNOSIS — Z20828 Contact with and (suspected) exposure to other viral communicable diseases: Secondary | ICD-10-CM

## 2019-09-14 DIAGNOSIS — Z20822 Contact with and (suspected) exposure to covid-19: Secondary | ICD-10-CM

## 2019-09-14 DIAGNOSIS — I10 Essential (primary) hypertension: Secondary | ICD-10-CM

## 2019-09-14 MED ORDER — DICYCLOMINE HCL 20 MG PO TABS: 20.0000 mg | ORAL_TABLET | Freq: Two times a day (BID) | ORAL | 1 refills | Status: DC

## 2019-09-14 MED ORDER — HYDROCHLOROTHIAZIDE 25 MG PO TABS: 25.0000 mg | ORAL_TABLET | Freq: Every day | ORAL | 1 refills | Status: DC

## 2019-09-14 NOTE — Progress Notes (Signed)
[place] Telemedicine Visit  Patient consented to have virtual visit. Method of visit: Telephone  Encounter participants: Patient: Shelly Frazier - located at home  Provider: Martyn Malay - located at [facility] Others (if applicable): none  Chief Complaint: exposure to covid 19  HPI:  Shelly Frazier is a pleasant 63 year old woman with history of COPD, HTN, and irritable bowel syndrome presenting with significant concerns for COVID. The patient reports she had an aunt die of COVID earlier this year. She saw her sister over the weekend. Her sister (and sister's 7 year old boyfriend) were both diagnosed with COVID on 9/15. She denies dyspnea, chest pain, cough, congestion, fevers, chills, decreased oral intake. She feels very worried.   She requests a refill on HCTZ. Last BMET one year ago. No dizziness, chest pain, or side effects such as urinary frequency.   She also has a history of irritable bowel disease and requests a refill on Bentyl. No urinary symptoms or dry mouth.   ROS: per HPI  Pertinent PMHx:  HTN COPD Tobacco use  Exam:  Respiratory: speaking in full sentences, speaking in full complete sentences, no distress   Assessment/Plan: Diagnoses and all orders for this visit:  Exposure to Covid-19 Virus -     Novel Coronavirus, NAA ([facility]) - Discussed signs and symptoms to call EMS, reasons to call clinic, and precautions. Recommended she self-isolate until result returns. All questions answered.   Essential hypertension, refilled HCTZ, she will schedule appointment for lab when corona virus has calmed down, very anxious still.   IBS -     dicyclomine (BENTYL) 20 MG tablet; Take 1 tablet (20 mg total) by mouth 2 (two) times daily.  Time spent during visit with patient: 8 minutes  Dorris Singh, MD  [facility] Medicine Teaching Service

## 2019-09-16 ENCOUNTER — Other Ambulatory Visit: Payer: Self-pay | Admitting: Family Medicine

## 2019-09-16 LAB — NOVEL CORONAVIRUS, NAA: SARS-CoV-2, NAA: NOT DETECTED

## 2019-09-17 ENCOUNTER — Telehealth: Payer: Self-pay | Admitting: Family Medicine

## 2019-09-17 NOTE — Telephone Encounter (Signed)
Called patient with negative COVID results. Asymptomatic at this time. Patient still very worried about exposure. Discussed reasons to call and return to care.  Shelly Singh, MD  Family Medicine Teaching Service

## 2019-10-09 ENCOUNTER — Other Ambulatory Visit: Payer: Self-pay | Admitting: Family Medicine

## 2019-10-13 ENCOUNTER — Other Ambulatory Visit: Payer: Self-pay | Admitting: Family Medicine

## 2019-10-13 ENCOUNTER — Encounter: Payer: Self-pay | Admitting: Family Medicine

## 2019-10-13 MED ORDER — PROMETHAZINE HCL 25 MG PO TABS: 25.0000 mg | ORAL_TABLET | Freq: Three times a day (TID) | ORAL | 0 refills | Status: DC | PRN

## 2019-10-16 ENCOUNTER — Encounter: Payer: Self-pay | Admitting: Family Medicine

## 2019-10-16 NOTE — Progress Notes (Signed)
From: [email or web address] (Surescripts HISP)  Addressed To: Kinnie Feil, MD  Subject: [facility] Immunization Notification  Message Text  One of your patients recently received an immunization at one of our clinics. Please see attachment for details.  Patient Last Name: [facility] Patient First Name: [facility] Patient Date Of Birth: [birth year]  [facility] Immunization Notification  Jump to Section ? Document InformationImmunizations AdministeredPatient Demographics [place] Immunization Notification, generated on Oct. 17, 2020October 17, 2020 Printout Information  Document Contents Document Received Date Document Source Organization  Continuity of Care Document Oct. 17, 2020October 17, 2020 Surescripts HISP   Patient Demographics - 64 y.o. Female; born [birth year]  Patient Address Communication Language Race / Ethnicity Marital Status  [address] [place], [place] [address] [phone] Unknown Unknown / Unknown Unknown   Immunizations Administered  A [facility] health care provider has provided immunization services to the patient named above. The patient has identified you as their primary care provider. Please update the patient's chart to include the following immunization(s) listed below. In addition, immunization information will be reported to the state immunization registry where permitted.   Vaccine Administered Dose Injection Route / Site Date Administered Manufacturer Lot # Expiration Date Administering Provider  ([id number]) Influenza, injectable, MDCK, preservative free, quadrivalent .43 ML IM / LD 10/12/2019 [place] [id number] 06/26/2020   IMPORTANT WARNING: This message is intended for the use of the person or entity to whom it is addressed and may contain information that is privileged and confidential, the disclosure of which is governed by applicable law. If the reader of this message is not the  intended recipient, or the employee or agent responsible for delivering it to the intended recipient, you are hereby notified that any dissemination, distribution or copying of this information is STRICTLY PROHIBITED. 'If you have received this message in error, please notify us immediately.'   Images Document Information   [employer]ian Organization  [facility] Pharmac[facility] Immunization Program  [place], [place] [address] [place] [address]

## 2019-10-16 NOTE — Patient Instructions (Signed)
[facility] vaccine report

## 2019-12-09 ENCOUNTER — Encounter: Payer: Self-pay | Admitting: Family Medicine

## 2020-01-12 ENCOUNTER — Encounter: Payer: Self-pay | Admitting: Family Medicine

## 2020-01-18 ENCOUNTER — Other Ambulatory Visit: Payer: Self-pay | Admitting: Family Medicine

## 2020-01-31 ENCOUNTER — Other Ambulatory Visit: Payer: Self-pay | Admitting: Family Medicine

## 2020-01-31 NOTE — Telephone Encounter (Signed)
Please advise this patient that given her age, (I.e 68 or more), refill is not appropriate. It could produce serious side effect in geriatric population. If she does not have any symptoms, she need not get this med refilled. Please have her f/u with me soon.

## 2020-02-01 NOTE — Telephone Encounter (Signed)
LM for patient to call back and schedule an appointment with Dr. Lum Babe.  Also let her know that she would not be refilling medication and reason for this.  Ok per [employer]. Mliss Wedin,CMA

## 2020-02-12 ENCOUNTER — Other Ambulatory Visit: Payer: Self-pay | Admitting: *Deleted

## 2020-02-12 MED ORDER — ATORVASTATIN CALCIUM 20 MG PO TABS: 20.0000 mg | ORAL_TABLET | Freq: Every day | ORAL | 3 refills | Status: DC

## 2020-03-05 ENCOUNTER — Other Ambulatory Visit: Payer: Self-pay | Admitting: *Deleted

## 2020-03-05 MED ORDER — DICYCLOMINE HCL 20 MG PO TABS: 20.0000 mg | ORAL_TABLET | Freq: Two times a day (BID) | ORAL | 1 refills | Status: DC

## 2020-04-15 ENCOUNTER — Other Ambulatory Visit: Payer: Self-pay | Admitting: Family Medicine

## 2020-06-10 ENCOUNTER — Other Ambulatory Visit: Payer: Self-pay | Admitting: *Deleted

## 2020-06-10 MED ORDER — HYDROCHLOROTHIAZIDE 25 MG PO TABS: 25.0000 mg | ORAL_TABLET | Freq: Every day | ORAL | 1 refills | Status: DC

## 2020-07-26 ENCOUNTER — Other Ambulatory Visit: Payer: Self-pay | Admitting: Family Medicine

## 2020-07-30 ENCOUNTER — Encounter: Payer: Self-pay | Admitting: Family Medicine

## 2020-08-06 ENCOUNTER — Other Ambulatory Visit: Payer: Self-pay | Admitting: Family Medicine

## 2020-08-07 NOTE — Telephone Encounter (Signed)
Attempted to reach pt. No answer. LVM for pt to call the office to make a 2 week appt with Dr. Gretta Arab for a follow up on medication. Aquilla Solian, CMA

## 2020-08-08 NOTE — Telephone Encounter (Signed)
LM for patient to call back. Seirra Kos,CMA  

## 2020-08-15 ENCOUNTER — Other Ambulatory Visit: Payer: Self-pay | Admitting: Family Medicine

## 2020-08-17 ENCOUNTER — Encounter: Payer: Self-pay | Admitting: Family Medicine

## 2020-08-19 NOTE — Telephone Encounter (Signed)
Appt 08/23/20 with Dr. Leary Roca.  Noor Witte,CMA

## 2020-08-23 ENCOUNTER — Ambulatory Visit: Payer: Medicare Other | Admitting: Family Medicine

## 2020-08-24 ENCOUNTER — Other Ambulatory Visit: Payer: Self-pay | Admitting: Family Medicine

## 2020-08-26 ENCOUNTER — Other Ambulatory Visit: Payer: Self-pay

## 2020-08-26 MED ORDER — VENLAFAXINE HCL ER 75 MG PO CP24: ORAL_CAPSULE | ORAL | 0 refills | Status: DC

## 2020-08-26 NOTE — Telephone Encounter (Signed)
Patient calls nurse line regarding receiving refill on Effexor. Patient states that she has scheduled appointment for 09/03/2020 but is requesting refill to last until the 7th.   To PCP  Please advise  Veronda Prude, RN

## 2020-09-01 ENCOUNTER — Other Ambulatory Visit: Payer: Self-pay | Admitting: Family Medicine

## 2020-09-02 ENCOUNTER — Encounter: Payer: Self-pay | Admitting: Family Medicine

## 2020-09-03 ENCOUNTER — Encounter: Payer: Self-pay | Admitting: Family Medicine

## 2020-09-03 ENCOUNTER — Ambulatory Visit (INDEPENDENT_AMBULATORY_CARE_PROVIDER_SITE_OTHER): Payer: Medicare Other | Admitting: Family Medicine

## 2020-09-03 ENCOUNTER — Other Ambulatory Visit: Payer: Self-pay

## 2020-09-03 VITALS — BP 159/80 | HR 82 | Ht 65.0 in | Wt 183.6 lb

## 2020-09-03 DIAGNOSIS — J302 Other seasonal allergic rhinitis: Secondary | ICD-10-CM

## 2020-09-03 DIAGNOSIS — E669 Obesity, unspecified: Secondary | ICD-10-CM

## 2020-09-03 DIAGNOSIS — G4489 Other headache syndrome: Secondary | ICD-10-CM | POA: Diagnosis not present

## 2020-09-03 DIAGNOSIS — I10 Essential (primary) hypertension: Secondary | ICD-10-CM

## 2020-09-03 DIAGNOSIS — Z720 Tobacco use: Secondary | ICD-10-CM | POA: Diagnosis not present

## 2020-09-03 DIAGNOSIS — D518 Other vitamin B12 deficiency anemias: Secondary | ICD-10-CM

## 2020-09-03 DIAGNOSIS — E2839 Other primary ovarian failure: Secondary | ICD-10-CM | POA: Diagnosis not present

## 2020-09-03 DIAGNOSIS — F418 Other specified anxiety disorders: Secondary | ICD-10-CM | POA: Diagnosis not present

## 2020-09-03 DIAGNOSIS — J42 Unspecified chronic bronchitis: Secondary | ICD-10-CM

## 2020-09-03 DIAGNOSIS — Z23 Encounter for immunization: Secondary | ICD-10-CM

## 2020-09-03 DIAGNOSIS — R5383 Other fatigue: Secondary | ICD-10-CM

## 2020-09-03 DIAGNOSIS — Z1231 Encounter for screening mammogram for malignant neoplasm of breast: Secondary | ICD-10-CM | POA: Diagnosis not present

## 2020-09-03 DIAGNOSIS — E785 Hyperlipidemia, unspecified: Secondary | ICD-10-CM | POA: Diagnosis not present

## 2020-09-03 MED ORDER — TIOTROPIUM BROMIDE MONOHYDRATE 18 MCG IN CAPS
18.0000 ug | ORAL_CAPSULE | Freq: Every day | RESPIRATORY_TRACT | 3 refills | Status: DC
Start: 2020-09-03 — End: 2020-09-17

## 2020-09-03 MED ORDER — ZOSTER VAC RECOMB ADJUVANTED 50 MCG/0.5ML IM SUSR: INTRAMUSCULAR | 1 refills | Status: DC

## 2020-09-03 MED ORDER — ALBUTEROL SULFATE HFA 108 (90 BASE) MCG/ACT IN AERS: INHALATION_SPRAY | RESPIRATORY_TRACT | 0 refills | Status: AC

## 2020-09-03 MED ORDER — PROMETHAZINE HCL 25 MG PO TABS: 25.0000 mg | ORAL_TABLET | Freq: Three times a day (TID) | ORAL | 0 refills | Status: DC | PRN

## 2020-09-03 MED ORDER — LEVOCETIRIZINE DIHYDROCHLORIDE 5 MG PO TABS
ORAL_TABLET | ORAL | 1 refills | Status: DC
Start: 2020-09-03 — End: 2022-12-16

## 2020-09-03 MED ORDER — MOMETASONE FUROATE 50 MCG/ACT NA SUSP: 2.0000 | Freq: Every day | NASAL | 12 refills | Status: DC

## 2020-09-03 NOTE — Assessment & Plan Note (Signed)
PHQ9 looks good. As discussed with her, she might be fatigue for various other reasons. We will keep current dose of her Effexor. F/U in 4 weeks for reassessment or sooner if needed.

## 2020-09-03 NOTE — Assessment & Plan Note (Signed)
FLP checked today. 

## 2020-09-03 NOTE — Progress Notes (Signed)
SUBJECTIVE:   CHIEF COMPLAINT / HPI:   HTN/HLD: here for f/u. She is compliant with Lipitor 20 mg qd for her Cholesterol and HCTZ 25 mg Qd, Coreg 6.25 mg BID for her BP. No new concerns.  COPD/Smoker:Off albuterol and Spiriva for months since she has been feeling well. She cut back on tobacco smoking to 3-4 sticks per week and felt this help improve her COPD a lot.  Depression/Fatigue: She denies any feeling of depression. Did c/o excessive fatigue and felt this might be related to her fatigue. She wonders if a higher dose of her Effexor will help with her fatigue. All she does at home all day is sleep. She has not being going out much lately since COVID pandemic.  Headache: Here for follow-up. Headache for year, now present daily. Mostly occipital. Denies any recent head injury. No vision change. Uses Tylenol as needed. She wants to get to the bottom of this headache.  Obese: Since COVID-19 pandemic, she had not been out much and does not get exercise done regularly. She also endorses poor diet, she loves Carrot cake, less fruits and vege.  Allergies: Xyzal not so effective. She also sometimes feels nauseas and she requested a refill of her Phenergan.   PERTINENT  PMH / PSH: PMX reviewed.  OBJECTIVE:   Vitals:   09/03/20 1056 09/03/20 1125  BP: (!) 158/86 (!) 159/80  Pulse: 82   SpO2: 97%   Weight: 183 lb 9.6 oz (83.3 kg)   Height: 5\' 5"  (1.651 m)     Physical Exam Vitals and nursing note reviewed.  Cardiovascular:     Rate and Rhythm: Normal rate and regular rhythm.     Pulses: Normal pulses.     Heart sounds: Normal heart sounds. No murmur heard.   Pulmonary:     Effort: Pulmonary effort is normal. No respiratory distress.     Breath sounds: Normal breath sounds. No wheezing or rhonchi.  Abdominal:     General: Abdomen is flat. Bowel sounds are normal. There is no distension.     Palpations: There is no mass.     Tenderness: There is no abdominal tenderness.    Musculoskeletal:        General: Normal range of motion.  Neurological:     General: No focal deficit present.     Mental Status: She is alert.     Cranial Nerves: Cranial nerves are intact.     Sensory: Sensation is intact.     Motor: Motor function is intact.     Coordination: Coordination is intact.     Deep Tendon Reflexes: Reflexes are normal and symmetric.      Office Visit from 09/03/2020 in [facility]  PHQ-9 Total Score 3       ASSESSMENT/PLAN:   Hypertension BP slightly elevated today. Will hold off on changing her regimen. Home BP measures recommended for the next 1-2 weeks. Cmet ordered today. I will contact her with result and discuss med adjustment. Note hx of hypokalemia in the past and plan was to take her off HCTZ if it persists any way. She verbalized understanding and agreed with the plan.  Hyperlipidemia FLP checked today.  COPD (chronic obstructive pulmonary disease) (HCC) Poor compliance with medication. Good job on cutting back on tobacco smoking. Counseling done on need for daily use of his Spiriva. I also recommended PFT appointment with Dr. Borgarnes which she will schedule. I refilled her albuterol and Spiriva today.  Tobacco abuse Great job cutting back.  Depression with anxiety PHQ9 looks good. As discussed with her, she might be fatigue for various other reasons. We will keep current dose of her Effexor. F/U in 4 weeks for reassessment or sooner if needed.  Fatigue Etiology unclear. Differentials include depression, although her PHQ9 looks good. Anemia: CBC and B12 checked today. Electrolyte abnormality, hx of hypokalemia: Cmet checked. TSH added to lab.  Will contact her with all her results. Graded exercise recommended.   Headache, occipital Going on for years, now worrisome. No neurologic deficit on exam. CT ordered. Tylenol/Ibuprofen for pain as needed. ED precaution discussed.  Obesity,  unspecified Diet and exercise counseling. Nutritionist referral discussed. She declined referral at this time.  Seasonal allergies Zyxal refilled. Nasonex added for symptoms improvement.   Health maintenance: Flu shot and Pneumovax completed today. Mammogram discussed and was ordered. She will schedule her appointment. Dexa scan ordered.  She already got her COVID-19 shot and this was updated on file today.  Janit Pagan, MD [facility]

## 2020-09-03 NOTE — Assessment & Plan Note (Signed)
Etiology unclear. Differentials include depression, although her PHQ9 looks good. Anemia: CBC and B12 checked today. Electrolyte abnormality, hx of hypokalemia: Cmet checked. TSH added to lab.  Will contact her with all her results. Graded exercise recommended.

## 2020-09-03 NOTE — Assessment & Plan Note (Signed)
Poor compliance with medication. Good job on cutting back on tobacco smoking. Counseling done on need for daily use of his Spiriva. I also recommended PFT appointment with Dr. Raymondo Band which she will schedule. I refilled her albuterol and Spiriva today.

## 2020-09-03 NOTE — Assessment & Plan Note (Signed)
Going on for years, now worrisome. No neurologic deficit on exam. CT ordered. Tylenol/Ibuprofen for pain as needed. ED precaution discussed.

## 2020-09-03 NOTE — Assessment & Plan Note (Signed)
Zyxal refilled. Nasonex added for symptoms improvement.

## 2020-09-03 NOTE — Patient Instructions (Signed)

## 2020-09-03 NOTE — Assessment & Plan Note (Signed)
Diet and exercise counseling. Nutritionist referral discussed. She declined referral at this time.

## 2020-09-03 NOTE — Assessment & Plan Note (Signed)
Great job cutting back.

## 2020-09-03 NOTE — Assessment & Plan Note (Signed)
BP slightly elevated today. Will hold off on changing her regimen. Home BP measures recommended for the next 1-2 weeks. Cmet ordered today. I will contact her with result and discuss med adjustment. Note hx of hypokalemia in the past and plan was to take her off HCTZ if it persists any way. She verbalized understanding and agreed with the plan.

## 2020-09-04 ENCOUNTER — Telehealth: Payer: Self-pay | Admitting: Family Medicine

## 2020-09-04 LAB — CMP14+EGFR
ALT: 19 IU/L (ref 0–32)
AST: 18 IU/L (ref 0–40)
Albumin/Globulin Ratio: 1.6 (ref 1.2–2.2)
Albumin: 4.4 g/dL (ref 3.8–4.8)
Alkaline Phosphatase: 109 IU/L (ref 48–121)
BUN/Creatinine Ratio: 19 (ref 12–28)
BUN: 15 mg/dL (ref 8–27)
Bilirubin Total: 0.4 mg/dL (ref 0.0–1.2)
CO2: 30 mmol/L — ABNORMAL HIGH (ref 20–29)
Calcium: 9.8 mg/dL (ref 8.7–10.3)
Chloride: 99 mmol/L (ref 96–106)
Creatinine, Ser: 0.79 mg/dL (ref 0.57–1.00)
GFR calc Af Amer: 91 mL/min/{1.73_m2} (ref >59)
GFR calc non Af Amer: 79 mL/min/{1.73_m2} (ref >59)
Globulin, Total: 2.8 g/dL (ref 1.5–4.5)
Glucose: 100 mg/dL — ABNORMAL HIGH (ref 65–99)
Potassium: 3.6 mmol/L (ref 3.5–5.2)
Sodium: 142 mmol/L (ref 134–144)
Total Protein: 7.2 g/dL (ref 6.0–8.5)

## 2020-09-04 LAB — LIPID PANEL
Chol/HDL Ratio: 3.4 ratio (ref 0.0–4.4)
Cholesterol, Total: 200 mg/dL — ABNORMAL HIGH (ref 100–199)
HDL: 59 mg/dL (ref >39)
LDL Chol Calc (NIH): 103 mg/dL — ABNORMAL HIGH (ref 0–99)
Triglycerides: 226 mg/dL — ABNORMAL HIGH (ref 0–149)
VLDL Cholesterol Cal: 38 mg/dL (ref 5–40)

## 2020-09-04 LAB — VITAMIN B12: Vitamin B-12: 297 pg/mL (ref 232–1245)

## 2020-09-04 LAB — TSH: TSH: 9.39 u[IU]/mL — ABNORMAL HIGH (ref 0.450–4.500)

## 2020-09-04 LAB — CBC
Hematocrit: 43.7 % (ref 34.0–46.6)
Hemoglobin: 15 g/dL (ref 11.1–15.9)
MCH: 31.1 pg (ref 26.6–33.0)
MCHC: 34.3 g/dL (ref 31.5–35.7)
MCV: 91 fL (ref 79–97)
Platelets: 222 10*3/uL (ref 150–450)
RBC: 4.82 x10E6/uL (ref 3.77–5.28)
RDW: 13.2 % (ref 11.7–15.4)
WBC: 9.3 10*3/uL (ref 3.4–10.8)

## 2020-09-04 NOTE — Telephone Encounter (Signed)
Result discussed with her.  FLP worsened from the last. She is compliant with her Lipitor 20 mg qd. She has gained weight from last visit due to calorie intake and less exercise. She will work on Lifestyle modification plus current dose of Lipitor and recheck lab in about 6-12 months.   THS elevated which explains her fatigue. She is advised f/u in 2 weeks for retest and TFT. Appointment scheduled.  No further questions.

## 2020-09-05 ENCOUNTER — Encounter: Payer: Self-pay | Admitting: Family Medicine

## 2020-09-12 ENCOUNTER — Ambulatory Visit: Payer: Medicare Other | Admitting: Pharmacist

## 2020-09-17 ENCOUNTER — Other Ambulatory Visit: Payer: Self-pay

## 2020-09-17 ENCOUNTER — Ambulatory Visit (INDEPENDENT_AMBULATORY_CARE_PROVIDER_SITE_OTHER): Payer: Medicare Other | Admitting: Pharmacist

## 2020-09-17 ENCOUNTER — Other Ambulatory Visit: Payer: Self-pay | Admitting: Family Medicine

## 2020-09-17 ENCOUNTER — Encounter: Payer: Self-pay | Admitting: Pharmacist

## 2020-09-17 DIAGNOSIS — Z720 Tobacco use: Secondary | ICD-10-CM

## 2020-09-17 DIAGNOSIS — J42 Unspecified chronic bronchitis: Secondary | ICD-10-CM | POA: Diagnosis not present

## 2020-09-17 LAB — PULMONARY FUNCTION TEST

## 2020-09-17 MED ORDER — TRELEGY ELLIPTA 100-62.5-25 MCG/INH IN AEPB: 1.0000 | INHALATION_SPRAY | Freq: Every day | RESPIRATORY_TRACT | 11 refills | Status: AC

## 2020-09-17 MED ORDER — MOMETASONE FUROATE 50 MCG/ACT NA SUSP: 2.0000 | Freq: Every day | NASAL | 12 refills | Status: DC

## 2020-09-17 NOTE — Assessment & Plan Note (Signed)
Chronic tobacco abuse - currently mild.  Patient willing and motivated to quit smoking in the upcoming weeks.  She verbalized understanding that her lungs were limiting her activities and she expressed a strong desire to avoid oxygen therapy. Quit date in the next 10 days.  - Plan phone follow-up of tobacco cessation in 10 days.  Reassess progress at that time.

## 2020-09-17 NOTE — Progress Notes (Signed)
Reviewed: I agree with Dr. Koval's documentation and management. 

## 2020-09-17 NOTE — Assessment & Plan Note (Signed)
Patient has been experiencing shortness of breath with exertion for many years.  She has been diagnosed with spirometry for > 8 years.  Patient is NOT taking any inhalers routinely at the time of this evaluation.  She takes albuterol PRN. Spirometry evaluation with pre- and post-bronchodilator reveals obstruction, rated as moderate -severe with reversibility.  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 Lum Babe, PCP, and we decided to treat with Triple therapy based on, spiromtery, CAT score and exacerbations in the past.    -Initiated Trelegy Ellipta.  -Educated patient on purpose, proper use, potential adverse effects including the risk of esophageal candidiasis and need to rinse mouth after each use.   -Reviewed results of pulmonary function tests.  Pt verbalized understanding of results and education.

## 2020-09-17 NOTE — Patient Instructions (Signed)
Nice to meet you today.   Your lung function test showed obstruction and reversibility.  Please start Trelegy Ellipta once daily.   Follow-up with Dr. Lum Babe in the future.

## 2020-09-17 NOTE — Progress Notes (Signed)
S:    Patient arrives in good spirits, ambulating without assistance.    Presents for lung function evaluation.  She reports previous PFT 8-9 years ago when she was diagnosed with COPD. Patient was referred and last seen by Primary Care Provider, Dr. Lum Babe on 09/03/2020.   Patient reports breathing has been better since cutting back on cigarettes.  She shared that she has smoked less since moving into a new apartment ~ 4 years ago.  She is required to smoke outside and currently smokes in her car primarily.   Medication adherence reported good, however she has not been consistently taking her inhalers.  She states she does not feel the albuterol in the red device is as effective as the old blue albuterol device.  She denies using any tiotropium for the last several years.  She has not pick-up her new supply. Patient reports last dose of COPD medications was > 3 days since last albuterol dose. Current COPD medications: tiotropium handihaler Rescue inhaler use frequency: PRN Patient exacerbation hx: < 1x per year over the last 3-4 years however has had multiple per year in the past.  Patient reports that smoking intake reduction has helped decrease exacerbations.   Started smoking - daily:  Age 45 Max per day 1.5 ppd ([place] / generic) No extended quit duration as an adult   Importance of quitting 10/10 Confidence in quitting in the next 10 days:  10/10   O: Physical Exam Vitals reviewed.  Constitutional:      Appearance: Normal appearance.  Pulmonary:     Comments: Prolonged expiration Neurological:     Mental Status: She is alert.  Psychiatric:        Mood and Affect: Mood normal.        Behavior: Behavior normal.        Thought Content: Thought content normal.    Review of Systems  HENT: Positive for congestion.   Respiratory: Positive for cough, sputum production and shortness of breath.   Neurological: Positive for headaches.     Vitals:   09/17/20 0912  BP:  138/74  Pulse: 82  SpO2: 98%    mMRC score= 2 CAT score= 19 See Documentation Flowsheet - CAT/COPD for complete symptom scoring.  See "scanned report" or Documentation Flowsheet (discrete results - PFTs) for Spirometry results. Patient provided good effort while attempting spirometry.   Lung Age = 89 Albuterol Neb  Lot# [id number]     Exp. 11/2020   A/P: Patient has been experiencing shortness of breath with exertion for many years.  She has been diagnosed with spirometry for > 8 years.  Patient is NOT taking any inhalers routinely at the time of this evaluation.  She takes albuterol PRN. Spirometry evaluation with pre- and post-bronchodilator reveals obstruction, rated as moderate -severe with reversibility.  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 Lum Babe, PCP, and we decided to treat with Triple therapy based on, spiromtery, CAT score and exacerbations in the past.    -Initiated Trelegy Ellipta.  -Educated patient on purpose, proper use, potential adverse effects including the risk of esophageal candidiasis and need to rinse mouth after each use.   -Reviewed results of pulmonary function tests.  Pt verbalized understanding of results and education.    Chronic tobacco abuse - currently mild.  Patient willing and motivated to quit smoking in the upcoming weeks.  She verbalized understanding that her lungs were limiting her activities and she expressed a strong desire to  avoid oxygen therapy. Quit date in the next 10 days.  - Plan phone follow-up of tobacco cessation in 10 days.  Reassess progress at that time.    Refilled recently prescribed Nasonex.  Requested that prescription be resent as her pharmacy did not communicate that they had filled this prescription. Refill sent.   Cancelled Spiriva Handihaler with call to pharmacy.   Written pt instructions provided.  F/U Clinic visit with PCP in 1 week.    Total time in face to face counseling 35  minutes.  Patient seen with Laverna Peace, PharmD - PGY-1 Resident.

## 2020-09-18 ENCOUNTER — Ambulatory Visit (HOSPITAL_COMMUNITY)
Admission: RE | Admit: 2020-09-18 | Discharge: 2020-09-18 | Disposition: A | Discharge status: Home / Self Care | Payer: Medicare Other | Source: Ambulatory Visit | Attending: Family Medicine | Admitting: Family Medicine

## 2020-09-18 DIAGNOSIS — G4489 Other headache syndrome: Secondary | ICD-10-CM

## 2020-09-18 MED ORDER — IOHEXOL 350 MG/ML SOLN
75.0000 mL | Freq: Once | INTRAVENOUS | Status: AC | PRN
  Administered 2020-09-18: 75 mL via INTRAVENOUS

## 2020-09-19 ENCOUNTER — Encounter: Payer: Self-pay | Admitting: Family Medicine

## 2020-09-20 ENCOUNTER — Other Ambulatory Visit: Payer: Self-pay | Admitting: Family Medicine

## 2020-09-20 MED ORDER — VENLAFAXINE HCL ER 150 MG PO CP24
150.0000 mg | ORAL_CAPSULE | Freq: Every day | ORAL | 1 refills | Status: DC
Start: 2020-09-20 — End: 2021-03-17

## 2020-09-24 ENCOUNTER — Ambulatory Visit: Payer: Medicare Other | Admitting: Family Medicine

## 2020-09-27 ENCOUNTER — Other Ambulatory Visit: Payer: Self-pay

## 2020-09-27 ENCOUNTER — Ambulatory Visit (INDEPENDENT_AMBULATORY_CARE_PROVIDER_SITE_OTHER): Payer: Medicare Other | Admitting: Family Medicine

## 2020-09-27 ENCOUNTER — Encounter: Payer: Self-pay | Admitting: Family Medicine

## 2020-09-27 VITALS — BP 112/62 | HR 82 | Ht 65.0 in | Wt 184.2 lb

## 2020-09-27 DIAGNOSIS — F418 Other specified anxiety disorders: Secondary | ICD-10-CM

## 2020-09-27 DIAGNOSIS — Z720 Tobacco use: Secondary | ICD-10-CM

## 2020-09-27 DIAGNOSIS — K219 Gastro-esophageal reflux disease without esophagitis: Secondary | ICD-10-CM

## 2020-09-27 DIAGNOSIS — I10 Essential (primary) hypertension: Secondary | ICD-10-CM

## 2020-09-27 DIAGNOSIS — R7989 Other specified abnormal findings of blood chemistry: Secondary | ICD-10-CM | POA: Diagnosis not present

## 2020-09-27 DIAGNOSIS — J42 Unspecified chronic bronchitis: Secondary | ICD-10-CM | POA: Diagnosis not present

## 2020-09-27 DIAGNOSIS — J302 Other seasonal allergic rhinitis: Secondary | ICD-10-CM

## 2020-09-27 DIAGNOSIS — R519 Headache, unspecified: Secondary | ICD-10-CM

## 2020-09-27 MED ORDER — IPRATROPIUM BROMIDE 0.03 % NA SOLN: 2.0000 | Freq: Two times a day (BID) | NASAL | 12 refills | Status: DC

## 2020-09-27 MED ORDER — FAMOTIDINE 20 MG PO TABS: 20.0000 mg | ORAL_TABLET | Freq: Every day | ORAL | 1 refills | Status: DC

## 2020-09-27 NOTE — Assessment & Plan Note (Signed)
Pepcid escribed. She is aware of the switch from Omeprazole to Pedcid.

## 2020-09-27 NOTE — Progress Notes (Signed)
    SUBJECTIVE:   CHIEF COMPLAINT / HPI:   Depression:Her mood improved since her Effexor dose went up from 75 mg to 150 mg QD for three days.  Headache:Her headache improved a lot on Effexor 150 mg qd. No concerns today.  COPD/Smoking: One cigarette in the last week. She is on Trelegy daily for her COPD and she feels she is breathing much better.  GERD:Off medication for months. Her symptoms is returning. She will like a refill of her med. She was on Omeprazole in the past.  Seasonal allergy/Allergic rhinitis: Still issues with this. Unable to afford her Nasonex.  HTN: Compliant with meds. Here for f/u.  PERTINENT  PMH / PSH: PMX reviewed.  OBJECTIVE:   BP 112/62   Pulse 82   Ht 5\' 5"  (1.651 m)   Wt 184 lb 4 oz (83.6 kg)   SpO2 99%   BMI 30.66 kg/m   Physical Exam Vitals and nursing note reviewed.  Constitutional:      Appearance: Normal appearance.  Cardiovascular:     Rate and Rhythm: Normal rate and regular rhythm.     Heart sounds: Normal heart sounds. No murmur heard.   Pulmonary:     Effort: Pulmonary effort is normal. No respiratory distress.     Breath sounds: Normal breath sounds. No wheezing or rhonchi.  Abdominal:     General: Abdomen is flat. Bowel sounds are normal. There is no distension.     Palpations: Abdomen is soft. There is no mass.  Musculoskeletal:     Right lower leg: No edema.     Left lower leg: No edema.  Psychiatric:        Mood and Affect: Mood normal.        Behavior: Behavior normal.      Office Visit from 09/27/2020 in [facility]  PHQ-9 Total Score 9        ASSESSMENT/PLAN:   Depression with anxiety Improved on Effexor 150 mg qd. Monitor closely.  Headache, occipital Recent CT head normal. Improved on increased dose of Effexor. Monitor closely.  COPD (chronic obstructive pulmonary disease) (HCC) PFT report reviewed. Dr. Borgarnes note reviewed. She is doing well on Trelegy. Albuterol  prn. Monitor for improvement.  Tobacco abuse She is motivated to quit. I commended her on this.  Hypertension BP looks good today.  GERD (gastroesophageal reflux disease) Pepcid escribed. She is aware of the switch from Omeprazole to Pedcid.   Seasonal allergies Unable to afford Nasonex. I will escribed Ipratropium nasal spray instead.     Macky Lower, MD [facility]

## 2020-09-27 NOTE — Assessment & Plan Note (Signed)
Improved on Effexor 150 mg qd. Monitor closely.

## 2020-09-27 NOTE — Patient Instructions (Signed)
Thyroid-Stimulating Hormone Test Why am I having this test? You may have a thyroid-stimulating hormone (TSH) test if you have possible symptoms of abnormal thyroid hormone levels. This test can help your health care provider:  Diagnose a disorder of the thyroid gland or pituitary gland.  Manage your condition and treatment if you have an underactive thyroid (hypothyroidism) or an overactive thyroid (hyperthyroidism). Newborn babies may have this test done to screen for hypothyroidism that is present at birth (congenital). The thyroid is a gland in the lower front of the neck. It makes hormones that affect many body parts and systems, including the system that affects how quickly the body burns fuel for energy (metabolism). The pituitary gland is located just below the brain, behind the eyes and nasal passages. It helps maintain thyroid hormone levels and thyroid gland function. What is being tested? This test measures the amount of TSH in your blood. TSH may also be called thyrotropin. When the thyroid does not make enough hormones, the pituitary gland releases TSH into the bloodstream to stimulate the thyroid gland to make more hormones. What kind of sample is taken?     A blood sample is required for this test. It is usually collected by inserting a needle into a blood vessel. For newborns, a small amount of blood may be collected from the umbilical cord, or by using a small needle to prick the baby's heel (heel stick). Tell a health care provider about:  All medicines you are taking, including vitamins, herbs, eye drops, creams, and over-the-counter medicines.  Any blood disorders you have.  Any surgeries you have had.  Any medical conditions you have.  Whether you are pregnant or may be pregnant. How are the results reported? Your test results will be reported as a value that indicates how much TSH is in your blood. Your health care provider will compare your results to normal ranges  that were established after testing a large group of people (reference ranges). Reference ranges may vary among labs and hospitals. For this test, common reference ranges are:  Adult: 2-10 microunits/mL or 2-10 milliunits/L.  Newborn: ? Heel stick: 3-18 microunits/mL or 3-18 milliunits/L. ? Umbilical cord: 3-12 microunits/mL or 3-12 milliunits/L. What do the results mean? Results that are within the reference range are considered normal. This means that you have a normal amount of TSH in your blood. Results that are higher than the reference range mean that your TSH levels are too high. This may mean:  Your thyroid gland is not making enough thyroid hormones.  Your thyroid medicine dosage is too low.  You have a tumor on your pituitary gland. This is rare. Results that are lower than the reference range mean that your TSH levels are too low. This may be caused by hyperthyroidism or by a problem with the pituitary gland function. Talk with your health care provider about what your results mean. Questions to ask your health care provider Ask your health care provider, or the department that is doing the test:  When will my results be ready?  How will I get my results?  What are my treatment options?  What other tests do I need?  What are my next steps? Summary  You may have a thyroid-stimulating hormone (TSH) test if you have possible symptoms of abnormal thyroid hormone levels.  The thyroid is a gland in the lower front of the neck. It makes hormones that affect many body parts and systems.  The pituitary gland is located   just below the brain, behind the eyes and nasal passages. It helps maintain thyroid hormone levels and thyroid gland function.  This test measures the amount of TSH in your blood. TSH is made by the pituitary gland. It may also be called thyrotropin. This information is not intended to replace advice given to you by your health care provider. Make sure you  discuss any questions you have with your health care provider. Document Revised: 03/14/2018 Document Reviewed: 08/17/2017 Elsevier Patient Education  2020 Elsevier Inc.  

## 2020-09-27 NOTE — Assessment & Plan Note (Signed)
She is motivated to quit. I commended her on this.

## 2020-09-27 NOTE — Assessment & Plan Note (Signed)
PFT report reviewed. Dr. Macky Lower note reviewed. She is doing well on Trelegy. Albuterol prn. Monitor for improvement.

## 2020-09-27 NOTE — Assessment & Plan Note (Signed)
Recent CT head normal. Improved on increased dose of Effexor. Monitor closely.

## 2020-09-27 NOTE — Assessment & Plan Note (Signed)
Unable to afford Nasonex. I will escribed Ipratropium nasal spray instead.

## 2020-09-27 NOTE — Assessment & Plan Note (Signed)
BP looks good today. 

## 2020-10-02 ENCOUNTER — Encounter: Payer: Self-pay | Admitting: Family Medicine

## 2020-10-03 ENCOUNTER — Telehealth: Payer: Self-pay | Admitting: Pharmacist

## 2020-10-03 MED ORDER — FLUTICASONE PROPIONATE 50 MCG/ACT NA SUSP: 2.0000 | Freq: Every day | NASAL | 1 refills | Status: DC

## 2020-10-03 NOTE — Telephone Encounter (Signed)
I attempted to call patient - 10/7 and requested call back.  Left HIPAA compliant message.   Patient returned call and we discussed breathing and tobacco cessation.  Patient reported that she has been smoke free for 5 days.  She also shared that the new inhaler (trelegy) has helped her breath deeper than she has in a long time.  She stated the co-pay was only $4.  She also stated the Nasonex was $40 and she asked me about alternatives.  We dicussed Fluticasone (Flonase) and she was interested in this as an alternative.  I agreed to order this new inhaled steroid inhaler. We discussed using this nasal inhaler for her congestion 1-2 sprays 1-2X per day.  New prescription provided.   We discussed the triggers of smoking outside including when driving.  She shared a commitment to being a long-term ex-smoker.  She is not using any tobacco cessation medications at this time.   We agreed on a two week phone follow-up.  I congratulated and encouraged her successful quit from tobacco.

## 2020-10-04 ENCOUNTER — Telehealth: Payer: Self-pay | Admitting: Family Medicine

## 2020-10-04 NOTE — Telephone Encounter (Signed)
Noted and agree. 

## 2020-10-04 NOTE — Telephone Encounter (Signed)
I discussed her TFT result with her. Her Free T4 is still pending. It is difficult to interpret the result at this moment.  ?? Subclinical hypothyroidism, but the Total T3 is mildly elevated, almost normal.  As discussed with her, we will wait on the Free T4 result for further interpretation.  We will likely need to repeat test in about 4 weeks.  She agreed with the plan.

## 2020-10-07 LAB — TSH+T3+FREE T4+T3 FREE
Free T-3: 3.4 pg/mL
Free T4 by Dialysis: 0.97 ng/dL
TSH: 7.3 uU/mL — ABNORMAL HIGH
Triiodothyronine (T-3), Serum: 171 ng/dL — ABNORMAL HIGH

## 2020-10-08 ENCOUNTER — Encounter: Payer: Self-pay | Admitting: Family Medicine

## 2020-10-31 ENCOUNTER — Telehealth: Payer: Self-pay | Admitting: Pharmacist

## 2020-10-31 NOTE — Telephone Encounter (Signed)
Attempted to contact patient RE tobacco cessation.   No answer again - left message requesting call back to direct line. I will attempt one more time in 1 week.

## 2020-11-03 NOTE — Telephone Encounter (Signed)
I called and spoke with [facility] regarding thyroid test f/u. Appointment made with me for 11/05/20 @ 10:50 PM. She was appreciative of the call.

## 2020-11-04 ENCOUNTER — Encounter: Payer: Self-pay | Admitting: Family Medicine

## 2020-11-05 ENCOUNTER — Other Ambulatory Visit: Payer: Self-pay

## 2020-11-05 ENCOUNTER — Encounter: Payer: Self-pay | Admitting: Family Medicine

## 2020-11-05 ENCOUNTER — Ambulatory Visit (INDEPENDENT_AMBULATORY_CARE_PROVIDER_SITE_OTHER): Payer: Medicare Other | Admitting: Family Medicine

## 2020-11-05 VITALS — BP 110/60 | HR 93 | Ht 65.0 in | Wt 189.0 lb

## 2020-11-05 DIAGNOSIS — J3489 Other specified disorders of nose and nasal sinuses: Secondary | ICD-10-CM | POA: Diagnosis not present

## 2020-11-05 DIAGNOSIS — R0689 Other abnormalities of breathing: Secondary | ICD-10-CM | POA: Diagnosis not present

## 2020-11-05 DIAGNOSIS — E039 Hypothyroidism, unspecified: Secondary | ICD-10-CM

## 2020-11-05 DIAGNOSIS — R5383 Other fatigue: Secondary | ICD-10-CM | POA: Diagnosis not present

## 2020-11-05 NOTE — Progress Notes (Signed)
    SUBJECTIVE:   CHIEF COMPLAINT / HPI:   Fatigue:Patient stated in the last few days, she has not had interest in doing anything, all she does is stay in her couch and rest. She denies feeling of depression, she has some underlying anxiety. Her apartment has some group activity which she does not like to participate on due to her social anxiety,  Abnormal thyroid function: Feeling fatigued as above. No other concerns.  Nasal obstruction: Patients stated that she occasionally feel like there is something blocking her breathing through her nostril. She had a similar issue years ago due to septum deviation for which she got it surgically repaired. She wondered if this is happening again.   PERTINENT  PMH / PSH: PMX reviewed  OBJECTIVE:   BP 110/60   Pulse 93   Ht 5\' 5"  (1.651 m)   Wt 189 lb (85.7 kg)   SpO2 95%   BMI 31.45 kg/m   Physical Exam Vitals and nursing note reviewed.  HENT:     Nose: No nasal deformity, congestion or rhinorrhea.     Right Nostril: No foreign body or occlusion.     Left Nostril: No foreign body or occlusion.     Right Turbinates: Not swollen.     Left Turbinates: Not swollen.  Cardiovascular:     Rate and Rhythm: Normal rate and regular rhythm.     Heart sounds: Normal heart sounds. No murmur heard.   Pulmonary:     Effort: Pulmonary effort is normal. No respiratory distress.     Breath sounds: Normal breath sounds. No wheezing or rhonchi.  Abdominal:     General: Abdomen is flat. Bowel sounds are normal. There is no distension.     Palpations: Abdomen is soft. There is no mass.     Tenderness: There is no abdominal tenderness.  Musculoskeletal:     Right lower leg: No edema.     Left lower leg: No edema.      Office Visit from 11/05/2020 in [facility]  PHQ-9 Total Score 9        ASSESSMENT/PLAN:   Fatigue On going for months. Differentials include depression, thyroid dysfunction, vs pharmacologic  agents. Recent CBC check was negative for anemia. B12 normal. We discussed checking her vitamin D level, however, since she does not have a supporting diagnosis, I was unable to order it. I also checked with lab tech for help, but was unable to order it as well. She will contact her insurance for coverage. Recheck Thyroid function today. Graded exercise discussed. Continue current regimen for depression. She declined Psychotherapy in addition to her current meds. F/U soon if there is no improvement.    Thyroid dysfunction: May be contributing to her fatigue. TFT rechecked today.  Hx of nasal septum deviation: Intermittent difficulty breathing through nostril without URI symptoms. I discussed referral back to ENT for reassessment. She agreed with the plan. Referral paced.  Borgarnes, MD [facility]

## 2020-11-05 NOTE — Assessment & Plan Note (Addendum)
On going for months. Differentials include depression, thyroid dysfunction, vs pharmacologic agents. Recent CBC check was negative for anemia. B12 normal. We discussed checking her vitamin D level, however, since she does not have a supporting diagnosis, I was unable to order it. I also checked with lab tech for help, but was unable to order it as well. She will contact her insurance for coverage. Recheck Thyroid function today. Graded exercise discussed. Continue current regimen for depression. She declined Psychotherapy in addition to her current meds. F/U soon if there is no improvement.

## 2020-11-05 NOTE — Patient Instructions (Signed)

## 2020-11-07 ENCOUNTER — Telehealth: Payer: Self-pay | Admitting: Pharmacist

## 2020-11-07 NOTE — Telephone Encounter (Signed)
Attempted to contact patient RE tobacco cessation.   No answer, left message requesting return call to assess if abstinent from smoking and breathing.

## 2020-11-08 ENCOUNTER — Telehealth: Payer: Self-pay | Admitting: Pharmacist

## 2020-11-08 ENCOUNTER — Encounter: Payer: Self-pay | Admitting: Family Medicine

## 2020-11-08 ENCOUNTER — Telehealth: Payer: Self-pay | Admitting: Family Medicine

## 2020-11-08 MED ORDER — LEVOTHYROXINE SODIUM 25 MCG PO TABS: 25.0000 ug | ORAL_TABLET | ORAL | 1 refills | Status: DC

## 2020-11-08 NOTE — Telephone Encounter (Signed)
I called and went over her test result with her - suggestive of subclinical hypothyroidism.  Free T4 is still pending at this time.  Regardless, treatment is appropriate given her symptoms. I gave her an option to watch and repeat test later vs treatment.  She opted for low dose Synthroid for now.  Med Escribed.

## 2020-11-08 NOTE — Telephone Encounter (Signed)
Noted and agree. 

## 2020-11-08 NOTE — Telephone Encounter (Signed)
Contacted patient RE tobacco reduction / cessation.    Patient reports continued intake of ~ 1 cigarette per week.  She admits this is mostly after meals and with stress.  She continues to have significant trigger for craving a cigarette when driving.  She did not have desire to set a quit date in the next 6 weeks.   She reports the Trelegy Ellipta has improved her ability to take a deep breath.   She reports continued nasal congestion despite the trial of Fluticasone nasal.  She shared that she has been referred to ENT specialist for additional work-up.   Following some discussion we agreed that a 4 week follow-up call would be reasonable.  At that time, reassess for potential quit date in the near future.

## 2020-11-12 ENCOUNTER — Encounter: Payer: Self-pay | Admitting: Family Medicine

## 2020-11-12 LAB — T4+FREE T4
Free T4 by Dialysis: 1.2 ng/dL
Thyroxine (T4): 7.5 ug/dL

## 2020-11-12 LAB — TSH: TSH: 7.75 u[IU]/mL — ABNORMAL HIGH (ref 0.450–4.500)

## 2020-11-12 LAB — T3, FREE: T3, Free: 4.1 pg/mL (ref 2.0–4.4)

## 2020-11-13 ENCOUNTER — Other Ambulatory Visit: Payer: Self-pay | Admitting: Family Medicine

## 2020-12-18 ENCOUNTER — Encounter: Payer: Self-pay | Admitting: Family Medicine

## 2020-12-20 ENCOUNTER — Other Ambulatory Visit: Payer: Self-pay | Admitting: Family Medicine

## 2020-12-25 ENCOUNTER — Telehealth: Payer: Self-pay | Admitting: Pharmacist

## 2020-12-25 NOTE — Telephone Encounter (Signed)
I called to check on the patient, she stated that starting January 1 due to her new insurance plan she will be seeing her new PCP.I updated her chart to reflect this.  She also c/o cough for a few days, feels like she is developing bronchitis. I encouraged her to contact our front office to schedule an appointment ([facility] clinic/virtual appointment). ED precaution discussed.

## 2020-12-25 NOTE — Telephone Encounter (Signed)
Contacted patient RE tobacco cessation.   Patient reports that she is currently waiting in a mile long line at the [facility] to obtain a COVID test.  She reports a sore throat last week and feeling sweaty for the last several days. She worries that it could possibly be COVID OR bronchitis.    She reports "back-sliding" over the holidays.  She admits to smoking 2-3 per day over the holidays but also shared that she is not smoking everyday.  She would like to decrease to 1 cigarette per week again in the immediate future.   She is not prepared to set a date for complete cessation at this time.   She also shared that she has selected a Medicare Advantage Plan that was "to good to be true" which will require her to change providers.  She is transitioning care to Dr. Dorinda Hill on [address] [facility]).  We briefly discussed the use of the [place] quit line (1800-Quit Now) as an option for support over the next few weeks.   Due to a change in her Insurance/ PCP status, I will no longer plan to follow with her as she will not be a patient of our practice at the start of next week.  She thanked me for the support.  I wished her success with her quit attempt.

## 2020-12-26 ENCOUNTER — Telehealth (INDEPENDENT_AMBULATORY_CARE_PROVIDER_SITE_OTHER): Payer: Medicare Other | Admitting: Family Medicine

## 2020-12-26 ENCOUNTER — Encounter (HOSPITAL_COMMUNITY): Payer: Self-pay

## 2020-12-26 ENCOUNTER — Emergency Department (HOSPITAL_COMMUNITY)
Admission: EM | Admit: 2020-12-26 | Discharge: 2020-12-26 | Discharge status: Against Medical Advice | Payer: Medicare Other | Attending: Emergency Medicine | Admitting: Emergency Medicine

## 2020-12-26 ENCOUNTER — Other Ambulatory Visit: Payer: Self-pay

## 2020-12-26 DIAGNOSIS — J441 Chronic obstructive pulmonary disease with (acute) exacerbation: Secondary | ICD-10-CM

## 2020-12-26 DIAGNOSIS — J449 Chronic obstructive pulmonary disease, unspecified: Secondary | ICD-10-CM | POA: Diagnosis not present

## 2020-12-26 DIAGNOSIS — Z5321 Procedure and treatment not carried out due to patient leaving prior to being seen by health care provider: Secondary | ICD-10-CM | POA: Insufficient documentation

## 2020-12-26 DIAGNOSIS — R059 Cough, unspecified: Secondary | ICD-10-CM | POA: Diagnosis present

## 2020-12-26 MED ORDER — PREDNISONE 20 MG PO TABS: 40.0000 mg | ORAL_TABLET | Freq: Every day | ORAL | 0 refills | Status: AC

## 2020-12-26 MED ORDER — HYDROCOD POLST-CPM POLST ER 10-8 MG/5ML PO SUER
5.0000 mL | Freq: Two times a day (BID) | ORAL | 0 refills | Status: DC | PRN
Start: 2020-12-26 — End: 2022-12-16

## 2020-12-26 MED ORDER — AZITHROMYCIN 250 MG PO TABS: ORAL_TABLET | ORAL | 0 refills | Status: DC

## 2020-12-26 NOTE — ED Triage Notes (Signed)
Patient has a history of COPD. Patient c/o a non productive cough x 12/26 21. Cough worse when she lies down.

## 2020-12-26 NOTE — Assessment & Plan Note (Signed)
Patient with known history of severe COPD.  Given her symptoms and pending COVID test, will go ahead and treat her for COPD exacerbation with azithromycin x5d, pred 40mg  x5d, continued use of inhalers.  Also advised that no cough medication has been shown to be helpful and for patient to try honey again, but she is insists that only tussionex helps, therefore will prescribed.  PMP reviewed without red flags.  Advised patient to quarantine as if she has COVID pending her results.  She will await them and is aware to remain in isolation if positive for at least 5 days with symptom improvement, or full 10 days if continued symptoms.  If she has worsening in symptoms, specifically in shortness of breath, she was advised to present to ED or UC for examination over the weekend.  If no improvement in symptoms with treatment, should return to care.

## 2020-12-26 NOTE — Progress Notes (Signed)
[facility] Telemedicine Visit  Patient consented to have virtual visit and was identified by name and date of birth. Method of visit: Telephone  Encounter participants: Patient: Shelly Frazier - located at home Provider: Unknown Jim - located at home Others (if applicable): none  Chief Complaint: cough, rhinorrhea  HPI:  Shelly Frazier is a 64 y.o. female who presents today to discuss the following:  Cough, Rhinorrhea States that she started to feel ill on 12/25 and 12/26 Endorses sore throat that is now resolved, but persistent rhinorrhea, cough, congestion, myalgias States that her cough is dry but that her chest feels like it is rattling and something needs to come up Has not had any fevers Got a COVID test yesterday but hasn't gotten result back yet She has been using her rescue inhaler and trelegy, working to quit smoking, down to about 1-2 cigarettes a week now She went to [facility] ED today to be seen, had vitals, but left afterwards due to long wait, no fever at that time Overall, states that she feels like she does when she gets bronchitis and is concerned that this will worsen over the weekend without treatment Also states that she would like to be prescribed Tussionex as this is the only thing that helps her cough and that she has tried many things over the counter, tessalon perles, and honey, but none of them help Gets intermittently SOB at baseline, states that her breathing is at her baseline currently  ROS: per HPI  Pertinent PMHx: Severe COPD, HTN, HLD, Tobacco Use, Depression  Exam:  There were no vitals taken for this visit.  Respiratory: speaking in complete sentences, no evidence of respiratory distress over the phone  Assessment/Plan:  COPD with acute exacerbation [facility]) Patient with known history of severe COPD.  Given her symptoms and pending COVID test, will go ahead and treat her for COPD exacerbation with azithromycin  x5d, pred 40mg  x5d, continued use of inhalers.  Also advised that no cough medication has been shown to be helpful and for patient to try honey again, but she is insists that only tussionex helps, therefore will prescribed.  PMP reviewed without red flags.  Advised patient to quarantine as if she has COVID pending her results.  She will await them and is aware to remain in isolation if positive for at least 5 days with symptom improvement, or full 10 days if continued symptoms.  If she has worsening in symptoms, specifically in shortness of breath, she was advised to present to ED or UC for examination over the weekend.  If no improvement in symptoms with treatment, should return to care.    Time spent during visit with patient: 13 minutes

## 2020-12-30 NOTE — Telephone Encounter (Signed)
Noted and agree. 

## 2021-01-04 ENCOUNTER — Encounter: Payer: Self-pay | Admitting: Family Medicine

## 2021-01-10 ENCOUNTER — Other Ambulatory Visit: Payer: Medicare Other

## 2021-01-10 DIAGNOSIS — Z20822 Contact with and (suspected) exposure to covid-19: Secondary | ICD-10-CM

## 2021-01-12 LAB — NOVEL CORONAVIRUS, NAA: SARS-CoV-2, NAA: NOT DETECTED

## 2021-01-12 LAB — SARS-COV-2, NAA 2 DAY TAT

## 2021-02-18 ENCOUNTER — Other Ambulatory Visit: Payer: Self-pay | Admitting: Family Medicine

## 2021-03-17 ENCOUNTER — Other Ambulatory Visit: Payer: Self-pay | Admitting: Family Medicine

## 2021-04-22 ENCOUNTER — Other Ambulatory Visit: Payer: Self-pay | Admitting: Internal Medicine

## 2021-04-22 DIAGNOSIS — Z1231 Encounter for screening mammogram for malignant neoplasm of breast: Secondary | ICD-10-CM

## 2021-04-28 ENCOUNTER — Other Ambulatory Visit: Payer: Self-pay | Admitting: Internal Medicine

## 2021-04-28 DIAGNOSIS — Z720 Tobacco use: Secondary | ICD-10-CM

## 2021-04-28 DIAGNOSIS — Z13820 Encounter for screening for osteoporosis: Secondary | ICD-10-CM

## 2021-05-01 ENCOUNTER — Other Ambulatory Visit: Payer: Self-pay | Admitting: Family Medicine

## 2021-06-02 ENCOUNTER — Ambulatory Visit: Payer: Medicare Other

## 2021-06-03 ENCOUNTER — Other Ambulatory Visit: Payer: Medicare Other

## 2021-06-07 ENCOUNTER — Ambulatory Visit
Admission: RE | Admit: 2021-06-07 | Discharge: 2021-06-07 | Disposition: A | Discharge status: Home / Self Care | Payer: Medicare Other | Source: Ambulatory Visit | Attending: Internal Medicine | Admitting: Internal Medicine

## 2021-06-07 DIAGNOSIS — Z1231 Encounter for screening mammogram for malignant neoplasm of breast: Secondary | ICD-10-CM

## 2021-06-11 ENCOUNTER — Other Ambulatory Visit: Payer: Self-pay | Admitting: Internal Medicine

## 2021-06-11 DIAGNOSIS — R928 Other abnormal and inconclusive findings on diagnostic imaging of breast: Secondary | ICD-10-CM

## 2021-06-12 ENCOUNTER — Other Ambulatory Visit: Payer: Self-pay

## 2021-06-12 ENCOUNTER — Ambulatory Visit
Admission: RE | Admit: 2021-06-12 | Discharge: 2021-06-12 | Disposition: A | Discharge status: Home / Self Care | Payer: Medicare Other | Source: Ambulatory Visit | Attending: Internal Medicine | Admitting: Internal Medicine

## 2021-06-12 DIAGNOSIS — Z13820 Encounter for screening for osteoporosis: Secondary | ICD-10-CM

## 2021-06-26 ENCOUNTER — Other Ambulatory Visit: Payer: Self-pay | Admitting: Family Medicine

## 2021-07-04 ENCOUNTER — Other Ambulatory Visit: Payer: Medicare Other

## 2021-07-05 ENCOUNTER — Other Ambulatory Visit: Payer: Self-pay | Admitting: Family Medicine

## 2021-07-23 ENCOUNTER — Emergency Department (HOSPITAL_COMMUNITY): Payer: Medicare Other

## 2021-07-23 ENCOUNTER — Other Ambulatory Visit: Payer: Medicare Other

## 2021-07-23 ENCOUNTER — Emergency Department (HOSPITAL_COMMUNITY)
Admission: EM | Admit: 2021-07-23 | Discharge: 2021-07-23 | Disposition: A | Discharge status: Home / Self Care | Payer: Medicare Other | Attending: Emergency Medicine | Admitting: Emergency Medicine

## 2021-07-23 ENCOUNTER — Encounter (HOSPITAL_COMMUNITY): Payer: Self-pay | Admitting: *Deleted

## 2021-07-23 ENCOUNTER — Other Ambulatory Visit: Payer: Self-pay

## 2021-07-23 DIAGNOSIS — J441 Chronic obstructive pulmonary disease with (acute) exacerbation: Secondary | ICD-10-CM | POA: Diagnosis not present

## 2021-07-23 DIAGNOSIS — F17210 Nicotine dependence, cigarettes, uncomplicated: Secondary | ICD-10-CM | POA: Insufficient documentation

## 2021-07-23 DIAGNOSIS — Z7951 Long term (current) use of inhaled steroids: Secondary | ICD-10-CM | POA: Insufficient documentation

## 2021-07-23 DIAGNOSIS — Z79899 Other long term (current) drug therapy: Secondary | ICD-10-CM | POA: Insufficient documentation

## 2021-07-23 DIAGNOSIS — I10 Essential (primary) hypertension: Secondary | ICD-10-CM | POA: Diagnosis not present

## 2021-07-23 DIAGNOSIS — R059 Cough, unspecified: Secondary | ICD-10-CM | POA: Diagnosis present

## 2021-07-23 DIAGNOSIS — J40 Bronchitis, not specified as acute or chronic: Secondary | ICD-10-CM | POA: Insufficient documentation

## 2021-07-23 MED ORDER — PREDNISONE 20 MG PO TABS: 40.0000 mg | ORAL_TABLET | Freq: Every day | ORAL | 0 refills | Status: AC

## 2021-07-23 MED ORDER — IPRATROPIUM BROMIDE HFA 17 MCG/ACT IN AERS
2.0000 | INHALATION_SPRAY | Freq: Once | RESPIRATORY_TRACT | Status: AC
  Administered 2021-07-23: 2 via RESPIRATORY_TRACT
  Filled 2021-07-23: qty 12.9

## 2021-07-23 MED ORDER — AZITHROMYCIN 250 MG PO TABS: ORAL_TABLET | ORAL | 0 refills | Status: DC

## 2021-07-23 MED ORDER — ALBUTEROL SULFATE HFA 108 (90 BASE) MCG/ACT IN AERS
4.0000 | INHALATION_SPRAY | Freq: Once | RESPIRATORY_TRACT | Status: AC
  Administered 2021-07-23: 4 via RESPIRATORY_TRACT
  Filled 2021-07-23: qty 6.7

## 2021-07-23 MED ORDER — HYDROCODONE BIT-HOMATROP MBR 5-1.5 MG/5ML PO SOLN
5.0000 mL | Freq: Four times a day (QID) | ORAL | 0 refills | Status: DC | PRN
Start: 2021-07-23 — End: 2022-01-03

## 2021-07-23 NOTE — ED Triage Notes (Signed)
Pt w/ hx of COPD feels like she has bronchitis x 5 days. She has been coughing, short of breath.

## 2021-07-23 NOTE — Discharge Instructions (Addendum)
  You were evaluated in the Emergency Department and after careful evaluation, we did not find any emergent condition requiring admission or further testing in the hospital.   Your exam/testing today was overall reassuring.  Symptoms seem to be due to bronchitis.  Please follow-up with your primary care in the next couple of days.  You can use the albuterol inhaler 2 puffs every 6 hours mucus Atrovent 2 puffs at night as we discussed.  Please take the other medications as recommended. Please return to the Emergency Department if you experience any worsening of your condition.  Thank you for allowing [place] to be a part of your care. Please speak to your pharmacist about any new medications prescribed today in regards to side effects or interactions with other medications.

## 2021-07-23 NOTE — ED Provider Notes (Signed)
[facility]-EMERGENCY DEPT Provider Note   CSN: [id number] Arrival date & time: 07/23/21  1609     History Chief Complaint  Patient presents with   Cough    Shelly Frazier is a 65 y.o. female with prior past medical history of bronchitis, COPD, hypertension, tobacco abuse who presents emerged department today for coughing and shortness of breath.  Patient states that she has had bronchitis for the past 5 days and cannot sleep due to the coughing.  Patient states that she often has bronchitis with her COPD, has gotten better since she quit smoking however states that she started smoking on Saturday which is when she started having symptoms of bronchitis.  Patient states that she smoked multiple cigarettes back-to-back on Saturday after her sister passed away.  Patient states that she has not been able to shake the cough, denies any mucus.  Denies any hemoptysis.  Denies any congestion, fevers, nausea, vomiting, diarrhea, anosmia, sore throat.  Patient states that she had been seen in February and December 2020 and was given medications that severely helped her here.  Patient does not feel like her shortness of breath is severe, only has it when she coughs.  Denies any chest pain.  Denies any myalgias.  Patient has been taking her albuterol inhaler without relief.  Denies any wheezing.  HPI     Past Medical History:  Diagnosis Date   Anxiety    Bronchitis    Chest pain    COPD (chronic obstructive pulmonary disease) (HCC)    Elevated fasting glucose    GERD (gastroesophageal reflux disease)    Hyperlipemia    Hypertension    Insomnia 04/13/2014   Nausea without vomiting 04/03/2016   Neck pain 04/03/2016   Obesity    Stress incontinence 08/31/2014   Tobacco abuse    Ulnar tunnel syndrome of left wrist 01/12/2018    Patient Active Problem List   Diagnosis Date Noted   Fatigue 09/03/2020   Seasonal allergies 05/31/2018   Headache, occipital 08/20/2015   Obesity,  unspecified 08/10/2014   Depression with anxiety 10/27/2013   Tobacco abuse 09/29/2012   COPD with acute exacerbation (HCC) 09/28/2012   GERD (gastroesophageal reflux disease) 03/29/2012   Hyperlipidemia 03/09/2012   Hypertension 03/08/2012    Past Surgical History:  Procedure Laterality Date   ABDOMINAL HYSTERECTOMY     endometrial, heavy bleeding   BACK SURGERY  1978   lumpectomy left breast     benign   reconstructive surgeries on forehead     sinus surgery  1987   TUBAL LIGATION       OB History   No obstetric history on file.     Family History  Problem Relation Age of Onset   Cancer Mother [id number]       peritoneal cancer   Heart disease Mother        bypass   Hypertension Mother    Cancer Father [id number]       lung ca   Depression Sister    Alcohol abuse Sister    Liver disease Sister    Depression Sister    Stroke Maternal Grandmother    Heart disease Maternal Grandmother    Heart disease Maternal Grandfather    Heart disease Paternal Grandmother    Heart disease Paternal Grandfather    Diabetes Neg Hx     Social History   Tobacco Use   Smoking status: Light Smoker    Packs/day: 0.50  Years: 43.00    Pack years: 21.50    Types: Cigarettes    Last attempt to quit: 11/03/2012    Years since quitting: 8.7   Smokeless tobacco: Never  Vaping Use   Vaping Use: Never used  Substance Use Topics   Alcohol use: No   Drug use: No    Home Medications Prior to Admission medications   Medication Sig Start Date End Date Taking? Authorizing Provider  HYDROcodone bit-homatropine (HYCODAN) 5-1.5 MG/5ML syrup Take 5 mLs by mouth every 6 (six) hours as needed for cough. 07/23/21  Yes Farrel Gordon, PA-C  predniSONE (DELTASONE) 20 MG tablet Take 2 tablets (40 mg total) by mouth daily for 5 days. 07/23/21 07/28/21 Yes Gay Rape, PA-C  albuterol (VENTOLIN HFA) 108 (90 Base) MCG/ACT inhaler TAKE 2 PUFFS BY MOUTH EVERY 6 HOURS AS NEEDED FOR WHEEZE OR SHORTNESS OF BREATH  09/03/20   Doreene Eland, MD  aspirin 81 MG tablet Take 81 mg by mouth daily.    [provider]  atorvastatin (LIPITOR) 20 MG tablet TAKE 1 TABLET BY MOUTH EVERYDAY AT BEDTIME 05/01/21   Doreene Eland, MD  azithromycin (ZITHROMAX) 250 MG tablet Take 2 tablets on day one, then one tablet a day for the remainder of treatment. 07/23/21   Farrel Gordon, PA-C  carvedilol (COREG) 6.25 MG tablet TAKE 1 TABLET BY MOUTH (TWO) TIMES DAILY WITH A MEAL. 12/23/20   Doreene Eland, MD  chlorpheniramine-HYDROcodone (TUSSIONEX PENNKINETIC ER) 10-8 MG/5ML SUER Take 5 mLs by mouth every 12 (twelve) hours as needed for cough. 12/26/20   Meccariello, Solmon Ice, DO  dicyclomine (BENTYL) 20 MG tablet TAKE 1 TABLET BY MOUTH TWICE A DAY 09/03/20   Doreene Eland, MD  famotidine (PEPCID) 20 MG tablet Take 1 tablet (20 mg total) by mouth at bedtime. 09/27/20   Doreene Eland, MD  fluticasone (FLONASE) 50 MCG/ACT nasal spray Place 2 sprays into both nostrils daily. 10/03/20   Kathrin Ruddy, RPH-CPP  Fluticasone-Umeclidin-Vilant (TRELEGY ELLIPTA) 100-62.5-25 MCG/INH AEPB Inhale 1 Dose into the lungs daily. 09/17/20   Kathrin Ruddy, RPH-CPP  hydrochlorothiazide (HYDRODIURIL) 25 MG tablet TAKE 1 TABLET BY MOUTH EVERYDAY AT BEDTIME 12/23/20   Janit Pagan T, MD  ibuprofen (ADVIL,MOTRIN) 200 MG tablet Take 200 mg by mouth every 6 (six) hours as needed for mild pain.  Patient not taking: Reported on 09/27/2020    [provider]  ipratropium (ATROVENT) 0.03 % nasal spray Place 2 sprays into both nostrils every 12 (twelve) hours. 09/27/20   Doreene Eland, MD  levocetirizine (XYZAL) 5 MG tablet TAKE 1 TABLET BY MOUTH EVERY DAY IN THE EVENING 09/03/20   Doreene Eland, MD  levothyroxine (SYNTHROID) 25 MCG tablet TAKE 1 TABLET (25 MCG TOTAL) BY MOUTH EVERY MORNING. 30 MINUTES BEFORE FOOD 05/01/21   Doreene Eland, MD  nitroGLYCERIN (NITROSTAT) 0.4 MG SL tablet Place 1 tablet (0.4 mg total) under the  tongue every 5 (five) minutes as needed for chest pain. Do not exceed 3 doses. Patient not taking: Reported on 05/31/2018 12/02/16   Doreene Eland, MD  promethazine (PHENERGAN) 25 MG tablet TAKE 1 TABLET (25 MG TOTAL) BY MOUTH EVERY 8 (EIGHT) HOURS AS NEEDED FOR NAUSEA OR VOMITING. 11/13/20   Doreene Eland, MD  venlafaxine XR (EFFEXOR-XR) 150 MG 24 hr capsule TAKE 1 CAPSULE BY MOUTH EVERY DAY 03/17/21   Doreene Eland, MD  Zoster Vaccine Adjuvanted [facility]) injection Give one dose now and  the second dose 2-6 months after the first dose. Patient not taking: Reported on 09/17/2020 09/03/20   Doreene Eland, MD    Allergies    Naproxen, Aspirin, and Codeine  Review of Systems   Review of Systems  Constitutional:  Negative for chills, diaphoresis, fatigue and fever.  HENT:  Negative for congestion, sore throat and trouble swallowing.   Eyes:  Negative for pain and visual disturbance.  Respiratory:  Positive for cough and shortness of breath. Negative for wheezing.   Cardiovascular:  Negative for chest pain, palpitations and leg swelling.  Gastrointestinal:  Negative for abdominal distention, abdominal pain, diarrhea, nausea and vomiting.  Genitourinary:  Negative for difficulty urinating.  Musculoskeletal:  Negative for back pain, neck pain and neck stiffness.  Skin:  Negative for pallor.  Neurological:  Negative for dizziness, speech difficulty, weakness and headaches.  Psychiatric/Behavioral:  Negative for confusion.    Physical Exam Updated Vital Signs BP 105/77 (BP Location: Right Arm)   Pulse 83   Temp 98.1 F (36.7 C) (Oral)   Resp 18   SpO2 92%   Physical Exam Constitutional:      General: She is not in acute distress.    Appearance: Normal appearance. She is not ill-appearing, toxic-appearing or diaphoretic.  HENT:     Mouth/Throat:     Mouth: Mucous membranes are moist.     Pharynx: Oropharynx is clear.  Eyes:     General: No scleral icterus.    Extraocular  Movements: Extraocular movements intact.     Pupils: Pupils are equal, round, and reactive to light.  Cardiovascular:     Rate and Rhythm: Normal rate and regular rhythm.     Pulses: Normal pulses.     Heart sounds: Normal heart sounds.  Pulmonary:     Effort: Pulmonary effort is normal. No respiratory distress.     Breath sounds: Normal breath sounds. No stridor. No wheezing, rhonchi or rales.  Chest:     Chest wall: No tenderness.  Abdominal:     General: Abdomen is flat. There is no distension.     Palpations: Abdomen is soft.     Tenderness: There is no abdominal tenderness. There is no guarding or rebound.  Musculoskeletal:        General: No swelling or tenderness. Normal range of motion.     Cervical back: Normal range of motion and neck supple. No rigidity.     Right lower leg: No edema.     Left lower leg: No edema.  Skin:    General: Skin is warm and dry.     Capillary Refill: Capillary refill takes less than 2 seconds.     Coloration: Skin is not pale.  Neurological:     General: No focal deficit present.     Mental Status: She is alert and oriented to person, place, and time.  Psychiatric:        Mood and Affect: Mood normal.        Behavior: Behavior normal.    ED Results / Procedures / Treatments   Labs (all labs ordered are listed, but only abnormal results are displayed) Labs Reviewed - No data to display  EKG None  Radiology DG Chest Portable 1 View  Result Date: 07/23/2021 CLINICAL DATA:  Cough.  History of COPD.  Shortness of breath EXAM: PORTABLE CHEST 1 VIEW COMPARISON:  02/12/2019 FINDINGS: Chronic hyperinflation. Chronic central bronchial thickening. No evidence of focal airspace disease. Biapical pleuroparenchymal scarring is similar to prior.  Minimal peripheral scarring at the right lung base. No visualized pulmonary mass or nodule. No pulmonary edema, pleural effusion, or pneumothorax. No acute osseous abnormalities are seen. IMPRESSION: 1. Chronic  hyperinflation and central bronchial thickening, imaging findings consistent with COPD. 2. No evidence of superimposed acute process. Electronically Signed   By: Narda Rutherford M.D.   On: 07/23/2021 19:00    Procedures Procedures   Medications Ordered in ED Medications  ipratropium (ATROVENT HFA) inhaler 2 puff (2 puffs Inhalation Given 07/23/21 1833)  albuterol (VENTOLIN HFA) 108 (90 Base) MCG/ACT inhaler 4 puff (4 puffs Inhalation Given 07/23/21 1833)    ED Course  I have reviewed the triage vital signs and the nursing notes.  Pertinent labs & imaging results that were available during my care of the patient were reviewed by me and considered in my medical decision making (see chart for details).    MDM Rules/Calculators/A&P                          Patient presents to the emergency department today for cough for the past 5 days and some slight shortness of breath while coughing after relapsing on cigarettes.  Patient appears extremely well, no respiratory distress, vitals are stable.  Lung exam is benign, will obtain chest x-ray and give inhalers at this time.  Patient does not want nebulizer treatment, states that she has that at home and can do that there.  Patient looks extremely well, do not think that labs are necessary at this time.  Upon reevaluation patient states that she feels much better, does not feel short of breath anymore.  EKG interpreted by me does not appear to have any ischemia, chest x-ray interpreted me shows chronic COPD changes, no acute infection.  Patient to be discharged with medications that she had last time which significantly improved including prednisone, Hycodan, Z-Pak.  Patient to be discharged at this time, will follow up with PCP.  The patient was counseled on the dangers of tobacco use, and was advised to quit.  Reviewed strategies to maximize success, including removing cigarettes and smoking materials from environment, stress management, substitution  of other forms of reinforcement, support of family/friends and written materials. Total time was 5 min CPT code 02542.    Doubt need for further emergent work up at this time. I explained the diagnosis and have given explicit precautions to return to the ER including for any other new or worsening symptoms. The patient understands and accepts the medical plan as it's been dictated and I have answered their questions. Discharge instructions concerning home care and prescriptions have been given. The patient is STABLE and is discharged to home in good condition.   Final Clinical Impression(s) / ED Diagnoses Final diagnoses:  Bronchitis  COPD with acute exacerbation [facility])    Rx / DC Orders ED Discharge Orders          Ordered    azithromycin (ZITHROMAX) 250 MG tablet        07/23/21 1817    predniSONE (DELTASONE) 20 MG tablet  Daily        07/23/21 1817    HYDROcodone bit-homatropine (HYCODAN) 5-1.5 MG/5ML syrup  Every 6 hours PRN        07/23/21 1817             Farrel Gordon, PA-C 07/23/21 1926    Horton, Clabe Seal, DO 07/23/21 2303

## 2021-07-28 ENCOUNTER — Other Ambulatory Visit: Payer: Self-pay

## 2021-07-28 ENCOUNTER — Ambulatory Visit
Admission: RE | Admit: 2021-07-28 | Discharge: 2021-07-28 | Disposition: A | Discharge status: Home / Self Care | Payer: Medicare Other | Source: Ambulatory Visit | Attending: Internal Medicine | Admitting: Internal Medicine

## 2021-07-28 DIAGNOSIS — R928 Other abnormal and inconclusive findings on diagnostic imaging of breast: Secondary | ICD-10-CM

## 2021-08-06 ENCOUNTER — Encounter (HOSPITAL_COMMUNITY): Payer: Self-pay | Admitting: Emergency Medicine

## 2021-08-06 ENCOUNTER — Emergency Department (HOSPITAL_COMMUNITY)
Admission: EM | Admit: 2021-08-06 | Discharge: 2021-08-07 | Disposition: A | Discharge status: Home / Self Care | Payer: Medicare Other | Attending: Emergency Medicine | Admitting: Emergency Medicine

## 2021-08-06 ENCOUNTER — Other Ambulatory Visit: Payer: Self-pay

## 2021-08-06 ENCOUNTER — Emergency Department (HOSPITAL_COMMUNITY): Payer: Medicare Other

## 2021-08-06 DIAGNOSIS — Z7951 Long term (current) use of inhaled steroids: Secondary | ICD-10-CM | POA: Insufficient documentation

## 2021-08-06 DIAGNOSIS — Z7982 Long term (current) use of aspirin: Secondary | ICD-10-CM | POA: Diagnosis not present

## 2021-08-06 DIAGNOSIS — N200 Calculus of kidney: Secondary | ICD-10-CM | POA: Insufficient documentation

## 2021-08-06 DIAGNOSIS — I10 Essential (primary) hypertension: Secondary | ICD-10-CM | POA: Diagnosis not present

## 2021-08-06 DIAGNOSIS — F17210 Nicotine dependence, cigarettes, uncomplicated: Secondary | ICD-10-CM | POA: Diagnosis not present

## 2021-08-06 DIAGNOSIS — J449 Chronic obstructive pulmonary disease, unspecified: Secondary | ICD-10-CM | POA: Diagnosis not present

## 2021-08-06 DIAGNOSIS — R1031 Right lower quadrant pain: Secondary | ICD-10-CM | POA: Diagnosis present

## 2021-08-06 DIAGNOSIS — K219 Gastro-esophageal reflux disease without esophagitis: Secondary | ICD-10-CM | POA: Diagnosis not present

## 2021-08-06 DIAGNOSIS — Z79899 Other long term (current) drug therapy: Secondary | ICD-10-CM | POA: Insufficient documentation

## 2021-08-06 DIAGNOSIS — R109 Unspecified abdominal pain: Secondary | ICD-10-CM

## 2021-08-06 LAB — CBC WITH DIFFERENTIAL/PLATELET
Abs Immature Granulocytes: 0.07 10*3/uL (ref 0.00–0.07)
Basophils Absolute: 0 10*3/uL (ref 0.0–0.1)
Basophils Relative: 0 %
Eosinophils Absolute: 0.1 10*3/uL (ref 0.0–0.5)
Eosinophils Relative: 1 %
HCT: 45.8 % (ref 36.0–46.0)
Hemoglobin: 15.5 g/dL — ABNORMAL HIGH (ref 12.0–15.0)
Immature Granulocytes: 1 %
Lymphocytes Relative: 13 %
Lymphs Abs: 1.7 10*3/uL (ref 0.7–4.0)
MCH: 29.9 pg (ref 26.0–34.0)
MCHC: 33.8 g/dL (ref 30.0–36.0)
MCV: 88.4 fL (ref 80.0–100.0)
Monocytes Absolute: 0.6 10*3/uL (ref 0.1–1.0)
Monocytes Relative: 4 %
Neutro Abs: 10.9 10*3/uL — ABNORMAL HIGH (ref 1.7–7.7)
Neutrophils Relative %: 81 %
Platelets: 311 10*3/uL (ref 150–400)
RBC: 5.18 MIL/uL — ABNORMAL HIGH (ref 3.87–5.11)
RDW: 13.6 % (ref 11.5–15.5)
WBC: 13.4 10*3/uL — ABNORMAL HIGH (ref 4.0–10.5)
nRBC: 0 % (ref 0.0–0.2)

## 2021-08-06 LAB — COMPREHENSIVE METABOLIC PANEL
ALT: 25 U/L (ref 0–44)
AST: 22 U/L (ref 15–41)
Albumin: 4.1 g/dL (ref 3.5–5.0)
Alkaline Phosphatase: 116 U/L (ref 38–126)
Anion gap: 12 (ref 5–15)
BUN: 19 mg/dL (ref 8–23)
CO2: 29 mmol/L (ref 22–32)
Calcium: 9.6 mg/dL (ref 8.9–10.3)
Chloride: 97 mmol/L — ABNORMAL LOW (ref 98–111)
Creatinine, Ser: 0.78 mg/dL (ref 0.44–1.00)
GFR, Estimated: >60 mL/min (ref >60)
Glucose, Bld: 160 mg/dL — ABNORMAL HIGH (ref 70–99)
Potassium: 3.2 mmol/L — ABNORMAL LOW (ref 3.5–5.1)
Sodium: 138 mmol/L (ref 135–145)
Total Bilirubin: 0.7 mg/dL (ref 0.3–1.2)
Total Protein: 7.6 g/dL (ref 6.5–8.1)

## 2021-08-06 LAB — LIPASE, BLOOD: Lipase: 70 U/L — ABNORMAL HIGH (ref 11–51)

## 2021-08-06 MED ORDER — FENTANYL CITRATE (PF) 100 MCG/2ML IJ SOLN
100.0000 ug | Freq: Once | INTRAMUSCULAR | Status: AC
  Administered 2021-08-06: 100 ug via INTRAVENOUS
  Filled 2021-08-06: qty 2

## 2021-08-06 MED ORDER — PROMETHAZINE HCL 25 MG/ML IJ SOLN
12.5000 mg | Freq: Four times a day (QID) | INTRAMUSCULAR | Status: DC | PRN
  Administered 2021-08-06: 12.5 mg via INTRAMUSCULAR
  Filled 2021-08-06: qty 1

## 2021-08-06 MED ORDER — ONDANSETRON HCL 4 MG/2ML IJ SOLN
4.0000 mg | Freq: Once | INTRAMUSCULAR | Status: AC
  Administered 2021-08-06: 4 mg via INTRAVENOUS
  Filled 2021-08-06: qty 2

## 2021-08-06 MED ORDER — SODIUM CHLORIDE 0.9 % IV BOLUS (SEPSIS)
1000.0000 mL | Freq: Once | INTRAVENOUS | Status: AC
  Administered 2021-08-06: 1000 mL via INTRAVENOUS

## 2021-08-06 NOTE — ED Triage Notes (Signed)
Around 4 pm today the patient noticed pain in RLQ of her abdomin that radiates to her back and nausea. No position provides pain relief. She also has been unable to eat anything since that time.

## 2021-08-06 NOTE — ED Provider Notes (Signed)
Emergency Medicine Provider Triage Evaluation Note  Shelly Frazier , a 65 y.o. female  was evaluated in triage.  Pt complains of right lower quadrant pain, right flank pain, right back pain, nausea, vomiting x5 hours.  Pain is constant, severe.  Not alleviated by anything, worsened by movement.  No history of kidney stones, no recent UTIs, no previous abdominal surgeries.  Review of Systems  Positive: right lower quadrant pain, right flank pain, right back pain, nausea, vomiting Negative: Dysuria, hematuria  Physical Exam  BP (!) 134/98 (BP Location: Left Arm)   Pulse 84   Temp 97.8 F (36.6 C) (Oral)   Resp 18   SpO2 99%  Gen:   Awake, no distress. Tearful Resp:  Normal effort  MSK:   Moves extremities without difficulty  Other:  Positive CVA tenderness, right lower quadrant tenderness.  Medical Decision Making  Medically screening exam initiated at 8:13 PM.  Appropriate orders placed.  Towanda Malkin was informed that the remainder of the evaluation will be completed by another provider, this initial triage assessment does not replace that evaluation, and the importance of remaining in the ED until their evaluation is complete.     Theron Arista, PA-C 08/06/21 2017    Mancel Bale, MD 08/06/21 [phone]

## 2021-08-07 DIAGNOSIS — N200 Calculus of kidney: Secondary | ICD-10-CM | POA: Diagnosis not present

## 2021-08-07 LAB — URINALYSIS, ROUTINE W REFLEX MICROSCOPIC
Bacteria, UA: NONE SEEN
Bilirubin Urine: NEGATIVE
Glucose, UA: NEGATIVE mg/dL
Ketones, ur: NEGATIVE mg/dL
Nitrite: NEGATIVE
Protein, ur: 30 mg/dL — AB
Specific Gravity, Urine: 1.025 (ref 1.005–1.030)
pH: 5 (ref 5.0–8.0)

## 2021-08-07 MED ORDER — ONDANSETRON HCL 4 MG/2ML IJ SOLN
4.0000 mg | Freq: Once | INTRAMUSCULAR | Status: AC
  Administered 2021-08-07: 4 mg via INTRAVENOUS
  Filled 2021-08-07: qty 2

## 2021-08-07 MED ORDER — HYDROCODONE-ACETAMINOPHEN 5-325 MG PO TABS: 1.0000 | ORAL_TABLET | Freq: Four times a day (QID) | ORAL | 0 refills | Status: DC | PRN

## 2021-08-07 MED ORDER — HYDROCODONE-ACETAMINOPHEN 5-325 MG PO TABS
2.0000 | ORAL_TABLET | Freq: Once | ORAL | Status: AC
  Administered 2021-08-07: 2 via ORAL
  Filled 2021-08-07: qty 2

## 2021-08-07 MED ORDER — KETOROLAC TROMETHAMINE 30 MG/ML IJ SOLN
15.0000 mg | Freq: Once | INTRAMUSCULAR | Status: AC
  Administered 2021-08-07: 15 mg via INTRAVENOUS
  Filled 2021-08-07: qty 1

## 2021-08-07 MED ORDER — SODIUM CHLORIDE 0.9 % IV BOLUS (SEPSIS)
1000.0000 mL | Freq: Once | INTRAVENOUS | Status: AC
  Administered 2021-08-07: 1000 mL via INTRAVENOUS

## 2021-08-07 NOTE — ED Provider Notes (Signed)
[facility]-EMERGENCY DEPT Provider Note   CSN: [id number] Arrival date & time: 08/06/21  1959     History Chief Complaint  Patient presents with   Abdominal Pain   Nausea    Shelly Frazier is a 65 y.o. female.  The history is provided by the patient.  Abdominal Pain Pain location:  R flank and RLQ Pain quality: aching and sharp   Pain severity:  Severe Onset quality:  Sudden Duration:  8 hours Timing:  Constant Progression:  Worsening Chronicity:  New Relieved by:  Nothing Worsened by:  Movement and palpation Associated symptoms: nausea and vomiting   Associated symptoms: no chest pain, no dysuria and no fever      Patient with history of anxiety, COPD presents with abdominal and flank pain.  Patient reports sudden onset of right flank and right lower quad abdominal pain.  She also reports onset of nausea and vomiting.  No dysuria.  No previous history of kidney stones or UTIs.  Previous abdominal surgery includes hysterectomy Past Medical History:  Diagnosis Date   Anxiety    Bronchitis    Chest pain    COPD (chronic obstructive pulmonary disease) (HCC)    Elevated fasting glucose    GERD (gastroesophageal reflux disease)    Hyperlipemia    Hypertension    Insomnia 04/13/2014   Nausea without vomiting 04/03/2016   Neck pain 04/03/2016   Obesity    Stress incontinence 08/31/2014   Tobacco abuse    Ulnar tunnel syndrome of left wrist 01/12/2018    Patient Active Problem List   Diagnosis Date Noted   Fatigue 09/03/2020   Seasonal allergies 05/31/2018   Headache, occipital 08/20/2015   Obesity, unspecified 08/10/2014   Depression with anxiety 10/27/2013   Tobacco abuse 09/29/2012   COPD with acute exacerbation (HCC) 09/28/2012   GERD (gastroesophageal reflux disease) 03/29/2012   Hyperlipidemia 03/09/2012   Hypertension 03/08/2012    Past Surgical History:  Procedure Laterality Date   ABDOMINAL HYSTERECTOMY     endometrial, heavy bleeding    BACK SURGERY  1978   lumpectomy left breast     benign   reconstructive surgeries on forehead     sinus surgery  1987   TUBAL LIGATION       OB History   No obstetric history on file.     Family History  Problem Relation Age of Onset   Cancer Mother 82       peritoneal cancer   Heart disease Mother        bypass   Hypertension Mother    Cancer Father 18       lung ca   Depression Sister    Alcohol abuse Sister    Liver disease Sister    Depression Sister    Stroke Maternal Grandmother    Heart disease Maternal Grandmother    Heart disease Maternal Grandfather    Heart disease Paternal Grandmother    Heart disease Paternal Grandfather    Diabetes Neg Hx     Social History   Tobacco Use   Smoking status: Light Smoker    Packs/day: 0.50    Years: 43.00    Pack years: 21.50    Types: Cigarettes    Last attempt to quit: 11/03/2012    Years since quitting: 8.7   Smokeless tobacco: Never  Vaping Use   Vaping Use: Never used  Substance Use Topics   Alcohol use: No   Drug use: No  Home Medications Prior to Admission medications   Medication Sig Start Date End Date Taking? Authorizing Provider  HYDROcodone-acetaminophen (NORCO/VICODIN) 5-325 MG tablet Take 1 tablet by mouth every 6 (six) hours as needed. 08/07/21  Yes Zadie Rhine, MD  albuterol (VENTOLIN HFA) 108 (90 Base) MCG/ACT inhaler TAKE 2 PUFFS BY MOUTH EVERY 6 HOURS AS NEEDED FOR WHEEZE OR SHORTNESS OF BREATH 09/03/20   Doreene Eland, MD  aspirin 81 MG tablet Take 81 mg by mouth daily.    [provider]  atorvastatin (LIPITOR) 20 MG tablet TAKE 1 TABLET BY MOUTH EVERYDAY AT BEDTIME 05/01/21   Doreene Eland, MD  azithromycin (ZITHROMAX) 250 MG tablet Take 2 tablets on day one, then one tablet a day for the remainder of treatment. 07/23/21   Farrel Gordon, PA-C  carvedilol (COREG) 6.25 MG tablet TAKE 1 TABLET BY MOUTH (TWO) TIMES DAILY WITH A MEAL. 12/23/20   Doreene Eland, MD   chlorpheniramine-HYDROcodone (TUSSIONEX PENNKINETIC ER) 10-8 MG/5ML SUER Take 5 mLs by mouth every 12 (twelve) hours as needed for cough. 12/26/20   Meccariello, Solmon Ice, DO  dicyclomine (BENTYL) 20 MG tablet TAKE 1 TABLET BY MOUTH TWICE A DAY 09/03/20   Doreene Eland, MD  famotidine (PEPCID) 20 MG tablet Take 1 tablet (20 mg total) by mouth at bedtime. 09/27/20   Doreene Eland, MD  fluticasone (FLONASE) 50 MCG/ACT nasal spray Place 2 sprays into both nostrils daily. 10/03/20   Kathrin Ruddy, RPH-CPP  Fluticasone-Umeclidin-Vilant (TRELEGY ELLIPTA) 100-62.5-25 MCG/INH AEPB Inhale 1 Dose into the lungs daily. 09/17/20   Kathrin Ruddy, RPH-CPP  hydrochlorothiazide (HYDRODIURIL) 25 MG tablet TAKE 1 TABLET BY MOUTH EVERYDAY AT BEDTIME 12/23/20   Janit Pagan T, MD  HYDROcodone bit-homatropine (HYCODAN) 5-1.5 MG/5ML syrup Take 5 mLs by mouth every 6 (six) hours as needed for cough. 07/23/21   Farrel Gordon, PA-C  ibuprofen (ADVIL,MOTRIN) 200 MG tablet Take 200 mg by mouth every 6 (six) hours as needed for mild pain.  Patient not taking: Reported on 09/27/2020    [provider]  ipratropium (ATROVENT) 0.03 % nasal spray Place 2 sprays into both nostrils every 12 (twelve) hours. 09/27/20   Doreene Eland, MD  levocetirizine (XYZAL) 5 MG tablet TAKE 1 TABLET BY MOUTH EVERY DAY IN THE EVENING 09/03/20   Doreene Eland, MD  levothyroxine (SYNTHROID) 25 MCG tablet TAKE 1 TABLET (25 MCG TOTAL) BY MOUTH EVERY MORNING. 30 MINUTES BEFORE FOOD 05/01/21   Doreene Eland, MD  nitroGLYCERIN (NITROSTAT) 0.4 MG SL tablet Place 1 tablet (0.4 mg total) under the tongue every 5 (five) minutes as needed for chest pain. Do not exceed 3 doses. Patient not taking: Reported on 05/31/2018 12/02/16   Doreene Eland, MD  promethazine (PHENERGAN) 25 MG tablet TAKE 1 TABLET (25 MG TOTAL) BY MOUTH EVERY 8 (EIGHT) HOURS AS NEEDED FOR NAUSEA OR VOMITING. 11/13/20   Doreene Eland, MD  venlafaxine XR (EFFEXOR-XR)  150 MG 24 hr capsule TAKE 1 CAPSULE BY MOUTH EVERY DAY 03/17/21   Doreene Eland, MD  Zoster Vaccine Adjuvanted [facility]) injection Give one dose now and the second dose 2-6 months after the first dose. Patient not taking: Reported on 09/17/2020 09/03/20   Doreene Eland, MD    Allergies    Naproxen, Aspirin, and Codeine  Review of Systems   Review of Systems  Constitutional:  Negative for fever.  Cardiovascular:  Negative for chest pain.  Gastrointestinal:  Positive for  abdominal pain, nausea and vomiting.  Genitourinary:  Negative for dysuria.  All other systems reviewed and are negative.  Physical Exam Updated Vital Signs BP (!) 96/54   Pulse 71   Temp 97.8 F (36.6 C) (Oral)   Resp 16   SpO2 95%   Physical Exam CONSTITUTIONAL: Elderly, uncomfortable HEAD: Normocephalic/atraumatic EYES: EOMI/PERRL ENMT: Mucous membranes moist NECK: supple no meningeal signs SPINE/BACK:entire spine nontender CV: S1/S2 noted, no murmurs/rubs/gallops noted LUNGS: Lungs are clear to auscultation bilaterally, no apparent distress ABDOMEN: soft, mild RLQ tenderness, no rebound or guarding, bowel sounds noted throughout abdomen GU:no cva tenderness NEURO: Pt is awake/alert/appropriate, moves all extremitiesx4.  No facial droop.   EXTREMITIES: pulses normal/equal, full ROM SKIN: warm, color normal PSYCH: no abnormalities of mood noted, alert and oriented to situation  ED Results / Procedures / Treatments   Labs (all labs ordered are listed, but only abnormal results are displayed) Labs Reviewed  CBC WITH DIFFERENTIAL/PLATELET - Abnormal; Notable for the following components:      Result Value   WBC 13.4 (*)    RBC 5.18 (*)    Hemoglobin 15.5 (*)    Neutro Abs 10.9 (*)    All other components within normal limits  COMPREHENSIVE METABOLIC PANEL - Abnormal; Notable for the following components:   Potassium 3.2 (*)    Chloride 97 (*)    Glucose, Bld 160 (*)    All other components  within normal limits  LIPASE, BLOOD - Abnormal; Notable for the following components:   Lipase 70 (*)    All other components within normal limits  URINALYSIS, ROUTINE W REFLEX MICROSCOPIC - Abnormal; Notable for the following components:   Color, Urine AMBER (*)    APPearance TURBID (*)    Hgb urine dipstick SMALL (*)    Protein, ur 30 (*)    Leukocytes,Ua TRACE (*)    All other components within normal limits  URINE CULTURE    EKG None  Radiology CT Renal Stone Study  Result Date: 08/06/2021 CLINICAL DATA:  Right lower quadrant/right flank pain EXAM: CT ABDOMEN AND PELVIS WITHOUT CONTRAST TECHNIQUE: Multidetector CT imaging of the abdomen and pelvis was performed following the standard protocol without IV contrast. COMPARISON:  None. FINDINGS: Lower chest: Lung bases are clear. Hepatobiliary: Unenhanced liver is unremarkable. Gallbladder is unremarkable. No intrahepatic or extrahepatic duct dilatation. Pancreas: Within normal limits. Spleen: Within normal limits. Adrenals/Urinary Tract: Adrenal glands are within normal limits. 3 mm nonobstructing left upper pole renal calculus (series 2/image 29). Right kidney is within normal limits. No hydronephrosis. Bladder is underdistended but unremarkable. Stomach/Bowel: Stomach is notable for a small hiatal hernia. No evidence of bowel obstruction. Normal appendix (series 2/image 65). Mild sigmoid diverticulosis, without evidence of diverticulitis. Vascular/Lymphatic: No evidence of abdominal aortic aneurysm. Atherosclerotic calcifications of the abdominal aorta and branch vessels. No suspicious abdominopelvic lymphadenopathy. Reproductive: Status post hysterectomy. Bilateral ovaries are within normal limits. Other: No abdominopelvic ascites. Musculoskeletal: Mild degenerative changes of the lumbar spine. IMPRESSION: 3 mm nonobstructing left upper pole renal calculus. No hydronephrosis. No evidence of bowel obstruction.  Normal appendix. No CT findings  to account for the patient's right lower quadrant abdominal pain. One Electronically Signed   By: Charline Bills M.D.   On: 08/06/2021 23:42    Procedures Procedures   Medications Ordered in ED Medications  fentaNYL (SUBLIMAZE) injection 100 mcg (100 mcg Intravenous Given 08/06/21 2342)  ondansetron (ZOFRAN) injection 4 mg (4 mg Intravenous Given 08/06/21 2342)  sodium chloride 0.9 %  bolus 1,000 mL (0 mLs Intravenous Stopped 08/07/21 0059)  ketorolac (TORADOL) 30 MG/ML injection 15 mg (15 mg Intravenous Given 08/07/21 0059)  sodium chloride 0.9 % bolus 1,000 mL (0 mLs Intravenous Stopped 08/07/21 0230)  HYDROcodone-acetaminophen (NORCO/VICODIN) 5-325 MG per tablet 2 tablet (2 tablets Oral Given 08/07/21 0304)  ondansetron (ZOFRAN) injection 4 mg (4 mg Intravenous Given 08/07/21 0309)    ED Course  I have reviewed the triage vital signs and the nursing notes.  Pertinent labs & imaging results that were available during my care of the patient were reviewed by me and considered in my medical decision making (see chart for details).    MDM Rules/Calculators/A&P                           This patient presents to the ED for concern of abdominal pain, this involves an extensive number of treatment options, and is a complaint that carries with it a high risk of complications and morbidity.  The differential diagnosis includes appendicitis, UTI, kidney stone, ureteral stone, bowel perforation, bowel obstruction   Lab Tests:  I Ordered, reviewed, and interpreted labs, which included urinalysis, electrolytes, complete blood count,  Medicines ordered:  I ordered medication fentanyl for pain  Imaging Studies ordered:  I ordered imaging studies which included ct renal  I independently visualized and interpreted imaging which showed left renal stone  Additional history obtained:  Previous records obtained and reviewed   Reevaluation:  After the interventions stated above, I reevaluated  the patient and found pt with some improvement  6:28 AM Patient improved, resting comfortably for several hours Suspect she may have had a ureteral stone that she had just passed given the evidence of hematuria.  She also has a nonobstructive stone on the left kidney Her history was consistent with ureteral colic given sudden onset of pain in the flank and abdomen with vomiting.  No signs of appendicitis on CT imaging No signs of UTI or sepsis. Will refer to urology.  She request short course of pain medicine  Final Clinical Impression(s) / ED Diagnoses Final diagnoses:  Flank pain  Kidney stone    Rx / DC Orders ED Discharge Orders          Ordered    HYDROcodone-acetaminophen (NORCO/VICODIN) 5-325 MG tablet  Every 6 hours PRN        08/07/21 [id number]             Zadie RhineWickline, Kadden Osterhout, MD 08/07/21 [phone]

## 2021-08-09 LAB — URINE CULTURE: Culture: >=100000 — AB

## 2021-08-10 NOTE — Progress Notes (Signed)
ED Antimicrobial Stewardship Positive Culture Follow Up   Shelly Frazier is an 65 y.o. female who presented to [facility] on 08/06/2021 with a chief complaint of  Chief Complaint  Patient presents with   Abdominal Pain   Nausea    Recent Results (from the past 720 hour(s))  Urine Culture     Status: Abnormal   Collection Time: 08/07/21 12:19 AM   Specimen: Urine, Clean Catch  Result Value Ref Range Status   Specimen Description   Final    URINE, CLEAN CATCH Performed at [facility], [address], [place], [place] [address]    Special Requests   Final    NONE Performed at [facility], [address], [place], [place] [address]    Culture >=100,000 COLONIES/mL ESCHERICHIA COLI (A)  Final   Report Status 08/09/2021 FINAL  Final   Organism ID, Bacteria ESCHERICHIA COLI (A)  Final      Susceptibility   Escherichia coli - MIC*    AMPICILLIN 4 SENSITIVE Sensitive     CEFAZOLIN <=4 SENSITIVE Sensitive     CEFEPIME <=0.12 SENSITIVE Sensitive     CEFTRIAXONE <=0.25 SENSITIVE Sensitive     CIPROFLOXACIN <=0.25 SENSITIVE Sensitive     GENTAMICIN <=1 SENSITIVE Sensitive     IMIPENEM <=0.25 SENSITIVE Sensitive     NITROFURANTOIN <=16 SENSITIVE Sensitive     TRIMETH/SULFA <=20 SENSITIVE Sensitive     AMPICILLIN/SULBACTAM <=2 SENSITIVE Sensitive     PIP/TAZO <=4 SENSITIVE Sensitive     * >=100,000 COLONIES/mL ESCHERICHIA COLI   Plan: call patient & query for UTI signs & symptoms If no: no treatment indicated If yes: Keflex 500 mg po bid x 5 days ED Provider: Mosetta Putt, PA-C  Herby Abraham, Pharm.D Clinical Pharmacist [phone]

## 2021-08-11 ENCOUNTER — Telehealth: Payer: Self-pay

## 2021-08-11 NOTE — Telephone Encounter (Signed)
Post ED Visit - Positive Culture Follow-up: Unsuccessful Patient Follow-up  Culture assessed and recommendations reviewed by:  []  , Pharm.D. []  Enzo Bi, Pharm.D., BCPS AQ-ID []  , Pharm.D., BCPS []  Celedonio Miyamoto, Pharm.D., BCPS []  [place], Garvin Fila.D., BCPS, AAHIVP []  , Pharm.D., BCPS, AAHIVP []  Georgina Pillion, PharmD []  , PharmD, BCPS  Positive urine culture  Cultures assessed and recommendations reviewed by [place], PA-C  [x]  Patient discharged without antimicrobial prescription and treatment may be indicated Plan: If pt has UTI symptoms then begin Keflex 500mg  po BID for 5 days if no symptoms no treatment is needed.    Unable to contact patient after 3 attempts, letter will be sent to address on file  [address] 08/11/2021, 2:21 PM

## 2021-08-13 ENCOUNTER — Other Ambulatory Visit: Payer: Self-pay | Admitting: Family Medicine

## 2021-08-15 ENCOUNTER — Other Ambulatory Visit: Payer: Self-pay | Admitting: Family Medicine

## 2021-09-21 ENCOUNTER — Other Ambulatory Visit: Payer: Self-pay | Admitting: Family Medicine

## 2021-09-29 ENCOUNTER — Other Ambulatory Visit: Payer: Self-pay | Admitting: Family Medicine

## 2021-10-29 ENCOUNTER — Other Ambulatory Visit: Payer: Self-pay | Admitting: Family Medicine

## 2022-01-03 ENCOUNTER — Emergency Department (HOSPITAL_COMMUNITY)
Admission: EM | Admit: 2022-01-03 | Discharge: 2022-01-03 | Disposition: A | Discharge status: Home / Self Care | Payer: Medicare Other | Attending: Emergency Medicine | Admitting: Emergency Medicine

## 2022-01-03 ENCOUNTER — Other Ambulatory Visit: Payer: Self-pay

## 2022-01-03 ENCOUNTER — Encounter (HOSPITAL_COMMUNITY): Payer: Self-pay

## 2022-01-03 ENCOUNTER — Emergency Department (HOSPITAL_COMMUNITY): Payer: Medicare Other

## 2022-01-03 DIAGNOSIS — I10 Essential (primary) hypertension: Secondary | ICD-10-CM | POA: Insufficient documentation

## 2022-01-03 DIAGNOSIS — Z7982 Long term (current) use of aspirin: Secondary | ICD-10-CM | POA: Insufficient documentation

## 2022-01-03 DIAGNOSIS — R059 Cough, unspecified: Secondary | ICD-10-CM | POA: Diagnosis present

## 2022-01-03 DIAGNOSIS — J209 Acute bronchitis, unspecified: Secondary | ICD-10-CM | POA: Insufficient documentation

## 2022-01-03 DIAGNOSIS — Z79899 Other long term (current) drug therapy: Secondary | ICD-10-CM | POA: Diagnosis not present

## 2022-01-03 DIAGNOSIS — J449 Chronic obstructive pulmonary disease, unspecified: Secondary | ICD-10-CM | POA: Insufficient documentation

## 2022-01-03 MED ORDER — AZITHROMYCIN 250 MG PO TABS: 250.0000 mg | ORAL_TABLET | Freq: Every day | ORAL | 0 refills | Status: DC

## 2022-01-03 MED ORDER — HYDROCODONE BIT-HOMATROP MBR 5-1.5 MG/5ML PO SOLN: 5.0000 mL | Freq: Four times a day (QID) | ORAL | 0 refills | Status: DC | PRN

## 2022-01-03 MED ORDER — PREDNISONE 20 MG PO TABS: 40.0000 mg | ORAL_TABLET | Freq: Every day | ORAL | 0 refills | Status: DC

## 2022-01-03 NOTE — ED Provider Notes (Signed)
[facility] DEPT Provider Note   CSN: [id number] Arrival date & time: 01/03/22  [id number]     History  Chief Complaint  Patient presents with   Cough    Shelly Frazier is a 66 y.o. female with a past medical history significant for COPD, who presents for 1 week of cough, shortness of breath.  Patient reports that she has a history of COPD, uses her home Trelegy, as well as albuterol as needed.  Patient reports that she has a few times yearly acute bronchitis exacerbations and this feels similar.  Patient reports that she has significant cough, especially when lying down, minimal relief with home medications.  She denies significant wheezing.  She denies chest pain, nausea, vomiting, dysuria, hematuria, fever, chills.  Patient denies any significant productive sputum.  Patient reports that she normally receives Z-Pak, prednisone, Hycodan or Tussionex syrup which works every time.   Cough Associated symptoms: shortness of breath       Home Medications Prior to Admission medications   Medication Sig Start Date End Date Taking? Authorizing Provider  azithromycin (ZITHROMAX Z-PAK) 250 MG tablet Take 1 tablet (250 mg total) by mouth daily. 01/03/22  Yes Caelan Atchley H, PA-C  HYDROcodone bit-homatropine (HYCODAN) 5-1.5 MG/5ML syrup Take 5 mLs by mouth every 6 (six) hours as needed for cough. 01/03/22  Yes Jennah Satchell H, PA-C  predniSONE (DELTASONE) 20 MG tablet Take 2 tablets (40 mg total) by mouth daily. 01/03/22  Yes Koran Seabrook H, PA-C  albuterol (VENTOLIN HFA) 108 (90 Base) MCG/ACT inhaler TAKE 2 PUFFS BY MOUTH EVERY 6 HOURS AS NEEDED FOR WHEEZE OR SHORTNESS OF BREATH 09/03/20   Kinnie Feil, MD  aspirin 81 MG tablet Take 81 mg by mouth daily.    [provider]  atorvastatin (LIPITOR) 20 MG tablet TAKE 1 TABLET BY MOUTH EVERYDAY AT BEDTIME 05/01/21   Eniola, Tawanna Solo T, MD  carvedilol (COREG) 6.25 MG tablet TAKE 1 TABLET BY MOUTH (TWO)  TIMES DAILY WITH A MEAL. 12/23/20   Kinnie Feil, MD  chlorpheniramine-HYDROcodone (TUSSIONEX PENNKINETIC ER) 10-8 MG/5ML SUER Take 5 mLs by mouth every 12 (twelve) hours as needed for cough. 12/26/20   Meccariello, Bernita Raisin, DO  dicyclomine (BENTYL) 20 MG tablet TAKE 1 TABLET BY MOUTH TWICE A DAY 09/03/20   Kinnie Feil, MD  famotidine (PEPCID) 20 MG tablet Take 1 tablet (20 mg total) by mouth at bedtime. 09/27/20   Kinnie Feil, MD  fluticasone (FLONASE) 50 MCG/ACT nasal spray Place 2 sprays into both nostrils daily. 10/03/20   Leavy Cella, RPH-CPP  Fluticasone-Umeclidin-Vilant (TRELEGY ELLIPTA) 100-62.5-25 MCG/INH AEPB Inhale 1 Dose into the lungs daily. 09/17/20   Leavy Cella, RPH-CPP  hydrochlorothiazide (HYDRODIURIL) 25 MG tablet TAKE 1 TABLET BY MOUTH EVERYDAY AT BEDTIME 12/23/20   Kinnie Feil, MD  HYDROcodone-acetaminophen (NORCO/VICODIN) 5-325 MG tablet Take 1 tablet by mouth every 6 (six) hours as needed. 08/07/21   Ripley Fraise, MD  ibuprofen (ADVIL,MOTRIN) 200 MG tablet Take 200 mg by mouth every 6 (six) hours as needed for mild pain.  Patient not taking: Reported on 09/27/2020    [provider]  ipratropium (ATROVENT) 0.03 % nasal spray Place 2 sprays into both nostrils every 12 (twelve) hours. 09/27/20   Kinnie Feil, MD  levocetirizine (XYZAL) 5 MG tablet TAKE 1 TABLET BY MOUTH EVERY DAY IN THE EVENING 09/03/20   Kinnie Feil, MD  levothyroxine (SYNTHROID) 25 MCG tablet TAKE 1  TABLET (25 MCG TOTAL) BY MOUTH EVERY MORNING. [place] 05/01/21   Kinnie Feil, MD  nitroGLYCERIN (NITROSTAT) 0.4 MG SL tablet Place 1 tablet (0.4 mg total) under the tongue every 5 (five) minutes as needed for chest pain. Do not exceed 3 doses. Patient not taking: Reported on 05/31/2018 12/02/16   Kinnie Feil, MD  promethazine (PHENERGAN) 25 MG tablet TAKE 1 TABLET (25 MG TOTAL) BY MOUTH EVERY 8 (EIGHT) HOURS AS NEEDED FOR NAUSEA OR VOMITING. 11/13/20    Kinnie Feil, MD  venlafaxine XR (EFFEXOR-XR) 150 MG 24 hr capsule TAKE 1 CAPSULE BY MOUTH EVERY DAY 03/17/21   Kinnie Feil, MD  Zoster Vaccine Adjuvanted [facility]) injection Give one dose now and the second dose 2-6 months after the first dose. Patient not taking: Reported on 09/17/2020 09/03/20   Kinnie Feil, MD      Allergies    Naproxen, Aspirin, and Codeine    Review of Systems   Review of Systems  Respiratory:  Positive for cough and shortness of breath.   All other systems reviewed and are negative.  Physical Exam Updated Vital Signs BP (!) 188/95 (BP Location: Right Arm)    Pulse 100    Temp 98.6 F (37 C) (Oral)    Resp 20    Ht 5\' 5"  (1.651 m)    Wt 86.2 kg    SpO2 100%    BMI 31.62 kg/m  Physical Exam Vitals and nursing note reviewed.  Constitutional:      General: She is not in acute distress.    Appearance: Normal appearance.     Comments: This is a well-appearing patient in no acute distress  HENT:     Head: Normocephalic and atraumatic.  Eyes:     General:        Right eye: No discharge.        Left eye: No discharge.  Cardiovascular:     Rate and Rhythm: Normal rate and regular rhythm.     Heart sounds: No murmur heard.   No friction rub. No gallop.  Pulmonary:     Effort: Pulmonary effort is normal.     Breath sounds: Normal breath sounds.     Comments: Clear breath sounds throughout, poor excursion at lung bases.  There is minimal end expiratory wheezing on forced expiration, no wheezing otherwise.  She does have a dry hacking cough periodically throughout the exam.  There is no focal consolidation noted.  No rhonchi, rales, crackles. Abdominal:     General: Bowel sounds are normal.     Palpations: Abdomen is soft.     Tenderness: There is no abdominal tenderness.  Skin:    General: Skin is warm and dry.     Capillary Refill: Capillary refill takes less than 2 seconds.  Neurological:     Mental Status: She is alert and oriented to person,  place, and time.  Psychiatric:        Mood and Affect: Mood normal.        Behavior: Behavior normal.    ED Results / Procedures / Treatments   Labs (all labs ordered are listed, but only abnormal results are displayed) Labs Reviewed - No data to display  EKG None  Radiology DG Chest Portable 1 View  Result Date: 01/03/2022 CLINICAL DATA:  66 year old female with increasing cough over the past week. Smoker. EXAM: PORTABLE CHEST 1 VIEW COMPARISON:  Portable chest 07/23/2021 and earlier. FINDINGS: Portable AP view  at 0707 hours. Evidence of chronic pulmonary hyperinflation. Normal cardiac size and mediastinal contours. Visualized tracheal air column is within normal limits. Chronic increased pulmonary interstitial markings are stable since 2020. No pneumothorax, pleural effusion or acute pulmonary opacity. No acute osseous abnormality identified. Paucity of bowel gas in the upper abdomen. IMPRESSION: Chronic pulmonary hyperinflation. No acute cardiopulmonary abnormality. Electronically Signed   By: Genevie Ann M.D.   On: 01/03/2022 07:13    Procedures Procedures    Medications Ordered in ED Medications - No data to display  ED Course/ Medical Decision Making/ A&P                           Medical Decision Making  Overall well-appearing patient with history of COPD presents with 1 week of cough that she reports is keeping her up from sleep.  Patient with history of multiple acute bronchitis exacerbations, and history of COPD which places her at risk for same.  Differential diagnosis includes acute bronchitis, pneumonia, upper respiratory infection, COPD exacerbation, less likely to be PE or ACS based on her current story, and overall risk factors.  Patient has no chest pain, she is maintaining 100 recent oxygen saturation with room air, no recent travel, otherwise stable vital signs other than some hypertension, and mildly elevated heart rate.  Personally ordered and reviewed lab work  including portable chest x-ray which shows hyperinflation, no evidence of focal consolidation.  I agree with radiologist interpretation.  Her EKG shows normal sinus rhythm, this was independently reviewed by my attending Dr. Eulis Foster.  We will discharge with Hycodan, prednisone burst, and azithromycin for presumed acute bronchitis.  I minimal clinical concern for ACS, PE at this time, as patient does not have persistent tachycardia, no focal consolidation, no chest pain.  Discussed extensive return precautions.  Patient discharged in stable condition at this time. Final Clinical Impression(s) / ED Diagnoses Final diagnoses:  Acute bronchitis, unspecified organism    Rx / DC Orders ED Discharge Orders          Ordered    HYDROcodone bit-homatropine (HYCODAN) 5-1.5 MG/5ML syrup  Every 6 hours PRN        01/03/22 1615    predniSONE (DELTASONE) 20 MG tablet  Daily        01/03/22 1615    azithromycin (ZITHROMAX Z-PAK) 250 MG tablet  Daily        01/03/22 1615              Jaida Basurto, Malo H, PA-C 01/03/22 1617    Daleen Bo, MD 01/04/22 [phone]

## 2022-01-03 NOTE — ED Triage Notes (Signed)
Pt reports cough for about a week. Worse when laying down and unable to get any sleep Hx copd.

## 2022-01-03 NOTE — Discharge Instructions (Addendum)
As we discussed your presentation today is consistent with acute bronchitis.  Please use the cough syrup as needed for your cough and cold symptoms, and then please take the prednisone and Z-Pak as prescribed.  Please follow-up if your cough, shortness of breath worsens, you begin develop chest pain, or persistent fever.  Please follow-up with your primary care for further evaluation and recheck.

## 2022-02-24 ENCOUNTER — Other Ambulatory Visit: Payer: Self-pay

## 2022-02-24 ENCOUNTER — Emergency Department (HOSPITAL_COMMUNITY)
Admission: EM | Admit: 2022-02-24 | Discharge: 2022-02-24 | Disposition: A | Discharge status: Home / Self Care | Payer: Medicare Other | Attending: Emergency Medicine | Admitting: Emergency Medicine

## 2022-02-24 ENCOUNTER — Encounter (HOSPITAL_COMMUNITY): Payer: Self-pay | Admitting: *Deleted

## 2022-02-24 ENCOUNTER — Emergency Department (HOSPITAL_COMMUNITY): Payer: Medicare Other

## 2022-02-24 DIAGNOSIS — Z7982 Long term (current) use of aspirin: Secondary | ICD-10-CM | POA: Diagnosis not present

## 2022-02-24 DIAGNOSIS — Z7951 Long term (current) use of inhaled steroids: Secondary | ICD-10-CM | POA: Diagnosis not present

## 2022-02-24 DIAGNOSIS — J441 Chronic obstructive pulmonary disease with (acute) exacerbation: Secondary | ICD-10-CM | POA: Diagnosis not present

## 2022-02-24 DIAGNOSIS — Z79899 Other long term (current) drug therapy: Secondary | ICD-10-CM | POA: Insufficient documentation

## 2022-02-24 DIAGNOSIS — I10 Essential (primary) hypertension: Secondary | ICD-10-CM | POA: Insufficient documentation

## 2022-02-24 DIAGNOSIS — R0602 Shortness of breath: Secondary | ICD-10-CM | POA: Diagnosis present

## 2022-02-24 DIAGNOSIS — E876 Hypokalemia: Secondary | ICD-10-CM | POA: Insufficient documentation

## 2022-02-24 LAB — BASIC METABOLIC PANEL
Anion gap: 14 (ref 5–15)
BUN: 13 mg/dL (ref 8–23)
CO2: 25 mmol/L (ref 22–32)
Calcium: 8.6 mg/dL — ABNORMAL LOW (ref 8.9–10.3)
Chloride: 95 mmol/L — ABNORMAL LOW (ref 98–111)
Creatinine, Ser: 0.76 mg/dL (ref 0.44–1.00)
GFR, Estimated: >60 mL/min (ref >60)
Glucose, Bld: 104 mg/dL — ABNORMAL HIGH (ref 70–99)
Potassium: 2.3 mmol/L — CL (ref 3.5–5.1)
Sodium: 134 mmol/L — ABNORMAL LOW (ref 135–145)

## 2022-02-24 LAB — CBC
HCT: 38.7 % (ref 36.0–46.0)
Hemoglobin: 13.2 g/dL (ref 12.0–15.0)
MCH: 29.9 pg (ref 26.0–34.0)
MCHC: 34.1 g/dL (ref 30.0–36.0)
MCV: 87.8 fL (ref 80.0–100.0)
Platelets: 368 10*3/uL (ref 150–400)
RBC: 4.41 MIL/uL (ref 3.87–5.11)
RDW: 14 % (ref 11.5–15.5)
WBC: 10.9 10*3/uL — ABNORMAL HIGH (ref 4.0–10.5)
nRBC: 0 % (ref 0.0–0.2)

## 2022-02-24 MED ORDER — POTASSIUM CHLORIDE 10 MEQ/100ML IV SOLN
10.0000 meq | Freq: Once | INTRAVENOUS | Status: AC
  Administered 2022-02-24: 10 meq via INTRAVENOUS
  Filled 2022-02-24: qty 100

## 2022-02-24 MED ORDER — IPRATROPIUM-ALBUTEROL 0.5-2.5 (3) MG/3ML IN SOLN
3.0000 mL | Freq: Once | RESPIRATORY_TRACT | Status: AC
Start: 2022-02-24 — End: 2022-02-24
  Administered 2022-02-24: 3 mL via RESPIRATORY_TRACT
  Filled 2022-02-24: qty 3

## 2022-02-24 MED ORDER — HYDROCODONE BIT-HOMATROP MBR 5-1.5 MG/5ML PO SOLN: 5.0000 mL | Freq: Four times a day (QID) | ORAL | 0 refills | Status: AC | PRN

## 2022-02-24 MED ORDER — PREDNISONE 10 MG PO TABS: 40.0000 mg | ORAL_TABLET | Freq: Every day | ORAL | 0 refills | Status: AC

## 2022-02-24 MED ORDER — DOXYCYCLINE HYCLATE 100 MG PO TABS: 100.0000 mg | ORAL_TABLET | Freq: Once | ORAL | Status: DC

## 2022-02-24 MED ORDER — PREDNISONE 20 MG PO TABS
60.0000 mg | ORAL_TABLET | Freq: Once | ORAL | Status: AC
  Administered 2022-02-24: 60 mg via ORAL
  Filled 2022-02-24: qty 3

## 2022-02-24 MED ORDER — POTASSIUM CHLORIDE ER 10 MEQ PO TBCR: 20.0000 meq | EXTENDED_RELEASE_TABLET | Freq: Every day | ORAL | 0 refills | Status: DC

## 2022-02-24 MED ORDER — AZITHROMYCIN 250 MG PO TABS: ORAL_TABLET | ORAL | 0 refills | Status: DC

## 2022-02-24 MED ORDER — POTASSIUM CHLORIDE CRYS ER 20 MEQ PO TBCR
40.0000 meq | EXTENDED_RELEASE_TABLET | Freq: Once | ORAL | Status: AC
  Administered 2022-02-24: 40 meq via ORAL
  Filled 2022-02-24: qty 2

## 2022-02-24 MED ORDER — ALBUTEROL SULFATE HFA 108 (90 BASE) MCG/ACT IN AERS
2.0000 | INHALATION_SPRAY | RESPIRATORY_TRACT | Status: DC | PRN
  Administered 2022-02-24: 2 via RESPIRATORY_TRACT
  Filled 2022-02-24: qty 6.7

## 2022-02-24 MED ORDER — SODIUM CHLORIDE 0.9 % IV SOLN
Freq: Once | INTRAVENOUS | Status: AC
Start: 2022-02-24 — End: 2022-02-24

## 2022-02-24 MED ORDER — AZITHROMYCIN 250 MG PO TABS
500.0000 mg | ORAL_TABLET | Freq: Once | ORAL | Status: AC
  Administered 2022-02-24: 500 mg via ORAL
  Filled 2022-02-24: qty 2

## 2022-02-24 NOTE — ED Triage Notes (Signed)
Pt says that for about 2 weeks she has has not felt well, she has had cough and SOB. Thick green sputum production. Hx of COPD.

## 2022-02-24 NOTE — ED Provider Notes (Incomplete)
Care of patient assumed from Dr. Ilda Basset at 8 AM.  Presenting for shortness of breath secondary to COPD.  She has had improved breathing symptoms.  She is currently getting potassium replacement and will be appropriate for discharge following that Physical Exam  BP 135/68    Pulse (!) 108    Temp 98.3 F (36.8 C) (Oral)    Resp (!) 23    SpO2 95%   Physical Exam  Procedures  Procedures  ED Course / MDM    Medical Decision Making Amount and/or Complexity of Data Reviewed Labs: ordered. Radiology: ordered.  Risk Prescription drug management.   ***

## 2022-02-24 NOTE — ED Provider Notes (Signed)
[facility]-EMERGENCY DEPT Provider Note  CSN: [id number] Arrival date & time: 02/24/22 0330  Chief Complaint(s) Shortness of Breath  HPI Shelly Frazier is a 66 y.o. female with a past medical history listed below including COPD who presents to the emergency department with 1-1/2 weeks of gradually worsening cough and shortness of breath.  Reports she had laryngitis recently that developed into bronchitis.  Reports cough and keeps her up.  She was seen by her PCP earlier in the week and given Tussionex for cough.  Has been taken at home albuterol puffs with minimal relief.  The history is provided by the patient.   Past Medical History Past Medical History:  Diagnosis Date   Anxiety    Bronchitis    Chest pain    COPD (chronic obstructive pulmonary disease) (HCC)    Elevated fasting glucose    GERD (gastroesophageal reflux disease)    Hyperlipemia    Hypertension    Insomnia 04/13/2014   Nausea without vomiting 04/03/2016   Neck pain 04/03/2016   Obesity    Stress incontinence 08/31/2014   Tobacco abuse    Ulnar tunnel syndrome of left wrist 01/12/2018   Patient Active Problem List   Diagnosis Date Noted   Fatigue 09/03/2020   Seasonal allergies 05/31/2018   Headache, occipital 08/20/2015   Obesity, unspecified 08/10/2014   Depression with anxiety 10/27/2013   Tobacco abuse 09/29/2012   COPD with acute exacerbation (HCC) 09/28/2012   GERD (gastroesophageal reflux disease) 03/29/2012   Hyperlipidemia 03/09/2012   Hypertension 03/08/2012   Home Medication(s) Prior to Admission medications   Medication Sig Start Date End Date Taking? Authorizing Provider  azithromycin (ZITHROMAX Z-PAK) 250 MG tablet Take 250 mg once a day for days 2, 3, 4, and 5.  First dose was given in the ER. 02/25/22  Yes Halston Kintz, Amadeo Garnet, MD  HYDROcodone bit-homatropine (HYCODAN) 5-1.5 MG/5ML syrup Take 5 mLs by mouth every 6 (six) hours as needed for up to 5 days for cough. 02/24/22  03/01/22 Yes Chondra Boyde, Amadeo Garnet, MD  potassium chloride (KLOR-CON) 10 MEQ tablet Take 2 tablets (20 mEq total) by mouth daily for 10 days. 02/24/22 03/06/22 Yes Katrice Goel, Amadeo Garnet, MD  predniSONE (DELTASONE) 10 MG tablet Take 4 tablets (40 mg total) by mouth daily for 4 days. 02/24/22 02/28/22 Yes Shakur Lembo, Amadeo Garnet, MD  albuterol (VENTOLIN HFA) 108 (90 Base) MCG/ACT inhaler TAKE 2 PUFFS BY MOUTH EVERY 6 HOURS AS NEEDED FOR WHEEZE OR SHORTNESS OF BREATH 09/03/20   Doreene Eland, MD  aspirin 81 MG tablet Take 81 mg by mouth daily.    [provider]  atorvastatin (LIPITOR) 20 MG tablet TAKE 1 TABLET BY MOUTH EVERYDAY AT BEDTIME 05/01/21   Eniola, Al Decant T, MD  carvedilol (COREG) 6.25 MG tablet TAKE 1 TABLET BY MOUTH (TWO) TIMES DAILY WITH A MEAL. 12/23/20   Doreene Eland, MD  chlorpheniramine-HYDROcodone (TUSSIONEX PENNKINETIC ER) 10-8 MG/5ML SUER Take 5 mLs by mouth every 12 (twelve) hours as needed for cough. 12/26/20   Meccariello, Solmon Ice, DO  dicyclomine (BENTYL) 20 MG tablet TAKE 1 TABLET BY MOUTH TWICE A DAY 09/03/20   Doreene Eland, MD  famotidine (PEPCID) 20 MG tablet Take 1 tablet (20 mg total) by mouth at bedtime. 09/27/20   Doreene Eland, MD  fluticasone (FLONASE) 50 MCG/ACT nasal spray Place 2 sprays into both nostrils daily. 10/03/20   Kathrin Ruddy, RPH-CPP  Fluticasone-Umeclidin-Vilant (TRELEGY ELLIPTA) 100-62.5-25 MCG/INH AEPB Inhale 1  Dose into the lungs daily. 09/17/20   Kathrin Ruddy, RPH-CPP  hydrochlorothiazide (HYDRODIURIL) 25 MG tablet TAKE 1 TABLET BY MOUTH EVERYDAY AT BEDTIME 12/23/20   Doreene Eland, MD  HYDROcodone-acetaminophen (NORCO/VICODIN) 5-325 MG tablet Take 1 tablet by mouth every 6 (six) hours as needed. 08/07/21   Zadie Rhine, MD  ibuprofen (ADVIL,MOTRIN) 200 MG tablet Take 200 mg by mouth every 6 (six) hours as needed for mild pain.  Patient not taking: Reported on 09/27/2020    [provider]  ipratropium (ATROVENT) 0.03  % nasal spray Place 2 sprays into both nostrils every 12 (twelve) hours. 09/27/20   Doreene Eland, MD  levocetirizine (XYZAL) 5 MG tablet TAKE 1 TABLET BY MOUTH EVERY DAY IN THE EVENING 09/03/20   Doreene Eland, MD  levothyroxine (SYNTHROID) 25 MCG tablet TAKE 1 TABLET (25 MCG TOTAL) BY MOUTH EVERY MORNING. 30 MINUTES BEFORE FOOD 05/01/21   Doreene Eland, MD  nitroGLYCERIN (NITROSTAT) 0.4 MG SL tablet Place 1 tablet (0.4 mg total) under the tongue every 5 (five) minutes as needed for chest pain. Do not exceed 3 doses. Patient not taking: Reported on 05/31/2018 12/02/16   Doreene Eland, MD  promethazine (PHENERGAN) 25 MG tablet TAKE 1 TABLET (25 MG TOTAL) BY MOUTH EVERY 8 (EIGHT) HOURS AS NEEDED FOR NAUSEA OR VOMITING. 11/13/20   Doreene Eland, MD  venlafaxine XR (EFFEXOR-XR) 150 MG 24 hr capsule TAKE 1 CAPSULE BY MOUTH EVERY DAY 03/17/21   Doreene Eland, MD  Zoster Vaccine Adjuvanted [facility]) injection Give one dose now and the second dose 2-6 months after the first dose. Patient not taking: Reported on 09/17/2020 09/03/20   Doreene Eland, MD                                                                                                                                    Allergies Naproxen, Aspirin, and Codeine  Review of Systems Review of Systems As noted in HPI  Physical Exam Vital Signs  I have reviewed the triage vital signs BP (!) 111/55    Pulse 89    Temp 98.3 F (36.8 C) (Oral)    Resp 11    SpO2 96%   Physical Exam Vitals reviewed.  Constitutional:      General: She is not in acute distress.    Appearance: She is well-developed. She is not diaphoretic.  HENT:     Head: Normocephalic and atraumatic.     Comments: Raspy voice. Speaking in full sentences    Nose: Nose normal.  Eyes:     General: No scleral icterus.       Right eye: No discharge.        Left eye: No discharge.     Conjunctiva/sclera: Conjunctivae normal.     Pupils: Pupils are equal,  round, and reactive to light.  Cardiovascular:     Rate and Rhythm:  Normal rate and regular rhythm.     Heart sounds: No murmur heard.   No friction rub. No gallop.  Pulmonary:     Effort: Pulmonary effort is normal. Prolonged expiration present. No respiratory distress.     Breath sounds: Normal breath sounds. No stridor. No rales.  Abdominal:     General: There is no distension.     Palpations: Abdomen is soft.     Tenderness: There is no abdominal tenderness.  Musculoskeletal:        General: No tenderness.     Cervical back: Normal range of motion and neck supple.  Skin:    General: Skin is warm and dry.     Findings: No erythema or rash.  Neurological:     Mental Status: She is alert and oriented to person, place, and time.    ED Results and Treatments Labs (all labs ordered are listed, but only abnormal results are displayed) Labs Reviewed  BASIC METABOLIC PANEL - Abnormal; Notable for the following components:      Result Value   Sodium 134 (*)    Potassium 2.3 (*)    Chloride 95 (*)    Glucose, Bld 104 (*)    Calcium 8.6 (*)    All other components within normal limits  CBC - Abnormal; Notable for the following components:   WBC 10.9 (*)    All other components within normal limits                                                                                                                         EKG  EKG Interpretation  Date/Time:  Tuesday February 24 2022 03:41:06 EST Ventricular Rate:  85 PR Interval:  182 QRS Duration: 91 QT Interval:  401 QTC Calculation: 477 R Axis:   63 Text Interpretation: Sinus rhythm Low voltage, precordial leads Baseline wander in lead(s) V3 Confirmed by Drema Pryardama, Aleighya Mcanelly [phone]([id number]) on 02/24/2022 7:29:58 AM       Radiology DG Chest 2 View  Result Date: 02/24/2022 CLINICAL DATA:  Shortness of breath, cough, COPD. EXAM: CHEST - 2 VIEW COMPARISON:  01/03/2022 FINDINGS: The heart size and mediastinal contours are within normal  limits. There is mild atherosclerotic calcification of the aorta. Apical pleural thickening is noted bilaterally. There is hyperinflation of the lungs with increased AP diameter of the chest, compatible with chronic obstructive pulmonary disease. No consolidation, effusion, or pneumothorax. Mild atelectasis is noted at the lung bases bilaterally. No acute osseous abnormality. IMPRESSION: 1. Mild atelectasis at the lung bases. 2. Chronic obstructive pulmonary disease. Electronically Signed   By: Thornell SartoriusLaura  Taylor M.D.   On: 02/24/2022 04:16    Pertinent labs & imaging results that were available during my care of the patient were reviewed by me and considered in my medical decision making (see MDM for details).  Medications Ordered in ED Medications  albuterol (VENTOLIN HFA) 108 (90 Base) MCG/ACT inhaler 2 puff (2 puffs Inhalation Given 02/24/22 0658)  predniSONE (  DELTASONE) tablet 60 mg (60 mg Oral Given 02/24/22 0645)  ipratropium-albuterol (DUONEB) 0.5-2.5 (3) MG/3ML nebulizer solution 3 mL (3 mLs Nebulization Given 02/24/22 0657)  potassium chloride SA (KLOR-CON M) CR tablet 40 mEq (40 mEq Oral Given 02/24/22 0645)  potassium chloride 10 mEq in 100 mL IVPB (10 mEq Intravenous New Bag/Given 02/24/22 0655)  0.9 %  sodium chloride infusion ( Intravenous New Bag/Given 02/24/22 0653)  azithromycin (ZITHROMAX) tablet 500 mg (500 mg Oral Given 02/24/22 0644)                                                                                                                                     Procedures Procedures  (including critical care time)  Medical Decision Making / ED Course         Gradual shortness of breath. Consistent with bronchitis secondary to COPD. We will get screening labs and x-ray to rule out pneumonia, pneumothorax, or pleural effusions. Given the gradual onset have low suspicion for pulmonary embolism.   Work-up ordered to assess concerns above.  Labs and imaging independently  interpreted by me and noted below: CBC without leukocytosis or anemia Metabolic panel notable for hypokalemia 2.3 likely from HCTZ use and shift from albuterol use. EKG without significant interval changes, or dysrhythmias. Chest x-ray without evidence of pneumonia, pneumothorax or pulmonary edema  Management: Provided with DuoNeb Oral steroids First dose of azithromycin IV and oral potassium repletion   Reassessment: Shortness of breath improved.   Final Clinical Impression(s) / ED Diagnoses Final diagnoses:  COPD exacerbation (HCC)  Hypokalemia   The patient appears reasonably screened and/or stabilized for discharge and I doubt any other medical condition or other [facility] requiring further screening, evaluation, or treatment in the ED at this time prior to discharge. Safe for discharge with strict return precautions.  Disposition: Discharge  Condition: Good  I have discussed the results, Dx and Tx plan with the patient/family who expressed understanding and agree(s) with the plan. Discharge instructions discussed at length. The patient/family was given strict return precautions who verbalized understanding of the instructions. No further questions at time of discharge.    ED Discharge Orders          Ordered    azithromycin (ZITHROMAX Z-PAK) 250 MG tablet        02/24/22 0823    potassium chloride (KLOR-CON) 10 MEQ tablet  Daily        02/24/22 0823    HYDROcodone bit-homatropine (HYCODAN) 5-1.5 MG/5ML syrup  Every 6 hours PRN        02/24/22 0823    predniSONE (DELTASONE) 10 MG tablet  Daily        02/24/22 0823             Follow Up: Melida QuitterWile, Laura H, MD [address] [place] [address] [phone]  Call  to schedule an appointment for close follow up, in 5-7 days  This chart was dictated using voice recognition software.  Despite best efforts to proofread,  errors can occur which can change the documentation meaning.    Nira Conn, MD 02/24/22 [phone]

## 2022-03-27 ENCOUNTER — Ambulatory Visit: Payer: Medicare Other | Admitting: Family Medicine

## 2022-09-25 ENCOUNTER — Other Ambulatory Visit: Payer: Self-pay | Admitting: Internal Medicine

## 2022-09-25 DIAGNOSIS — Z720 Tobacco use: Secondary | ICD-10-CM

## 2022-12-16 ENCOUNTER — Encounter (HOSPITAL_BASED_OUTPATIENT_CLINIC_OR_DEPARTMENT_OTHER): Payer: Self-pay | Admitting: Orthopedic Surgery

## 2022-12-17 ENCOUNTER — Other Ambulatory Visit: Payer: Self-pay

## 2022-12-17 ENCOUNTER — Ambulatory Visit (HOSPITAL_BASED_OUTPATIENT_CLINIC_OR_DEPARTMENT_OTHER): Payer: Medicare Other | Admitting: Anesthesiology

## 2022-12-17 ENCOUNTER — Encounter (HOSPITAL_BASED_OUTPATIENT_CLINIC_OR_DEPARTMENT_OTHER): Payer: Self-pay | Admitting: Orthopedic Surgery

## 2022-12-17 ENCOUNTER — Ambulatory Visit (HOSPITAL_BASED_OUTPATIENT_CLINIC_OR_DEPARTMENT_OTHER): Payer: Medicare Other

## 2022-12-17 ENCOUNTER — Encounter (HOSPITAL_BASED_OUTPATIENT_CLINIC_OR_DEPARTMENT_OTHER)
Admission: RE | Disposition: A | Discharge status: Home / Self Care | Payer: Self-pay | Source: Home / Self Care | Attending: Orthopedic Surgery

## 2022-12-17 ENCOUNTER — Ambulatory Visit (HOSPITAL_BASED_OUTPATIENT_CLINIC_OR_DEPARTMENT_OTHER)
Admission: RE | Admit: 2022-12-17 | Discharge: 2022-12-17 | Disposition: A | Discharge status: Home / Self Care | Payer: Medicare Other | Attending: Orthopedic Surgery | Admitting: Orthopedic Surgery

## 2022-12-17 DIAGNOSIS — S82841A Displaced bimalleolar fracture of right lower leg, initial encounter for closed fracture: Secondary | ICD-10-CM | POA: Insufficient documentation

## 2022-12-17 DIAGNOSIS — I10 Essential (primary) hypertension: Secondary | ICD-10-CM | POA: Insufficient documentation

## 2022-12-17 DIAGNOSIS — E039 Hypothyroidism, unspecified: Secondary | ICD-10-CM | POA: Insufficient documentation

## 2022-12-17 DIAGNOSIS — K219 Gastro-esophageal reflux disease without esophagitis: Secondary | ICD-10-CM | POA: Insufficient documentation

## 2022-12-17 DIAGNOSIS — J449 Chronic obstructive pulmonary disease, unspecified: Secondary | ICD-10-CM | POA: Insufficient documentation

## 2022-12-17 DIAGNOSIS — F17210 Nicotine dependence, cigarettes, uncomplicated: Secondary | ICD-10-CM

## 2022-12-17 DIAGNOSIS — F32A Depression, unspecified: Secondary | ICD-10-CM | POA: Diagnosis not present

## 2022-12-17 DIAGNOSIS — F419 Anxiety disorder, unspecified: Secondary | ICD-10-CM | POA: Insufficient documentation

## 2022-12-17 DIAGNOSIS — R519 Headache, unspecified: Secondary | ICD-10-CM | POA: Insufficient documentation

## 2022-12-17 DIAGNOSIS — Z01818 Encounter for other preprocedural examination: Secondary | ICD-10-CM

## 2022-12-17 DIAGNOSIS — W19XXXA Unspecified fall, initial encounter: Secondary | ICD-10-CM | POA: Diagnosis not present

## 2022-12-17 HISTORY — PX: ORIF ANKLE FRACTURE: SHX5408

## 2022-12-17 LAB — BASIC METABOLIC PANEL
Anion gap: 12 (ref 5–15)
BUN: 8 mg/dL (ref 8–23)
CO2: 26 mmol/L (ref 22–32)
Calcium: 9.5 mg/dL (ref 8.9–10.3)
Chloride: 100 mmol/L (ref 98–111)
Creatinine, Ser: 0.76 mg/dL (ref 0.44–1.00)
GFR, Estimated: >60 mL/min (ref >60)
Glucose, Bld: 115 mg/dL — ABNORMAL HIGH (ref 70–99)
Potassium: 2.8 mmol/L — ABNORMAL LOW (ref 3.5–5.1)
Sodium: 138 mmol/L (ref 135–145)

## 2022-12-17 SURGERY — OPEN REDUCTION INTERNAL FIXATION (ORIF) ANKLE FRACTURE
Anesthesia: Regional | Site: Ankle | Laterality: Right

## 2022-12-17 MED ORDER — OXYCODONE HCL 5 MG PO TABS: 5.0000 mg | ORAL_TABLET | Freq: Once | ORAL | Status: DC | PRN

## 2022-12-17 MED ORDER — 0.9 % SODIUM CHLORIDE (POUR BTL) OPTIME
TOPICAL | Status: DC | PRN
  Administered 2022-12-17: 200 mL

## 2022-12-17 MED ORDER — MIDAZOLAM HCL 2 MG/2ML IJ SOLN
INTRAMUSCULAR | Status: AC
  Filled 2022-12-17: qty 2

## 2022-12-17 MED ORDER — RIVAROXABAN 10 MG PO TABS: 10.0000 mg | ORAL_TABLET | Freq: Every day | ORAL | 0 refills | Status: DC

## 2022-12-17 MED ORDER — FENTANYL CITRATE (PF) 100 MCG/2ML IJ SOLN
100.0000 ug | Freq: Once | INTRAMUSCULAR | Status: AC
  Administered 2022-12-17: 50 ug via INTRAVENOUS

## 2022-12-17 MED ORDER — VANCOMYCIN HCL 500 MG IV SOLR
INTRAVENOUS | Status: AC
  Filled 2022-12-17: qty 10

## 2022-12-17 MED ORDER — LACTATED RINGERS IV SOLN: INTRAVENOUS | Status: DC

## 2022-12-17 MED ORDER — ROPIVACAINE HCL 5 MG/ML IJ SOLN
INTRAMUSCULAR | Status: DC | PRN
  Administered 2022-12-17: 40 mL via PERINEURAL

## 2022-12-17 MED ORDER — FENTANYL CITRATE (PF) 100 MCG/2ML IJ SOLN
INTRAMUSCULAR | Status: AC
  Filled 2022-12-17: qty 2

## 2022-12-17 MED ORDER — PROPOFOL 10 MG/ML IV BOLUS
INTRAVENOUS | Status: DC | PRN
  Administered 2022-12-17: 150 mg via INTRAVENOUS

## 2022-12-17 MED ORDER — ACETAMINOPHEN 500 MG PO TABS
ORAL_TABLET | ORAL | Status: AC
  Filled 2022-12-17: qty 2

## 2022-12-17 MED ORDER — MIDAZOLAM HCL 2 MG/2ML IJ SOLN
2.0000 mg | Freq: Once | INTRAMUSCULAR | Status: AC
  Administered 2022-12-17: 2 mg via INTRAVENOUS

## 2022-12-17 MED ORDER — DEXAMETHASONE SODIUM PHOSPHATE 10 MG/ML IJ SOLN
INTRAMUSCULAR | Status: DC | PRN
  Administered 2022-12-17: 10 mg

## 2022-12-17 MED ORDER — AMISULPRIDE (ANTIEMETIC) 5 MG/2ML IV SOLN
INTRAVENOUS | Status: AC
  Filled 2022-12-17: qty 4

## 2022-12-17 MED ORDER — PHENYLEPHRINE HCL (PRESSORS) 10 MG/ML IV SOLN
INTRAVENOUS | Status: DC | PRN
  Administered 2022-12-17 (×2): 80 ug via INTRAVENOUS

## 2022-12-17 MED ORDER — OXYCODONE HCL 5 MG PO TABS: 5.0000 mg | ORAL_TABLET | Freq: Four times a day (QID) | ORAL | 0 refills | Status: AC | PRN

## 2022-12-17 MED ORDER — LIDOCAINE HCL (CARDIAC) PF 100 MG/5ML IV SOSY
PREFILLED_SYRINGE | INTRAVENOUS | Status: DC | PRN
  Administered 2022-12-17: 40 mg via INTRAVENOUS

## 2022-12-17 MED ORDER — IBUPROFEN 800 MG PO TABS: 800.0000 mg | ORAL_TABLET | Freq: Three times a day (TID) | ORAL | 0 refills | Status: AC

## 2022-12-17 MED ORDER — DEXAMETHASONE SODIUM PHOSPHATE 10 MG/ML IJ SOLN
INTRAMUSCULAR | Status: DC | PRN
  Administered 2022-12-17: 5 mg via INTRAVENOUS

## 2022-12-17 MED ORDER — EPHEDRINE SULFATE (PRESSORS) 50 MG/ML IJ SOLN
INTRAMUSCULAR | Status: DC | PRN
  Administered 2022-12-17: 10 mg via INTRAVENOUS
  Administered 2022-12-17: 5 mg via INTRAVENOUS
  Administered 2022-12-17: 10 mg via INTRAVENOUS

## 2022-12-17 MED ORDER — ONDANSETRON HCL 4 MG/2ML IJ SOLN
INTRAMUSCULAR | Status: DC | PRN
  Administered 2022-12-17: 4 mg via INTRAVENOUS

## 2022-12-17 MED ORDER — LIDOCAINE 2% (20 MG/ML) 5 ML SYRINGE
INTRAMUSCULAR | Status: AC
  Filled 2022-12-17: qty 5

## 2022-12-17 MED ORDER — CEFAZOLIN SODIUM-DEXTROSE 2-3 GM-%(50ML) IV SOLR
INTRAVENOUS | Status: DC | PRN
  Administered 2022-12-17: 2 g via INTRAVENOUS

## 2022-12-17 MED ORDER — ACETAMINOPHEN 500 MG PO TABS
1000.0000 mg | ORAL_TABLET | Freq: Once | ORAL | Status: AC
  Administered 2022-12-17: 1000 mg via ORAL

## 2022-12-17 MED ORDER — CEFAZOLIN SODIUM-DEXTROSE 2-4 GM/100ML-% IV SOLN
INTRAVENOUS | Status: AC
  Filled 2022-12-17: qty 100

## 2022-12-17 MED ORDER — HYDROMORPHONE HCL 1 MG/ML IJ SOLN: 0.2500 mg | INTRAMUSCULAR | Status: DC | PRN

## 2022-12-17 MED ORDER — AMISULPRIDE (ANTIEMETIC) 5 MG/2ML IV SOLN
10.0000 mg | Freq: Once | INTRAVENOUS | Status: AC | PRN
  Administered 2022-12-17: 10 mg via INTRAVENOUS

## 2022-12-17 MED ORDER — DEXAMETHASONE SODIUM PHOSPHATE 10 MG/ML IJ SOLN
INTRAMUSCULAR | Status: AC
  Filled 2022-12-17: qty 1

## 2022-12-17 MED ORDER — ONDANSETRON HCL 4 MG/2ML IJ SOLN: 4.0000 mg | Freq: Once | INTRAMUSCULAR | Status: DC | PRN

## 2022-12-17 MED ORDER — VANCOMYCIN HCL 500 MG IV SOLR
INTRAVENOUS | Status: DC | PRN
  Administered 2022-12-17: 500 mg via TOPICAL

## 2022-12-17 MED ORDER — OXYCODONE HCL 5 MG/5ML PO SOLN: 5.0000 mg | Freq: Once | ORAL | Status: DC | PRN

## 2022-12-17 MED ORDER — ONDANSETRON HCL 4 MG/2ML IJ SOLN
INTRAMUSCULAR | Status: AC
  Filled 2022-12-17: qty 2

## 2022-12-17 SURGICAL SUPPLY — 72 items
APL PRP STRL LF DISP 70% ISPRP (MISCELLANEOUS) ×1
BANDAGE ESMARK 6X9 LF (GAUZE/BANDAGES/DRESSINGS) IMPLANT
BIT DRILL 2.5X2.75 QC CALB (BIT) IMPLANT
BIT DRILL 2.9 CANN QC NONSTRL (BIT) IMPLANT
BIT DRILL 3.5X5.5 QC CALB (BIT) IMPLANT
BLADE SURG 15 STRL LF DISP TIS (BLADE) ×2 IMPLANT
BLADE SURG 15 STRL SS (BLADE) ×2
BNDG CMPR 9X4 STRL LF SNTH (GAUZE/BANDAGES/DRESSINGS)
BNDG CMPR 9X6 STRL LF SNTH (GAUZE/BANDAGES/DRESSINGS)
BNDG ELASTIC 4X5.8 VLCR STR LF (GAUZE/BANDAGES/DRESSINGS) ×1 IMPLANT
BNDG ELASTIC 6X5.8 VLCR STR LF (GAUZE/BANDAGES/DRESSINGS) ×1 IMPLANT
BNDG ESMARK 4X9 LF (GAUZE/BANDAGES/DRESSINGS) IMPLANT
BNDG ESMARK 6X9 LF (GAUZE/BANDAGES/DRESSINGS)
CANISTER SUCT 1200ML W/VALVE (MISCELLANEOUS) ×1 IMPLANT
CHLORAPREP W/TINT 26 (MISCELLANEOUS) ×1 IMPLANT
COVER BACK TABLE 60X90IN (DRAPES) ×1 IMPLANT
CUFF TOURN SGL QUICK 34 (TOURNIQUET CUFF)
CUFF TRNQT CYL 34X4.125X (TOURNIQUET CUFF) IMPLANT
DRAPE EXTREMITY T 121X128X90 (DISPOSABLE) ×1 IMPLANT
DRAPE OEC MINIVIEW 54X84 (DRAPES) ×1 IMPLANT
DRAPE U-SHAPE 47X51 STRL (DRAPES) ×1 IMPLANT
DRSG MEPITEL 4X7.2 (GAUZE/BANDAGES/DRESSINGS) ×1 IMPLANT
ELECT REM PT RETURN 9FT ADLT (ELECTROSURGICAL) ×1
ELECTRODE REM PT RTRN 9FT ADLT (ELECTROSURGICAL) ×1 IMPLANT
GAUZE PAD ABD 8X10 STRL (GAUZE/BANDAGES/DRESSINGS) ×2 IMPLANT
GAUZE SPONGE 4X4 12PLY STRL (GAUZE/BANDAGES/DRESSINGS) ×1 IMPLANT
GLOVE BIO SURGEON STRL SZ8 (GLOVE) ×1 IMPLANT
GLOVE BIOGEL PI IND STRL 8 (GLOVE) ×2 IMPLANT
GLOVE ECLIPSE 8.0 STRL XLNG CF (GLOVE) ×1 IMPLANT
GOWN STRL REUS W/ TWL LRG LVL3 (GOWN DISPOSABLE) ×1 IMPLANT
GOWN STRL REUS W/ TWL XL LVL3 (GOWN DISPOSABLE) ×2 IMPLANT
GOWN STRL REUS W/TWL LRG LVL3 (GOWN DISPOSABLE) ×1
GOWN STRL REUS W/TWL XL LVL3 (GOWN DISPOSABLE) ×2
K-WIRE ACE 1.6X6 (WIRE) ×2
KWIRE ACE 1.6X6 (WIRE) IMPLANT
NEEDLE HYPO 22GX1.5 SAFETY (NEEDLE) IMPLANT
NS IRRIG 1000ML POUR BTL (IV SOLUTION) ×1 IMPLANT
PACK BASIN DAY SURGERY FS (CUSTOM PROCEDURE TRAY) ×1 IMPLANT
PAD CAST 4YDX4 CTTN HI CHSV (CAST SUPPLIES) ×1 IMPLANT
PADDING CAST ABS COTTON 4X4 ST (CAST SUPPLIES) IMPLANT
PADDING CAST COTTON 4X4 STRL (CAST SUPPLIES) ×1
PADDING CAST COTTON 6X4 STRL (CAST SUPPLIES) ×1 IMPLANT
PENCIL SMOKE EVACUATOR (MISCELLANEOUS) ×1 IMPLANT
PLATE ACE 100DEG 8HOLE (Plate) IMPLANT
SANITIZER HAND PURELL FF 515ML (MISCELLANEOUS) ×1 IMPLANT
SCREW ACE CAN 4.0 40M (Screw) IMPLANT
SCREW CORTICAL 3.5MM  20MM (Screw) ×1 IMPLANT
SCREW CORTICAL 3.5MM 14MM (Screw) IMPLANT
SCREW CORTICAL 3.5MM 18MM (Screw) IMPLANT
SCREW CORTICAL 3.5MM 20MM (Screw) IMPLANT
SCREW CORTICAL 3.5MM 24MM (Screw) IMPLANT
SHEET MEDIUM DRAPE 40X70 STRL (DRAPES) ×1 IMPLANT
SLEEVE SCD COMPRESS KNEE MED (STOCKING) ×1 IMPLANT
SPIKE FLUID TRANSFER (MISCELLANEOUS) IMPLANT
SPLINT PLASTER CAST FAST 5X30 (CAST SUPPLIES) ×20 IMPLANT
SPONGE T-LAP 18X18 ~~LOC~~+RFID (SPONGE) ×1 IMPLANT
STOCKINETTE 6  STRL (DRAPES) ×1
STOCKINETTE 6 STRL (DRAPES) ×1 IMPLANT
SUCTION FRAZIER HANDLE 10FR (MISCELLANEOUS) ×1
SUCTION TUBE FRAZIER 10FR DISP (MISCELLANEOUS) ×1 IMPLANT
SUT ETHILON 3 0 PS 1 (SUTURE) ×1 IMPLANT
SUT FIBERWIRE #2 38 T-5 BLUE (SUTURE)
SUT MNCRL AB 3-0 PS2 18 (SUTURE) IMPLANT
SUT VIC AB 2-0 SH 27 (SUTURE) ×1
SUT VIC AB 2-0 SH 27XBRD (SUTURE) ×1 IMPLANT
SUT VICRYL 0 SH 27 (SUTURE) IMPLANT
SUTURE FIBERWR #2 38 T-5 BLUE (SUTURE) IMPLANT
SYR BULB EAR ULCER 3OZ GRN STR (SYRINGE) ×1 IMPLANT
SYR CONTROL 10ML LL (SYRINGE) IMPLANT
TOWEL GREEN STERILE FF (TOWEL DISPOSABLE) ×2 IMPLANT
TUBE CONNECTING 20X1/4 (TUBING) ×1 IMPLANT
UNDERPAD 30X36 HEAVY ABSORB (UNDERPADS AND DIAPERS) ×1 IMPLANT

## 2022-12-17 NOTE — Anesthesia Postprocedure Evaluation (Signed)
Anesthesia Post Note  Patient: Shelly Frazier  Procedure(s) Performed: OPEN REDUCTION INTERNAL FIXATION (ORIF) ANKLE FRACTURE, BIMALLEOLAR (Right: Ankle)     Patient location during evaluation: PACU Anesthesia Type: Regional and General Level of consciousness: awake and alert, oriented and patient cooperative Pain management: pain level controlled Vital Signs Assessment: post-procedure vital signs reviewed and stable Respiratory status: spontaneous breathing, nonlabored ventilation and respiratory function stable Cardiovascular status: blood pressure returned to baseline and stable Postop Assessment: no apparent nausea or vomiting Anesthetic complications: no   No notable events documented.  Last Vitals:  Vitals:   12/17/22 1430 12/17/22 1445  BP: (!) 124/51 (!) 137/57  Pulse: 86 80  Resp: 20 15  Temp:    SpO2: 96% 94%    Last Pain:  Vitals:   12/17/22 1430  TempSrc:   PainSc: 0-No pain                 Lannie Fields

## 2022-12-17 NOTE — Op Note (Signed)
12/17/2022  2:26 PM  PATIENT:  Shelly Frazier  66 y.o. female  PRE-OPERATIVE DIAGNOSIS:  right closed bimalleolar fracture  POST-OPERATIVE DIAGNOSIS:  right closed bimalleolar fracture  Procedure(s):  Open treatment right ankle bimalleolar fracture with internal fixation AP, mortise and lateral xrays of the right ankle  SURGEON:  Toni Arthurs, MD  ASSISTANT: none  ANESTHESIA:   General, regional  EBL:  minimal   TOURNIQUET:   Total Tourniquet Time Documented: Thigh (Right) - 38 minutes Total: Thigh (Right) - 38 minutes  COMPLICATIONS:  None apparent  DISPOSITION:  Extubated, awake and stable to recovery.  INDICATION FOR PROCEDURE: 66 year old female with past medical history significant for smoking complains of right ankle pain for the last 2 weeks.  She fell at home injuring the right ankle.  She presented to the office 2 days ago where x-rays revealed a displaced bimalleolar fracture.  She presents now for surgical treatment of this displaced and unstable right ankle injury.  The risks and benefits of the alternative treatment options have been discussed in detail.  The patient wishes to proceed with surgery and specifically understands risks of bleeding, infection, nerve damage, blood clots, need for additional surgery, amputation and death.   PROCEDURE IN DETAIL:  After pre operative consent was obtained, and the correct operative site was identified, the patient was brought to the operating room and placed supine on the OR table.  Anesthesia was administered.  Pre-operative antibiotics were administered.  A surgical timeout was taken.  The right lower extremity was prepped and draped in standard sterile fashion with a tourniquet around the thigh.  The extremity was elevated, and the tourniquet was inflated to 250 mmHg.  A longitudinal incision was made over the lateral malleolus.  Dissection was carried sharply down through the subcutaneous tissues.  The fracture was cleared of  all hematoma and was irrigated.  It was reduced and held with 2 clamps.  A 3.5 mm fully threaded lag screw was inserted from posterior to anterior across the fracture site.  It was noted to have adequate purchase.  An 8 hole one third tubular plate was then contoured to fit the lateral malleolus.  It was secured distally with 4 unicortical screws and proximally with 3 bicortical screws.  Attention was turned to the medial side of the ankle where an incision was made over the medial malleolus.  Dissection was carried sharply down through the subcutaneous tissues.  The fracture site was cleaned of all hematoma and periosteum.  The fracture was reduced and held with a tenaculum.  2 K wires were passed across the fracture site from the tip of the medial malleolus.  AP and lateral radiographs confirmed appropriate position of both K wires.  The K wires were overdrilled.  4 mm partially-threaded cannulated screws from the [employer] small frag set were inserted.  Both were noted to have adequate purchase.  Final AP, mortise and lateral radiographs confirmed appropriate reduction of the fractures in appropriate position and length of all hardware.  The wounds were irrigated copiously and sprinkled with vancomycin powder.  Subcutaneous tissues were approximated with Vicryl.  Skin incisions were closed with nylon.  Sterile dressings were applied followed by a well-padded short leg splint.  The tourniquet was released after application of the dressings.  The patient was awakened from anesthesia and transported to the recovery room in stable condition.   FOLLOW UP PLAN: Nonweightbearing on the right lower extremity.  Follow-up in the office in 2 weeks for  suture removal.  Xarelto for DVT prophylaxis.  Plan 6 weeks postoperative nonweightbearing immobilization due to the fracture pattern and generally poor quality bone.   RADIOGRAPHS: AP, mortise and lateral radiographs of the right ankle are obtained  intraoperatively.  These show interval reduction and fixation of the medial and lateral malleolus fractures.  Hardware is appropriately positioned and of the appropriate lengths.  No other acute injuries are noted.

## 2022-12-17 NOTE — Anesthesia Procedure Notes (Signed)
Procedure Name: LMA Insertion Date/Time: 12/17/2022 1:19 PM  Performed by: Burna Cash, CRNAPre-anesthesia Checklist: Patient identified, Emergency Drugs available, Suction available and Patient being monitored Patient Re-evaluated:Patient Re-evaluated prior to induction Oxygen Delivery Method: Circle system utilized Preoxygenation: Pre-oxygenation with 100% oxygen Induction Type: IV induction Ventilation: Mask ventilation without difficulty LMA: LMA inserted LMA Size: 4.0 Number of attempts: 1 Airway Equipment and Method: Bite block Placement Confirmation: positive ETCO2 Tube secured with: Tape Dental Injury: Teeth and Oropharynx as per pre-operative assessment

## 2022-12-17 NOTE — Transfer of Care (Signed)
Immediate Anesthesia Transfer of Care Note  Patient: Zuleyka C Wissinger  Procedure(s) Performed: OPEN REDUCTION INTERNAL FIXATION (ORIF) ANKLE FRACTURE, BIMALLEOLAR (Right: Ankle)  Patient Location: PACU  Anesthesia Type:GA combined with regional for post-op pain  Level of Consciousness: awake, alert , and oriented  Airway & Oxygen Therapy: Patient Spontanous Breathing  Post-op Assessment: Report given to RN and Post -op Vital signs reviewed and stable  Post vital signs: Reviewed and stable  Last Vitals:  Vitals Value Taken Time  BP 142/69 12/17/22 1420  Temp    Pulse 89 12/17/22 1421  Resp 17 12/17/22 1421  SpO2 95 % 12/17/22 1421  Vitals shown include unvalidated device data.  Last Pain:  Vitals:   12/17/22 1156  TempSrc: Oral  PainSc:       Patients Stated Pain Goal: 3 (12/17/22 1155)  Complications: No notable events documented.

## 2022-12-17 NOTE — Anesthesia Procedure Notes (Signed)
Anesthesia Regional Block: Adductor canal block   Pre-Anesthetic Checklist: , timeout performed,  Correct Patient, Correct Site, Correct Laterality,  Correct Procedure, Correct Position, site marked,  Risks and benefits discussed,  Surgical consent,  Pre-op evaluation,  At surgeon's request and post-op pain management  Laterality: Right  Prep: Maximum Sterile Barrier Precautions used, chloraprep       Needles:  Injection technique: Single-shot  Needle Type: Echogenic Stimulator Needle     Needle Length: 9cm  Needle Gauge: 22     Additional Needles:   Procedures:,,,, ultrasound used (permanent image in chart),,    Narrative:  Start time: 12/17/2022 12:50 PM End time: 12/17/2022 12:53 PM Injection made incrementally with aspirations every 5 mL.  Performed by: Personally  Anesthesiologist: Lannie Fields, DO  Additional Notes: Monitors applied. No increased pain on injection. No increased resistance to injection. Injection made in 5cc increments. Good needle visualization. Patient tolerated procedure well.

## 2022-12-17 NOTE — Discharge Instructions (Addendum)
Toni Arthurs, MD [facility]  Please read the following information regarding your care after surgery.  Medications  You only need a prescription for the narcotic pain medicine (ex. oxycodone, Percocet, Norco).  All of the other medicines listed below are available over the counter. X Ibuprofen three times  a day for the first 5  days after surgery. X acetominophen (Tylenol) 650 mg every 4-6 hours as you need for minor to moderate pain X oxycodone as prescribed for severe pain  Narcotic pain medicine (ex. oxycodone, Percocet, Vicodin) will cause constipation.  To prevent this problem, take the following medicines while you are taking any pain medicine. X docusate sodium (Colace) 100 mg twice a day X senna (Senokot) 2 tablets twice a day  X To help prevent blood clots, take Xarelto daily for two weeks after surgery.  Then take a baby aspirin (81 mg) twice a day for the following 4 weeks.  You should also get up every hour while you are awake to move around.    Weight Bearing X Do not bear any weight on the operated leg or foot.  Cast / Splint / Dressing X Keep your splint, cast or dressing clean and dry.  Don't put anything (coat hanger, pencil, etc) down inside of it.  If it gets damp, use a hair dryer on the cool setting to dry it.  If it gets soaked, call the office to schedule an appointment for a cast change.  After your dressing, cast or splint is removed; you may shower, but do not soak or scrub the wound.  Allow the water to run over it, and then gently pat it dry.  Swelling It is normal for you to have swelling where you had surgery.  To reduce swelling and pain, keep your toes above your nose for at least 3 days after surgery.  It may be necessary to keep your foot or leg elevated for several weeks.  If it hurts, it should be elevated.  Follow Up Call my office at [phone] when you are discharged from the hospital or surgery center to schedule an appointment to be seen two  weeks after surgery.  Call my office at [phone] if you develop a fever >101.5 F, nausea, vomiting, bleeding from the surgical site or severe pain.     May take Tylenol after 6pm, if needed.    Post Anesthesia Home Care Instructions  Activity: Get plenty of rest for the remainder of the day. A responsible individual must stay with you for 24 hours following the procedure.  For the next 24 hours, DO NOT: -Drive a car -Advertising copywriter -Drink alcoholic beverages -Take any medication unless instructed by your physician -Make any legal decisions or sign important papers.  Meals: Start with liquid foods such as gelatin or soup. Progress to regular foods as tolerated. Avoid greasy, spicy, heavy foods. If nausea and/or vomiting occur, drink only clear liquids until the nausea and/or vomiting subsides. Call your physician if vomiting continues.  Special Instructions/Symptoms: Your throat may feel dry or sore from the anesthesia or the breathing tube placed in your throat during surgery. If this causes discomfort, gargle with warm salt water. The discomfort should disappear within 24 hours.  If you had a scopolamine patch placed behind your ear for the management of post- operative nausea and/or vomiting:  1. The medication in the patch is effective for 72 hours, after which it should be removed.  Wrap patch in a tissue and discard in the trash.  Wash hands thoroughly with soap and water. 2. You may remove the patch earlier than 72 hours if you experience unpleasant side effects which may include dry mouth, dizziness or visual disturbances. 3. Avoid touching the patch. Wash your hands with soap and water after contact with the patch.   Regional Anesthesia Blocks  1. Numbness or the inability to move the "blocked" extremity may last from 3-48 hours after placement. The length of time depends on the medication injected and your individual response to the medication. If the numbness is not  going away after 48 hours, call your surgeon.  2. The extremity that is blocked will need to be protected until the numbness is gone and the  Strength has returned. Because you cannot feel it, you will need to take extra care to avoid injury. Because it may be weak, you may have difficulty moving it or using it. You may not know what position it is in without looking at it while the block is in effect.  3. For blocks in the legs and feet, returning to weight bearing and walking needs to be done carefully. You will need to wait until the numbness is entirely gone and the strength has returned. You should be able to move your leg and foot normally before you try and bear weight or walk. You will need someone to be with you when you first try to ensure you do not fall and possibly risk injury.  4. Bruising and tenderness at the needle site are common side effects and will resolve in a few days.  5. Persistent numbness or new problems with movement should be communicated to the surgeon or the [facility] [phone] [facility] [phone]).

## 2022-12-17 NOTE — H&P (Signed)
Shelly Frazier is an 66 y.o. female.   Chief Complaint: Right ankle pain HPI: 66 year old female with a past medical history significant for smoking fell at home 2 weeks ago injuring her right ankle.  She presented to the office 2 days ago where radiographs revealed a displaced bimalleolar fracture.  She was splinted then and has been nonweightbearing for the last couple of days.  She presents now for operative treatment of this displaced and unstable right ankle fracture.  Past Medical History:  Diagnosis Date   Anxiety    Bronchitis    Chest pain    COPD (chronic obstructive pulmonary disease) (HCC)    Elevated fasting glucose    GERD (gastroesophageal reflux disease)    Hyperlipemia    Hypertension    Insomnia 04/13/2014   Nausea without vomiting 04/03/2016   Neck pain 04/03/2016   Obesity    Stress incontinence 08/31/2014   Tobacco abuse    Ulnar tunnel syndrome of left wrist 01/12/2018    Past Surgical History:  Procedure Laterality Date   ABDOMINAL HYSTERECTOMY     endometrial, heavy bleeding   BACK SURGERY  1978   lumpectomy left breast     benign   reconstructive surgeries on forehead     sinus surgery  1987   TUBAL LIGATION      Family History  Problem Relation Age of Onset   Cancer Mother 78       peritoneal cancer   Heart disease Mother        bypass   Hypertension Mother    Cancer Father 41       lung ca   Depression Sister    Alcohol abuse Sister    Liver disease Sister    Depression Sister    Stroke Maternal Grandmother    Heart disease Maternal Grandmother    Heart disease Maternal Grandfather    Heart disease Paternal Grandmother    Heart disease Paternal Grandfather    Diabetes Neg Hx    Social History:  reports that she has been smoking cigarettes. She has a 10.75 pack-year smoking history. She has never used smokeless tobacco. She reports that she does not drink alcohol and does not use drugs.  Allergies:  Allergies  Allergen Reactions    Naproxen Other (See Comments) and Nausea And Vomiting    "horrible stomach ache" "horrible stomach ache"   Aspirin Nausea Only and Nausea And Vomiting    High Dosage Aspirin High Dosage Aspirin   Codeine Nausea Only    Medications Prior to Admission  Medication Sig Dispense Refill   albuterol (VENTOLIN HFA) 108 (90 Base) MCG/ACT inhaler TAKE 2 PUFFS BY MOUTH EVERY 6 HOURS AS NEEDED FOR WHEEZE OR SHORTNESS OF BREATH 8 g 0   atorvastatin (LIPITOR) 20 MG tablet TAKE 1 TABLET BY MOUTH EVERYDAY AT BEDTIME 90 tablet 1   carvedilol (COREG) 6.25 MG tablet TAKE 1 TABLET BY MOUTH (TWO) TIMES DAILY WITH A MEAL. 180 tablet 1   dicyclomine (BENTYL) 20 MG tablet TAKE 1 TABLET BY MOUTH TWICE A DAY 180 tablet 1   Fluticasone-Umeclidin-Vilant (TRELEGY ELLIPTA) 100-62.5-25 MCG/INH AEPB Inhale 1 Dose into the lungs daily. 1 each 11   hydrochlorothiazide (HYDRODIURIL) 25 MG tablet TAKE 1 TABLET BY MOUTH EVERYDAY AT BEDTIME 90 tablet 1   ibuprofen (ADVIL,MOTRIN) 200 MG tablet Take 200 mg by mouth every 6 (six) hours as needed for mild pain.     levothyroxine (SYNTHROID) 25 MCG tablet TAKE 1 TABLET (25  MCG TOTAL) BY MOUTH EVERY MORNING. 30 MINUTES BEFORE FOOD 90 tablet 1   venlafaxine XR (EFFEXOR-XR) 150 MG 24 hr capsule TAKE 1 CAPSULE BY MOUTH EVERY DAY 90 capsule 1   nitroGLYCERIN (NITROSTAT) 0.4 MG SL tablet Place 1 tablet (0.4 mg total) under the tongue every 5 (five) minutes as needed for chest pain. Do not exceed 3 doses. (Patient not taking: Reported on 05/31/2018) 12 tablet 1    No results found for this or any previous visit (from the past 48 hour(s)). No results found.  Review of Systems no recent fever, chills, nausea, vomiting or changes in her appetite  Blood pressure 129/71, pulse 71, temperature 98.7 F (37.1 C), temperature source Oral, resp. rate 14, height 5\' 5"  (1.651 m), weight 75.9 kg, SpO2 96 %. Physical Exam  Well-nourished well-developed woman in no apparent distress.  Alert and  oriented.  Normal mood and affect.  Gait is nonweightbearing on the right.  Right ankle is splinted.  Toes have brisk capillary refill.  Ecchymosis is resolving.  Intact sensibility to light touch dorsally and plantarly at the forefoot.   Assessment/Plan Right ankle bimalleolar fracture -to the operating room today for open treatment with internal fixation.  The risks and benefits of the alternative treatment options have been discussed in detail.  The patient wishes to proceed with surgery and specifically understands risks of bleeding, infection, nerve damage, blood clots, need for additional surgery, amputation and death.   , MD 2023-01-13, 12:31 PM

## 2022-12-17 NOTE — Progress Notes (Signed)
Assisted Dr. Finucane with right, adductor canal, popliteal, ultrasound guided block. Side rails up, monitors on throughout procedure. See vital signs in flow sheet. Tolerated Procedure well. 

## 2022-12-17 NOTE — Anesthesia Procedure Notes (Signed)
Anesthesia Regional Block: Popliteal block   Pre-Anesthetic Checklist: , timeout performed,  Correct Patient, Correct Site, Correct Laterality,  Correct Procedure, Correct Position, site marked,  Risks and benefits discussed,  Surgical consent,  Pre-op evaluation,  At surgeon's request and post-op pain management  Laterality: Right  Prep: Maximum Sterile Barrier Precautions used, chloraprep       Needles:  Injection technique: Single-shot  Needle Type: Echogenic Stimulator Needle     Needle Length: 9cm  Needle Gauge: 22     Additional Needles:   Procedures:,,,, ultrasound used (permanent image in chart),,    Narrative:  Start time: 12/17/2022 12:48 PM End time: 12/17/2022 12:50 PM Injection made incrementally with aspirations every 5 mL.  Performed by: Personally  Anesthesiologist: Lannie Fields, DO  Additional Notes: Monitors applied. No increased pain on injection. No increased resistance to injection. Injection made in 5cc increments. Good needle visualization. Patient tolerated procedure well.

## 2022-12-17 NOTE — Anesthesia Preprocedure Evaluation (Addendum)
Anesthesia Evaluation  Patient identified by MRN, date of birth, ID band Patient awake    Reviewed: Allergy & Precautions, NPO status , Patient's Chart, lab work & pertinent test results, reviewed documented beta blocker date and time   Airway Mallampati: III  TM Distance: >3 FB Neck ROM: Full    Dental  (+) Edentulous Lower, Edentulous Upper   Pulmonary COPD,  COPD inhaler, Current Smoker and Patient abstained from smoking. 3-4 cigg/wk 2 cigg this AM Uses rescue inhaler a few times a day     + decreased breath sounds      Cardiovascular hypertension (129/71 preop), Pt. on medications and Pt. on home beta blockers Normal cardiovascular exam Rhythm:Regular Rate:Normal     Neuro/Psych  Headaches PSYCHIATRIC DISORDERS Anxiety Depression       GI/Hepatic Neg liver ROS,GERD  Controlled,,  Endo/Other  Hypothyroidism  K 2.8, looks like she was prescribed a potassium supplement in 01/2022 when she was discharged from ED for COPD exacerbation (K2.3 at the time)- per pt has not been taking it, didn't know her potassium was low  Renal/GU negative Renal ROS  negative genitourinary   Musculoskeletal R ankle bimalleolar fx   Abdominal   Peds  Hematology negative hematology ROS (+)   Anesthesia Other Findings   Reproductive/Obstetrics negative OB ROS                             Anesthesia Physical Anesthesia Plan  ASA: 3  Anesthesia Plan: General and Regional   Post-op Pain Management: Regional block* and Tylenol PO (pre-op)*   Induction: Intravenous  PONV Risk Score and Plan: 2 and Ondansetron, Dexamethasone, Midazolam and Treatment may vary due to age or medical condition  Airway Management Planned: LMA  Additional Equipment:   Intra-op Plan:   Post-operative Plan: Extubation in OR  Informed Consent: I have reviewed the patients History and Physical, chart, labs and discussed the procedure  including the risks, benefits and alternatives for the proposed anesthesia with the patient or authorized representative who has indicated his/her understanding and acceptance.     Dental advisory given  Plan Discussed with: CRNA  Anesthesia Plan Comments: (D/w pt needs to see PCP, likely needs chronic potassium supplementation )       Anesthesia Quick Evaluation

## 2022-12-18 ENCOUNTER — Other Ambulatory Visit (HOSPITAL_COMMUNITY): Payer: Self-pay

## 2022-12-18 ENCOUNTER — Encounter (HOSPITAL_BASED_OUTPATIENT_CLINIC_OR_DEPARTMENT_OTHER): Payer: Self-pay | Admitting: Orthopedic Surgery

## 2022-12-18 MED ORDER — OXYCODONE HCL 5 MG PO TABS
5.0000 mg | ORAL_TABLET | ORAL | 0 refills | Status: DC | PRN
  Filled 2022-12-18: qty 30, 5d supply, fill #0

## 2023-05-03 ENCOUNTER — Other Ambulatory Visit: Payer: Self-pay | Admitting: Internal Medicine

## 2023-05-03 DIAGNOSIS — M8589 Other specified disorders of bone density and structure, multiple sites: Secondary | ICD-10-CM

## 2023-06-21 ENCOUNTER — Encounter: Payer: Self-pay | Admitting: *Deleted

## 2023-06-22 ENCOUNTER — Institutional Professional Consult (permissible substitution): Payer: 59 | Admitting: Neurology

## 2023-07-14 ENCOUNTER — Encounter: Payer: Self-pay | Admitting: *Deleted

## 2023-07-15 ENCOUNTER — Ambulatory Visit: Payer: 59 | Admitting: Neurology

## 2023-07-15 ENCOUNTER — Encounter: Payer: Self-pay | Admitting: Neurology

## 2023-07-15 VITALS — BP 156/76 | HR 75 | Ht 65.0 in | Wt 167.0 lb

## 2023-07-15 DIAGNOSIS — J449 Chronic obstructive pulmonary disease, unspecified: Secondary | ICD-10-CM | POA: Diagnosis not present

## 2023-07-15 DIAGNOSIS — F17200 Nicotine dependence, unspecified, uncomplicated: Secondary | ICD-10-CM | POA: Diagnosis not present

## 2023-07-15 DIAGNOSIS — R0683 Snoring: Secondary | ICD-10-CM | POA: Diagnosis not present

## 2023-07-15 DIAGNOSIS — Z9189 Other specified personal risk factors, not elsewhere classified: Secondary | ICD-10-CM | POA: Diagnosis not present

## 2023-07-15 DIAGNOSIS — R351 Nocturia: Secondary | ICD-10-CM

## 2023-07-15 DIAGNOSIS — E663 Overweight: Secondary | ICD-10-CM

## 2023-07-15 NOTE — Patient Instructions (Signed)

## 2023-07-15 NOTE — Progress Notes (Signed)
Subjective:    Patient ID: Shelly Frazier is a 67 y.o. female.  HPI    Huston Foley, MD, PhD [facility] [address], [address] [address] [place], [place] [address]  Dear Dr. Nadene Rubins,  I saw your patient, Shelly Frazier, upon your kind request in my sleep clinic today for initial consultation of her sleep disorder, in particular, concern for underlying obstructive sleep apnea.  The patient is unaccompanied today.  As you know, Shelly Frazier is a 67 year old female with an underlying medical history of hypertension, hyperlipidemia, hypothyroidism, osteopenia, COPD, anxiety, depression, smoking, neck pain, reflux disease, allergies, and overweight state, who reports snoring and excessive daytime somnolence.  Her Epworth sleepiness score is 7 out of 24, fatigue severity score is 58 out of 63.. I reviewed your office note from 04/29/2023.  She lives alone, she has been noted to snore per son, no apneas are reported.  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  She smokes about 3-5 cigarettes per week, working on smoking cessation.  She does not drink any alcohol.  She drinks caffeine in the form of soda, about 500 mL daily on average.  She has 1 cat in the household.  She is not aware of any family history of sleep apnea.  She may go to bed somewhere between 3 AM and 7 AM.  Rise time is usually 9 or 10 hours later.   Her Past Medical History Is Significant For: Past Medical History:  Diagnosis Date   Anxiety    Bronchitis    Chest pain    COPD (chronic obstructive pulmonary disease) (HCC)    Depression    Elevated fasting glucose    GERD (gastroesophageal reflux disease)    Hyperlipemia    Hypertension    Hypothyroidism    Insomnia 04/13/2014   Nausea without vomiting 04/03/2016   Neck pain 04/03/2016   Obesity    Osteopenia    Stress incontinence 08/31/2014   Tobacco abuse     Ulnar tunnel syndrome of left wrist 01/12/2018    Her Past Surgical History Is Significant For: Past Surgical History:  Procedure Laterality Date   ABDOMINAL HYSTERECTOMY     endometrial, heavy bleeding   BACK SURGERY  1978   lumpectomy left breast     benign   ORIF ANKLE FRACTURE Right 12/17/2022   Procedure: OPEN REDUCTION INTERNAL FIXATION (ORIF) ANKLE FRACTURE, BIMALLEOLAR;  Surgeon: Toni Arthurs, MD;  Location: [facility];  Service: Orthopedics;  Laterality: Right;   reconstructive surgeries on forehead     sinus surgery  1987   TUBAL LIGATION      Her Family History Is Significant For: Family History  Problem Relation Age of Onset   Cancer Mother 46       peritoneal cancer   Heart disease Mother        bypass   Hypertension Mother    Cancer Father 26       lung ca   Depression Sister    Alcohol abuse Sister    Liver disease Sister    Depression Sister    Stroke Maternal Grandmother    Heart disease Maternal Grandmother    Heart disease Maternal Grandfather    Heart disease Paternal Grandmother    Heart disease Paternal Grandfather    Diabetes Neg Hx    Sleep  apnea Neg Hx     Her Social History Is Significant For: Social History   Socioeconomic History   Marital status: Single    Spouse name: Not on file   Number of children: 3   Years of education: Not on file   Highest education level: Not on file  Occupational History    Employer: NOT EMPLOYED  Tobacco Use   Smoking status: Light Smoker    Current packs/day: 0.00    Types: Cigarettes    Last attempt to quit: 11/03/2012    Years since quitting: 10.7   Smokeless tobacco: Never  Vaping Use   Vaping status: Never Used  Substance and Sexual Activity   Alcohol use: No   Drug use: No   Sexual activity: Not Currently  Other Topics Concern   Not on file  Social History Narrative   Lives alone.  Divorced.  3 grown sons lives in the area.  Unemployed.  Worked previously in Garment/textile technologist.  Graduated high school.   Social Determinants of Health   Financial Resource Strain: Not on file  Food Insecurity: Not on file  Transportation Needs: Not on file  Physical Activity: Not on file  Stress: Not on file  Social Connections: Not on file    Her Allergies Are:  Allergies  Allergen Reactions   Naproxen Other (See Comments) and Nausea And Vomiting    "horrible stomach ache" "horrible stomach ache"   Aspirin Nausea Only and Nausea And Vomiting    High Dosage Aspirin High Dosage Aspirin   Codeine Nausea Only  :   Her Current Medications Are:  Outpatient Encounter Medications as of 07/15/2023  Medication Sig   albuterol (VENTOLIN HFA) 108 (90 Base) MCG/ACT inhaler TAKE 2 PUFFS BY MOUTH EVERY 6 HOURS AS NEEDED FOR WHEEZE OR SHORTNESS OF BREATH   alendronate (FOSAMAX) 70 MG tablet Take 70 mg by mouth once a week.   atorvastatin (LIPITOR) 40 MG tablet Take 40 mg by mouth daily.   carvedilol (COREG) 6.25 MG tablet TAKE 1 TABLET BY MOUTH (TWO) TIMES DAILY WITH A MEAL.   dicyclomine (BENTYL) 20 MG tablet TAKE 1 TABLET BY MOUTH TWICE A DAY   esomeprazole (NEXIUM) 20 MG capsule Take 20 mg by mouth daily at 12 noon.   Fluticasone-Umeclidin-Vilant (TRELEGY ELLIPTA) 100-62.5-25 MCG/INH AEPB Inhale 1 Dose into the lungs daily.   hydrochlorothiazide (HYDRODIURIL) 25 MG tablet TAKE 1 TABLET BY MOUTH EVERYDAY AT BEDTIME   levothyroxine (SYNTHROID) 50 MCG tablet Take 50 mcg by mouth daily before breakfast.   venlafaxine XR (EFFEXOR-XR) 150 MG 24 hr capsule TAKE 1 CAPSULE BY MOUTH EVERY DAY   nitroGLYCERIN (NITROSTAT) 0.4 MG SL tablet Place 1 tablet (0.4 mg total) under the tongue every 5 (five) minutes as needed for chest pain. Do not exceed 3 doses. (Patient not taking: Reported on 05/31/2018)   ondansetron (ZOFRAN) 4 MG tablet Take 4 mg by mouth daily as needed for nausea or vomiting.   No facility-administered encounter medications on file as of 07/15/2023.  :   Review of  Systems:  Out of a complete 14 point review of systems, all are reviewed and negative with the exception of these symptoms as listed below:  Review of Systems  Neurological:        Pt here for sleep consult Pt snores,fatigue,headaches,hypertension,COPD  Pt denies sleep study,CPAP machine    ESS:7 FSS:58    Objective:  Neurological Exam  Physical Exam Physical Examination:   Vitals:  07/15/23 1117  BP: (!) 156/76  Pulse: 75    General Examination: The patient is a very pleasant 67 y.o. female in no acute distress. She appears well-developed and well-nourished and well groomed.   HEENT: Normocephalic, atraumatic, pupils are equal, round and reactive to light, extraocular tracking is good without limitation to gaze excursion or nystagmus noted. Hearing is grossly intact. Face is symmetric with normal facial animation. Speech is clear with no dysarthria noted. There is no hypophonia. There is no lip, neck/head, jaw or voice tremor. Neck is supple with full range of passive and active motion. There are no carotid bruits on auscultation. Oropharynx exam reveals: moderate mouth dryness, adequate dental hygiene with full dentures, moderate airway crowding secondary to redundant soft palate and small airway, tonsils not fully visualized, tip of uvula not fully visualized, Mallampati class III, neck circumference 15-1/2 inches.  Tongue protrudes centrally and palate elevates symmetrically.   Chest: Clear to auscultation without wheezing, rhonchi or crackles noted.  Heart: S1+S2+0, regular and normal without murmurs, rubs or gallops noted.   Abdomen: Soft, non-tender and non-distended.  Extremities: There is no pitting edema in the distal lower extremities bilaterally.   Skin: Warm and dry without trophic changes noted.   Musculoskeletal: exam reveals no obvious joint deformities, right ankle a little wider than left, unremarkable scar lateral right ankle from ankle surgery in December  2023.   Neurologically:  Mental status: The patient is awake, alert and oriented in all 4 spheres. Her immediate and remote memory, attention, language skills and fund of knowledge are appropriate. There is no evidence of aphasia, agnosia, apraxia or anomia. Speech is clear with normal prosody and enunciation. Thought process is linear. Mood is normal and affect is normal.  Cranial nerves II - XII are as described above under HEENT exam.  Motor exam: Normal bulk, strength and tone is noted. There is no obvious action or resting tremor.  Fine motor skills and coordination: grossly intact.  Cerebellar testing: No dysmetria or intention tremor. There is no truncal or gait ataxia.  Sensory exam: intact to light touch in the upper and lower extremities.  Gait, station and balance: She stands easily. No veering to one side is noted. No leaning to one side is noted. Posture is age-appropriate and stance is narrow based. Gait shows normal stride length and normal pace. No problems turning are noted.   Assessment and Plan:  In summary, Shelly Frazier is a very pleasant 67 y.o.-year old female with an underlying medical history of hypertension, hyperlipidemia, hypothyroidism, osteopenia, COPD, anxiety, depression, smoking, neck pain, reflux disease, allergies, and overweight state, whose history and physical exam are concerning for sleep disordered breathing, particularly obstructive sleep apnea (OSA).  While a laboratory attended sleep study is typically considered "gold standard" for evaluation of sleep disordered breathing, we mutually agreed to pursue a home sleep test at this time, particularly because of her variable sleep schedule and her concern that she would not be able to sleep in the sleep lab.     I had a long chat with the patient about my findings and the diagnosis of sleep apnea, particularly OSA, its prognosis and treatment options. We talked about medical/conservative treatments, surgical  interventions and non-pharmacological approaches for symptom control. I explained, in particular, the risks and ramifications of untreated moderate to severe OSA, especially with respect to developing cardiovascular disease down the road, including congestive heart failure (CHF), difficult to treat hypertension, cardiac arrhythmias (particularly A-fib), neurovascular complications including  TIA, stroke and dementia. Even type 2 diabetes has, in part, been linked to untreated OSA. Symptoms of untreated OSA may include (but may not be limited to) daytime sleepiness, nocturia (i.e. frequent nighttime urination), memory problems, mood irritability and suboptimally controlled or worsening mood disorder such as depression and/or anxiety, lack of energy, lack of motivation, physical discomfort, as well as recurrent headaches, especially morning or nocturnal headaches. We talked about the importance of maintaining a healthy lifestyle and striving for healthy weight.  The importance of complete smoking cessation was also addressed.  In addition, we talked about the importance of striving for and maintaining good sleep hygiene. I recommended a sleep study at this time. I outlined the differences between a laboratory attended sleep study which is considered more comprehensive and accurate over the option of a home sleep test (HST); the latter may lead to underestimation of sleep disordered breathing in some instances and does not help with diagnosing upper airway resistance syndrome and is not accurate enough to diagnose primary central sleep apnea typically. I outlined possible surgical and non-surgical treatment options of OSA, including the use of a positive airway pressure (PAP) device (i.e. CPAP, AutoPAP/APAP or BiPAP in certain circumstances), a custom-made dental device (aka oral appliance, which would require a referral to a specialist dentist or orthodontist typically, and is generally speaking not considered for  patients with full dentures or edentulous state), upper airway surgical options, such as traditional UPPP (which is not considered a first-line treatment) or the Inspire device (hypoglossal nerve stimulator, which would involve a referral for consultation with an ENT surgeon, after careful selection, following inclusion criteria - also not first-line treatment). I explained the PAP treatment option to the patient in detail, as this is generally considered first-line treatment.  The patient indicated that she would be willing to try PAP therapy, if the need arises. I explained the importance of being compliant with PAP treatment, not only for insurance purposes but primarily to improve patient's symptoms symptoms, and for the patient's long term health benefit, including to reduce Her cardiovascular risks longer-term.    We will pick up our discussion about the next steps and treatment options after testing.  We will keep her posted as to the test results by phone call and/or MyChart messaging where possible.  We will plan to follow-up in sleep clinic accordingly as well.  I answered all her questions today and the patient was in agreement.   I encouraged her to call with any interim questions, concerns, problems or updates or email [place] through MyChart.  Generally speaking, sleep test authorizations may take up to 2 weeks, sometimes less, sometimes longer, the patient is encouraged to get in touch with [place] if they do not hear back from the sleep lab staff directly within the next 2 weeks.  Thank you very much for allowing me to participate in the care of this nice patient. If I can be of any further assistance to you please do not hesitate to call me at [phone].  Sincerely,   Huston Foley, MD, PhD

## 2023-11-22 ENCOUNTER — Other Ambulatory Visit: Payer: Medicare Other

## 2023-11-22 ENCOUNTER — Other Ambulatory Visit: Payer: Self-pay | Admitting: Internal Medicine

## 2023-11-22 DIAGNOSIS — M8589 Other specified disorders of bone density and structure, multiple sites: Secondary | ICD-10-CM

## 2024-04-14 ENCOUNTER — Ambulatory Visit (HOSPITAL_COMMUNITY)
Admission: EM | Admit: 2024-04-14 | Discharge: 2024-04-14 | Disposition: A | Discharge status: Home / Self Care | Attending: Nurse Practitioner | Admitting: Nurse Practitioner

## 2024-04-14 ENCOUNTER — Encounter (HOSPITAL_COMMUNITY): Payer: Self-pay

## 2024-04-14 DIAGNOSIS — J449 Chronic obstructive pulmonary disease, unspecified: Secondary | ICD-10-CM | POA: Diagnosis not present

## 2024-04-14 MED ORDER — PROMETHAZINE-DM 6.25-15 MG/5ML PO SYRP: 5.0000 mL | ORAL_SOLUTION | Freq: Four times a day (QID) | ORAL | 0 refills | Status: AC | PRN

## 2024-04-14 MED ORDER — AZITHROMYCIN 250 MG PO TABS: 250.0000 mg | ORAL_TABLET | Freq: Every day | ORAL | 0 refills | Status: AC

## 2024-04-14 MED ORDER — PREDNISONE 10 MG (21) PO TBPK: ORAL_TABLET | ORAL | 0 refills | Status: AC

## 2024-04-14 NOTE — ED Triage Notes (Signed)
 Chief Complaint: cough, SOB, congestion. Patient has history of bronchitis and COPD.   Sick exposure: No  Onset: yesterday  Prescriptions or OTC medications tried: Yes- Ibuprofen , Vicks cough syrup    with no relief  New foods, medications, or products: No  Recent Travel: No

## 2024-04-14 NOTE — ED Provider Notes (Signed)
 [facility]    CSN: [id number] Arrival date & time: 04/14/24  1914      History   Chief Complaint Chief Complaint  Patient presents with   Cough    HPI Shelly Frazier is a 68 y.o. female.   HPI  She is in today complaining of cough.  She endorses she has a history of bronchitis and COPD.  She reports that she gets bronchitis every few months.  She does continue to smoke.  She endorses that she lives on the third floor and has had her door open for her cat..  She endorses that the cough started yesterday. Her previous episode of bronchitis led to pneumonia.  She endorses that she normally gets an antibiotic, Z-Pak along with prednisone  and Tussionex.  She endorses that she did not sleep well last night due to the cough.  She denies any fever, chills,, headache, dizziness Past Medical History:  Diagnosis Date   Anxiety    Bronchitis    Chest pain    COPD (chronic obstructive pulmonary disease) (HCC)    Depression    Elevated fasting glucose    GERD (gastroesophageal reflux disease)    Hyperlipemia    Hypertension    Hypothyroidism    Insomnia 04/13/2014   Nausea without vomiting 04/03/2016   Neck pain 04/03/2016   Obesity    Osteopenia    Stress incontinence 08/31/2014   Tobacco abuse    Ulnar tunnel syndrome of left wrist 01/12/2018    Patient Active Problem List   Diagnosis Date Noted   Fatigue 09/03/2020   Seasonal allergies 05/31/2018   Headache, occipital 08/20/2015   Obesity, unspecified 08/10/2014   Depression with anxiety 10/27/2013   Tobacco abuse 09/29/2012   COPD with acute exacerbation (HCC) 09/28/2012   GERD (gastroesophageal reflux disease) 03/29/2012   Hyperlipidemia 03/09/2012   Hypertension 03/08/2012    Past Surgical History:  Procedure Laterality Date   ABDOMINAL HYSTERECTOMY     endometrial, heavy bleeding   BACK SURGERY  1978   lumpectomy left breast     benign   ORIF ANKLE FRACTURE Right 12/17/2022   Procedure: OPEN  REDUCTION INTERNAL FIXATION (ORIF) ANKLE FRACTURE, BIMALLEOLAR;  Surgeon: Amada Backer, MD;  Location: [facility];  Service: Orthopedics;  Laterality: Right;   reconstructive surgeries on forehead     sinus surgery  1987   TUBAL LIGATION      OB History   No obstetric history on file.      Home Medications    Prior to Admission medications   Medication Sig Start Date End Date Taking? Authorizing Provider  albuterol  (VENTOLIN  HFA) 108 (90 Base) MCG/ACT inhaler TAKE 2 PUFFS BY MOUTH EVERY 6 HOURS AS NEEDED FOR WHEEZE OR SHORTNESS OF BREATH 09/03/20  Yes Arn Lane, MD  alendronate (FOSAMAX) 70 MG tablet Take 70 mg by mouth once a week.   Yes [provider]  atorvastatin  (LIPITOR) 40 MG tablet Take 40 mg by mouth daily.   Yes [provider]  azithromycin  (ZITHROMAX ) 250 MG tablet Take 1 tablet (250 mg total) by mouth daily. Take first 2 tablets together, then 1 every day until finished. 04/14/24  Yes Yeiden Frenkel, Marcelene Sep, NP  carvedilol  (COREG ) 6.25 MG tablet TAKE 1 TABLET BY MOUTH (TWO) TIMES DAILY WITH A MEAL. 12/23/20  Yes Arn Lane, MD  dicyclomine  (BENTYL ) 20 MG tablet TAKE 1 TABLET BY MOUTH TWICE A DAY 09/03/20  Yes Arn Lane, MD  esomeprazole  (NEXIUM ) 20 MG capsule Take 20 mg by mouth daily at 12 noon.   Yes [provider]  Fluticasone -Umeclidin-Vilant (TRELEGY ELLIPTA ) 100-62.5-25 MCG/INH AEPB Inhale 1 Dose into the lungs daily. 09/17/20  Yes Koval, Peter G, RPH-CPP  hydrochlorothiazide  (HYDRODIURIL ) 25 MG tablet TAKE 1 TABLET BY MOUTH EVERYDAY AT BEDTIME 12/23/20  Yes Arn Lane, MD  levothyroxine  (SYNTHROID ) 50 MCG tablet Take 50 mcg by mouth daily before breakfast.   Yes [provider]  nitroGLYCERIN  (NITROSTAT ) 0.4 MG SL tablet Place 1 tablet (0.4 mg total) under the tongue every 5 (five) minutes as needed for chest pain. Do not exceed 3 doses. 12/02/16  Yes Arn Lane, MD  ondansetron  (ZOFRAN ) 4 MG  tablet Take 4 mg by mouth daily as needed for nausea or vomiting.   Yes [provider]  predniSONE  (STERAPRED UNI-PAK 21 TAB) 10 MG (21) TBPK tablet Take as directed by Dosepak 04/14/24  Yes Gregoria Leas, NP  promethazine -dextromethorphan (PROMETHAZINE -DM) 6.25-15 MG/5ML syrup Take 5 mLs by mouth 4 (four) times daily as needed for cough. 04/14/24  Yes Gregoria Leas, NP  venlafaxine  XR (EFFEXOR -XR) 150 MG 24 hr capsule TAKE 1 CAPSULE BY MOUTH EVERY DAY 03/17/21  Yes Arn Lane, MD    Family History Family History  Problem Relation Age of Onset   Cancer Mother 72       peritoneal cancer   Heart disease Mother        bypass   Hypertension Mother    Cancer Father 54       lung ca   Depression Sister    Alcohol abuse Sister    Liver disease Sister    Depression Sister    Stroke Maternal Grandmother    Heart disease Maternal Grandmother    Heart disease Maternal Grandfather    Heart disease Paternal Grandmother    Heart disease Paternal Grandfather    Diabetes Neg Hx    Sleep apnea Neg Hx     Social History Social History   Tobacco Use   Smoking status: Light Smoker    Current packs/day: 0.00    Types: Cigarettes    Last attempt to quit: 11/03/2012    Years since quitting: 11.4   Smokeless tobacco: Never  Vaping Use   Vaping status: Never Used  Substance Use Topics   Alcohol use: No   Drug use: No     Allergies   Naproxen, Aspirin, and Codeine    Review of Systems Review of Systems   Physical Exam Triage Vital Signs ED Triage Vitals  Encounter Vitals Group     BP 04/14/24 0843 (!) 167/97     Systolic BP Percentile --      Diastolic BP Percentile --      Pulse Rate 04/14/24 0843 96     Resp 04/14/24 0843 18     Temp 04/14/24 0843 98.3 F (36.8 C)     Temp Source 04/14/24 0843 Oral     SpO2 04/14/24 0843 94 %     Weight --      Height --      Head Circumference --      Peak Flow --      Pain Score 04/14/24 0842 0     Pain Loc --       Pain Education --      Exclude from Growth Chart --    No data found.  Updated Vital Signs BP (!) 167/97 (BP Location: Left  Arm)   Pulse 96   Temp 98.3 F (36.8 C) (Oral)   Resp 18   SpO2 94%   Visual Acuity Right Eye Distance:   Left Eye Distance:   Bilateral Distance:    Right Eye Near:   Left Eye Near:    Bilateral Near:     Physical Exam Constitutional:      General: She is not in acute distress.    Appearance: She is obese. She is not ill-appearing, toxic-appearing or diaphoretic.  HENT:     Head: Normocephalic and atraumatic.     Nose: Nose normal.     Mouth/Throat:     Mouth: Mucous membranes are moist.  Cardiovascular:     Rate and Rhythm: Normal rate and regular rhythm.     Pulses: Normal pulses.     Heart sounds: Normal heart sounds.  Pulmonary:     Effort: Pulmonary effort is normal. No respiratory distress.     Breath sounds: No stridor. No wheezing, rhonchi or rales.     Comments: No cough with deep breathing Chest:     Chest wall: No tenderness.  Musculoskeletal:        General: Normal range of motion.  Skin:    General: Skin is warm and dry.     Capillary Refill: Capillary refill takes less than 2 seconds.  Neurological:     General: No focal deficit present.     Mental Status: She is alert and oriented to person, place, and time.  Psychiatric:        Mood and Affect: Mood normal.        Behavior: Behavior normal.      UC Treatments / Results  Labs (all labs ordered are listed, but only abnormal results are displayed) Labs Reviewed - No data to display  EKG   Radiology No results found.  Procedures Procedures (including critical care time)  Medications Ordered in UC Medications - No data to display  Initial Impression / Assessment and Plan / UC Course  I have reviewed the triage vital signs and the nursing notes.  Pertinent labs & imaging results that were available during my care of the patient were reviewed by me and  considered in my medical decision making (see chart for details).     Cough Final Clinical Impressions(s) / UC Diagnoses   Final diagnoses:  Chronic obstructive pulmonary disease, unspecified COPD type [facility])     Discharge Instructions      You have been prescribed empiric treatment of azithromycin  and prednisone  Dosepak along with Promethazine  DM.  As discussed smoking cessation is recommended along with proactive prevention during months when air quality is poor.     ED Prescriptions     Medication Sig Dispense Auth. Provider   azithromycin  (ZITHROMAX ) 250 MG tablet Take 1 tablet (250 mg total) by mouth daily. Take first 2 tablets together, then 1 every day until finished. 6 tablet Gregoria Leas, NP   predniSONE  (STERAPRED UNI-PAK 21 TAB) 10 MG (21) TBPK tablet Take as directed by Dosepak 21 tablet Gregoria Leas, NP   promethazine -dextromethorphan (PROMETHAZINE -DM) 6.25-15 MG/5ML syrup Take 5 mLs by mouth 4 (four) times daily as needed for cough. 118 mL Gregoria Leas, NP      PDMP not reviewed this encounter.   Eleanore Grey [place], [place] 04/14/24 [phone]

## 2024-04-14 NOTE — Discharge Instructions (Addendum)
 You have been prescribed empiric treatment of azithromycin  and prednisone  Dosepak along with Promethazine  DM.  As discussed smoking cessation is recommended along with proactive prevention during months when air quality is poor.

## 2024-07-13 ENCOUNTER — Other Ambulatory Visit: Payer: 59
